# Patient Record
Sex: Male | Born: 1945 | Race: White | Hispanic: No | Marital: Married | Smoking: Former smoker
Health system: Southern US, Community
[De-identification: ages and names within clinical notes are randomized; demographics above are authoritative.]

## PROBLEM LIST (undated history)

## (undated) DIAGNOSIS — F329 Major depressive disorder, single episode, unspecified: Secondary | ICD-10-CM

## (undated) DIAGNOSIS — Z973 Presence of spectacles and contact lenses: Secondary | ICD-10-CM

## (undated) DIAGNOSIS — M1990 Unspecified osteoarthritis, unspecified site: Secondary | ICD-10-CM

## (undated) DIAGNOSIS — G709 Myoneural disorder, unspecified: Secondary | ICD-10-CM

## (undated) DIAGNOSIS — J439 Emphysema, unspecified: Secondary | ICD-10-CM

## (undated) DIAGNOSIS — S82831A Other fracture of upper and lower end of right fibula, initial encounter for closed fracture: Secondary | ICD-10-CM

## (undated) DIAGNOSIS — J45909 Unspecified asthma, uncomplicated: Secondary | ICD-10-CM

## (undated) DIAGNOSIS — S82899A Other fracture of unspecified lower leg, initial encounter for closed fracture: Secondary | ICD-10-CM

## (undated) DIAGNOSIS — F32A Depression, unspecified: Secondary | ICD-10-CM

## (undated) DIAGNOSIS — M4802 Spinal stenosis, cervical region: Secondary | ICD-10-CM

## (undated) DIAGNOSIS — J449 Chronic obstructive pulmonary disease, unspecified: Secondary | ICD-10-CM

## (undated) DIAGNOSIS — F419 Anxiety disorder, unspecified: Secondary | ICD-10-CM

## (undated) DIAGNOSIS — F319 Bipolar disorder, unspecified: Secondary | ICD-10-CM

## (undated) DIAGNOSIS — G500 Trigeminal neuralgia: Secondary | ICD-10-CM

## (undated) HISTORY — DX: Major depressive disorder, single episode, unspecified: F32.9

## (undated) HISTORY — PX: CERVICAL FUSION: SHX112

## (undated) HISTORY — DX: Anxiety disorder, unspecified: F41.9

## (undated) HISTORY — DX: Depression, unspecified: F32.A

## (undated) HISTORY — DX: Myoneural disorder, unspecified: G70.9

## (undated) HISTORY — PX: SPINE SURGERY: SHX786

## (undated) HISTORY — PX: LUMBAR LAMINECTOMY: SHX95

## (undated) HISTORY — DX: Spinal stenosis, cervical region: M48.02

## (undated) HISTORY — DX: Emphysema, unspecified: J43.9

## (undated) HISTORY — PX: COLONOSCOPY: SHX174

## (undated) HISTORY — DX: Unspecified asthma, uncomplicated: J45.909

## (undated) HISTORY — DX: Chronic obstructive pulmonary disease, unspecified: J44.9

---
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
## 1998-09-07 ENCOUNTER — Encounter: Payer: Self-pay | Admitting: Emergency Medicine

## 1998-09-07 ENCOUNTER — Emergency Department (HOSPITAL_COMMUNITY): Admission: EM | Admit: 1998-09-07 | Discharge: 1998-09-07 | Payer: Self-pay | Admitting: Emergency Medicine

## 1998-09-15 ENCOUNTER — Emergency Department (HOSPITAL_COMMUNITY): Admission: EM | Admit: 1998-09-15 | Discharge: 1998-09-15 | Payer: Self-pay | Admitting: Emergency Medicine

## 2003-02-17 ENCOUNTER — Encounter: Payer: Self-pay | Admitting: Neurology

## 2003-02-17 ENCOUNTER — Ambulatory Visit (HOSPITAL_COMMUNITY): Admission: RE | Admit: 2003-02-17 | Discharge: 2003-02-17 | Payer: Self-pay | Admitting: Neurology

## 2005-05-08 ENCOUNTER — Ambulatory Visit (HOSPITAL_COMMUNITY): Admission: RE | Admit: 2005-05-08 | Discharge: 2005-05-09 | Payer: Self-pay | Admitting: Neurosurgery

## 2005-12-13 ENCOUNTER — Encounter: Admission: RE | Admit: 2005-12-13 | Discharge: 2005-12-13 | Payer: Self-pay | Admitting: Emergency Medicine

## 2007-01-02 ENCOUNTER — Encounter: Payer: Self-pay | Admitting: Cardiovascular Disease

## 2008-09-02 ENCOUNTER — Encounter: Admission: RE | Admit: 2008-09-02 | Discharge: 2008-09-02 | Payer: Self-pay | Admitting: Emergency Medicine

## 2008-12-01 ENCOUNTER — Encounter: Payer: Self-pay | Admitting: Cardiovascular Disease

## 2008-12-02 ENCOUNTER — Ambulatory Visit (HOSPITAL_COMMUNITY): Admission: RE | Admit: 2008-12-02 | Discharge: 2008-12-02 | Payer: Self-pay | Admitting: Neurosurgery

## 2009-01-11 ENCOUNTER — Encounter: Payer: Self-pay | Admitting: Cardiovascular Disease

## 2009-01-12 ENCOUNTER — Ambulatory Visit: Payer: Self-pay | Admitting: Cardiovascular Disease

## 2009-01-12 DIAGNOSIS — R9431 Abnormal electrocardiogram [ECG] [EKG]: Secondary | ICD-10-CM | POA: Insufficient documentation

## 2009-01-13 ENCOUNTER — Ambulatory Visit (HOSPITAL_COMMUNITY): Admission: RE | Admit: 2009-01-13 | Discharge: 2009-01-14 | Payer: Self-pay | Admitting: Neurosurgery

## 2009-01-15 DIAGNOSIS — I10 Essential (primary) hypertension: Secondary | ICD-10-CM | POA: Insufficient documentation

## 2009-02-22 ENCOUNTER — Encounter: Admission: RE | Admit: 2009-02-22 | Discharge: 2009-02-22 | Payer: Self-pay | Admitting: Gastroenterology

## 2009-07-08 ENCOUNTER — Encounter: Admission: RE | Admit: 2009-07-08 | Discharge: 2009-07-08 | Payer: Self-pay | Admitting: Emergency Medicine

## 2010-06-11 ENCOUNTER — Encounter: Payer: Self-pay | Admitting: Neurology

## 2010-07-30 ENCOUNTER — Other Ambulatory Visit: Payer: Self-pay | Admitting: Emergency Medicine

## 2010-07-30 ENCOUNTER — Ambulatory Visit
Admission: RE | Admit: 2010-07-30 | Discharge: 2010-07-30 | Disposition: A | Discharge status: Home / Self Care | Payer: 59 | Source: Ambulatory Visit | Attending: Emergency Medicine | Admitting: Emergency Medicine

## 2010-07-30 DIAGNOSIS — S0093XA Contusion of unspecified part of head, initial encounter: Secondary | ICD-10-CM

## 2010-07-30 DIAGNOSIS — R55 Syncope and collapse: Secondary | ICD-10-CM

## 2010-08-25 LAB — DIFFERENTIAL
Basophils Absolute: 0.1 10*3/uL (ref 0.0–0.1)
Basophils Relative: 1 % (ref 0–1)
Eosinophils Relative: 2 % (ref 0–5)
Lymphs Abs: 1.8 10*3/uL (ref 0.7–4.0)
Monocytes Absolute: 0.6 10*3/uL (ref 0.1–1.0)

## 2010-08-25 LAB — CBC
Hemoglobin: 15.8 g/dL (ref 13.0–17.0)
MCHC: 34.9 g/dL (ref 30.0–36.0)
RBC: 4.54 MIL/uL (ref 4.22–5.81)
WBC: 6.1 10*3/uL (ref 4.0–10.5)

## 2010-08-25 LAB — PROTIME-INR
INR: 1.1 (ref 0.00–1.49)
Prothrombin Time: 14.2 seconds (ref 11.6–15.2)

## 2010-08-25 LAB — URINALYSIS, ROUTINE W REFLEX MICROSCOPIC
Ketones, ur: 15 mg/dL — AB
Nitrite: NEGATIVE
Protein, ur: NEGATIVE mg/dL
Urobilinogen, UA: 1 mg/dL (ref 0.0–1.0)
pH: 7 (ref 5.0–8.0)

## 2010-08-25 LAB — COMPREHENSIVE METABOLIC PANEL
ALT: 83 U/L — ABNORMAL HIGH (ref 0–53)
AST: 69 U/L — ABNORMAL HIGH (ref 0–37)
Albumin: 4.3 g/dL (ref 3.5–5.2)
BUN: 8 mg/dL (ref 6–23)
Chloride: 105 mEq/L (ref 96–112)
Creatinine, Ser: 0.83 mg/dL (ref 0.4–1.5)
GFR calc non Af Amer: >60 mL/min (ref >60)
Glucose, Bld: 99 mg/dL (ref 70–99)
Potassium: 4.8 mEq/L (ref 3.5–5.1)
Total Bilirubin: 1.4 mg/dL — ABNORMAL HIGH (ref 0.3–1.2)
Total Protein: 7.3 g/dL (ref 6.0–8.3)

## 2010-08-25 LAB — APTT: aPTT: 26 seconds (ref 24–37)

## 2010-08-26 LAB — DIFFERENTIAL
Basophils Relative: 1 % (ref 0–1)
Lymphocytes Relative: 32 % (ref 12–46)
Lymphs Abs: 2.1 10*3/uL (ref 0.7–4.0)
Monocytes Relative: 8 % (ref 3–12)
Neutro Abs: 3.8 10*3/uL (ref 1.7–7.7)

## 2010-08-26 LAB — CBC
Hemoglobin: 15.4 g/dL (ref 13.0–17.0)
MCV: 98.5 fL (ref 78.0–100.0)
Platelets: 168 10*3/uL (ref 150–400)
WBC: 6.5 10*3/uL (ref 4.0–10.5)

## 2010-08-26 LAB — COMPREHENSIVE METABOLIC PANEL
ALT: 86 U/L — ABNORMAL HIGH (ref 0–53)
AST: 118 U/L — ABNORMAL HIGH (ref 0–37)
Albumin: 4.1 g/dL (ref 3.5–5.2)
Albumin: 4.1 g/dL (ref 3.5–5.2)
Alkaline Phosphatase: 58 U/L (ref 39–117)
BUN: 7 mg/dL (ref 6–23)
BUN: 10 mg/dL (ref 6–23)
CO2: 27 mEq/L (ref 19–32)
Calcium: 9.5 mg/dL (ref 8.4–10.5)
Creatinine, Ser: 0.65 mg/dL (ref 0.4–1.5)
GFR calc Af Amer: >60 mL/min (ref >60)
Glucose, Bld: 106 mg/dL — ABNORMAL HIGH (ref 70–99)
Potassium: 4.3 mEq/L (ref 3.5–5.1)
Sodium: 141 mEq/L (ref 135–145)
Total Bilirubin: 1 mg/dL (ref 0.3–1.2)
Total Protein: 7.2 g/dL (ref 6.0–8.3)

## 2010-08-26 LAB — PROTIME-INR
INR: 1.1 (ref 0.00–1.49)
Prothrombin Time: 14.7 seconds (ref 11.6–15.2)

## 2010-08-26 LAB — URINALYSIS, ROUTINE W REFLEX MICROSCOPIC
Bilirubin Urine: NEGATIVE
Hgb urine dipstick: NEGATIVE
Nitrite: NEGATIVE
Specific Gravity, Urine: 1.029 (ref 1.005–1.030)
Urobilinogen, UA: 1 mg/dL (ref 0.0–1.0)

## 2010-10-02 NOTE — H&P (Signed)
NAMECULVER, FEIGHNER                 ACCOUNT NO.:  [id number]   MEDICAL RECORD NO.:  [id number]          PATIENT TYPE:  OIB   LOCATION:  3006                         FACILITY:  [facility]   PHYSICIAN:  Payton Doughty, M.D.      DATE OF BIRTH:  [birth year]   DATE OF ADMISSION:  01/13/2009  DATE OF DISCHARGE:                              HISTORY & PHYSICAL   ADMISSION DIAGNOSIS:  Spondylosis and spinal stenosis at C3-4 and C4-5.   SURGEON:  Payton Doughty, MD   BODY OF TEXT:  A very nice 65 year old gentleman right-handed who had  prior operation by Dr. Newell Coral on his back who developed pain in his  neck, as well as clumsiness in his hands and cramping of his hands.  MRI  shows spondylosis with cord compression at C3-4 and C4-5.  He was  admitted before, had some difficulties with liver enzymes that has  straightened out now.  He is now admitted for an anterior decompression  and fusion at C3-4 and C4-5.   MEDICAL HISTORY:  Remarkable for trigeminal neuralgia that he uses  Lyrica for an episodic basis.   MEDICINES:  Robaxin 3-4 times a day and Lyrica.   ALLERGIES:  None.   SURGICAL HISTORY:  Lumbar diskectomy by Dr. Newell Coral.   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   FAMILY HISTORY:  Mother is 53, in poor health with osteoporosis.  Father  is deceased.   REVIEW OF SYSTEMS:  Remarkable for wearing glasses, COPD, arm and leg  weakness.  HEENT:  Within normal limits.  He has limited range of motion  in his neck without Lhermitte's.  CHEST:  Clear.  CARDIAC:  Regular rate  and rhythm.  ABDOMEN:  Soft, nontender, no hepatosplenomegaly.  EXTREMITIES:  Without clubbing or cyanosis.  GU:  Deferred.  Peripheral  pulses are good.  NEUROLOGIC:  He is awake, alert and oriented.  Cranial  nerves appear to function normally.  Motor exam is 5/5 in the upper  extremities with possible exception in interossei which was 5-/5,  reflexes are  1 at the biceps and triceps, absent at the brachioradialis.  Hoffman's is mildly positive bilaterally.  Lower extremities do not  demonstrate any myelopathy.   MR shows degenerative changes at C3-4, C4-5 and C5-6.  C5-6 and C6-7  were less affected, on C3-4 there was ample CSF around the cord.  On C5-  6, there was an extensive degenerative change with posteriorly  protruding osteophyte and lateral recess narrowing.   CLINICAL IMPRESSION:  Cervical spondylosis and early myelopathy.  I  think the most offending levels are C4-5 and C5-6 and the plan is for an  anterior decompression and fusion at C4-5 and C5-6.  The risks and  benefits have been discussed with him and he wished to proceed.   .           ______________________________  Payton Doughty, M.D.     MWR/MEDQ  D:  01/13/2009  T:  01/14/2009  Job:  [id number]

## 2010-10-02 NOTE — Op Note (Signed)
NAMECHRISTYAN, Gabriel Richardson                 ACCOUNT NO.:  [id number]   MEDICAL RECORD NO.:  [id number]          PATIENT TYPE:  OIB   LOCATION:  3006                         FACILITY:  [facility]   PHYSICIAN:  Payton Doughty, M.D.      DATE OF BIRTH:  1946-03-05   DATE OF PROCEDURE:  01/13/2009  DATE OF DISCHARGE:                               OPERATIVE REPORT   PREOPERATIVE DIAGNOSIS:  Spondylosis C4-5 and C5-6.   POSTOPERATIVE DIAGNOSIS:  Spondylosis C4-5 and C5-6.   OPERATIVE PROCEDURE:  C4-5 and C5-6 anterior cervical decompression and  fusion with an Aviator plate.   SURGEON:  Payton Doughty, MD.   ANESTHESIA:  General endotracheal.   PREPARATION:  Prepped and draped with alcohol wipe.   COMPLICATIONS:  None.   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  Following shave, prep and drape in the usual sterile fashion,  skin was incised in midline in medial border of sternocleidomastoid  muscle on the left side.  The platysma was identified, elevated,  divided, and undermined.  Sternocleidomastoid was identified, and medial  dissection revealed the carotid artery tracked laterally to the left.  Trachea and esophagus tracked laterally to the right exposing the bones  of the anterior cervical spine.  Marker was placed.  Intraoperative x-  ray obtained to confirm correctness of the level.  Having confirmed  correctness of level, diskectomy was carried out at C4-5 under gross  observation.  The operating microscope was then brought in and we used  microdissection technique to remove the remaining disk and decompress  the anterior epidural space which is where his cord compression was  occurring.  Following complete decompression, an 8-mm bone graft  was  fashioned in the patellar allograft and tapped into place.  At C5-6, the  joint was ankylosed, could be partly opened but the posterior part of it  could not be identified without wrecking the endplates of vertebral  bodies.  Because he did not have much compression there, it was decided  to simply place a bone graft which was done.  A 34-mm plate was then  placed across both levels and held in place with 12-mm screws, two in  C4, two in C5, two in C6.  Intraoperative x-  ray showed good placement of bone graft, plate, and screws.  Successive  layers of 3-0 Vicryl and 4-0 Vicryl were sed to close.  Benzoin and  Steri-Strips were placed, made occlusive with Telfa and OpSite.  The  patient returned to recovery room in good condition.           ______________________________  Payton Doughty, M.D.     MWR/MEDQ  D:  01/13/2009  T:  01/14/2009  Job:  [id number]

## 2010-10-05 NOTE — Op Note (Signed)
Gabriel Richardson, Gabriel Richardson                 ACCOUNT NO.:  [id number]   MEDICAL RECORD NO.:  [id number]          PATIENT TYPE:  OIB   LOCATION:  2550                         FACILITY:  [facility]   PHYSICIAN:  Hewitt Shorts, M.D.DATE OF BIRTH:  Jun 27, 1945   DATE OF PROCEDURE:  05/08/2005  DATE OF DISCHARGE:                                 OPERATIVE REPORT   PREOPERATIVE DIAGNOSIS:  Left L5-S1 lumbar disc herniation, lumbar  spondylosis, lumbar degenerative disc disease, and lumbar radiculopathy.   POSTOPERATIVE DIAGNOSIS:  Left L5-S1 lumbar disc herniation, lumbar spondylosis, lumbar degenerative  disc disease, and lumbar radiculopathy.   PROCEDURE:  Left L5-S1 METRx microdiscectomy with microdissection.   SURGEON:  Hewitt Shorts, M.D.   ASSISTANT:  Stefani Dama, M.D.   ANESTHESIA:  General endotracheal anesthesia.   INDICATIONS FOR PROCEDURE:  The patient is a 65 year old man who presented  with left lumbar radiculopathy with left dorsiflexor and left extensor  hallicis longus weakness.  He was found to have a left L5-S1 lumbar disc  herniation with a fragment that migrated rostrally behind the body of L5  compressing the exiting left L5 nerve root.  The decision was made to  proceed with discectomy.   PROCEDURE:  The patient was brought to the operating room and placed under  general endotracheal anesthesia.  The patient was turned to the prone  position and the lumbar region was prepped with Betadine solution and draped  in a sterile fashion.  Using C-arm fluoroscopic guidance, the approach was  localized with AP and lateral trajectories, then a left paramedian incision  was made and carried down through the subcutaneous tissue to the lumbar  fascia which was incised.  Then, a K-wire was carefully passed with C-arm  fluoroscopic guidance to the L5 lamina.  A series of dissecting tubes were  passed over the K-wire and dissection was carried out after the K-wires were  removed with the tubes over the L5 and S1 lamina.  Once the tubes were fully  sized up to the largest dilator, the retraction tube was passed over that  and secured in a fixed position.  Then, operating microscope was draped and  brought onto the field to provide instrument magnification, illumination,  and visualization and the remainder of the decompression performed using  microdissection and microsurgical technique.  Residual muscle and fascial  tissue was carefully removed exposing the lamina.  The facet on that side  was hypertrophic and then a laminotomy and medial facetectomy was performed  removing both the bone as well as spondylitic overgrowth.  We identified the  ligamentum flavum and this was carefully removed and we identified the  thecal sac.  We then carefully retracted the thecal sac medially and  identified the annulus of the disc and then dissected further rostrally  extending the laminotomy rostrally so that we were able to expose the disc  herniation as well as the L5 nerve root as it exited in the neural foramen.  We then incised the remaining ligament tissue and the free fragment of disc  herniation was carefully mobilized using  a variety of micro-hooks and ball  dissectors.  As the fragment was removed, good decompression of the thecal  sac and nerve root was achieved.  We then performed a laminotomy at the L4-  L5 level to examine the L5 nerve root more proximally.  It exited freely  into the neural foramen.  We did not find any compression at the L4-L5 level  and it was felt that the left L5 nerve root was thoroughly decompressed from  its origin at the thecal sac at the L4-L5 level to more distally within the  L5 nerve root foramen out to the L5-S1 disc level.  The wound was then  irrigated with Bacitracin solution, checked for hemostasis which was  established and confirmed.  We instilled 1 mL of Fentanyl and 40 mg Depo-  Medrol into the epidural space at the  L5-S1 level and then proceeded with  closure.  The deep fascia was closed with interrupted undyed 3-0 Vicryl  suture, the subcutaneous and subcuticular layer was closed with interrupted  inverted undyed 3-0 Vicryl sutures, and the skin was reapproximated with  Dermabond.  The patient tolerated the procedure well.  Estimated blood loss  was 25 mL.  Sponge and needle counts were correct.  Following surgery, the  patient was turned back to the supine position, reversed from the  anesthetic, extubated, and transferred to the recovery room for further  care.      Hewitt Shorts, M.D.  Electronically Signed     RWN/MEDQ  D:  05/08/2005  T:  05/10/2005  Job:  [id number]

## 2011-01-18 ENCOUNTER — Ambulatory Visit
Admission: RE | Admit: 2011-01-18 | Discharge: 2011-01-18 | Disposition: A | Discharge status: Home / Self Care | Payer: 59 | Source: Ambulatory Visit | Attending: Emergency Medicine | Admitting: Emergency Medicine

## 2011-01-18 ENCOUNTER — Other Ambulatory Visit: Payer: Self-pay | Admitting: Emergency Medicine

## 2011-01-18 DIAGNOSIS — R911 Solitary pulmonary nodule: Secondary | ICD-10-CM

## 2011-01-18 MED ORDER — IOHEXOL 300 MG/ML  SOLN
75.0000 mL | Freq: Once | INTRAMUSCULAR | Status: AC | PRN
  Administered 2011-01-18: 75 mL via INTRAVENOUS

## 2012-05-27 ENCOUNTER — Other Ambulatory Visit: Payer: Self-pay | Admitting: Neurology

## 2012-05-27 DIAGNOSIS — R9089 Other abnormal findings on diagnostic imaging of central nervous system: Secondary | ICD-10-CM

## 2012-06-02 ENCOUNTER — Ambulatory Visit
Admission: RE | Admit: 2012-06-02 | Discharge: 2012-06-02 | Disposition: A | Discharge status: Home / Self Care | Payer: 59 | Source: Ambulatory Visit | Attending: Neurology | Admitting: Neurology

## 2012-06-02 VITALS — BP 130/80 | HR 98

## 2012-06-02 DIAGNOSIS — R9089 Other abnormal findings on diagnostic imaging of central nervous system: Secondary | ICD-10-CM

## 2012-06-02 LAB — CSF CELL COUNT WITH DIFFERENTIAL
RBC Count, CSF: 0 cu mm
Tube #: 4

## 2012-06-02 LAB — GLUCOSE, CSF: Glucose, CSF: 63 mg/dL (ref 43–76)

## 2012-06-02 NOTE — Progress Notes (Signed)
One tube blood drawn for procedure from right [facility] space without difficulty; site unremarkable.  dd

## 2012-06-05 LAB — CNS IGG SYNTHESIS RATE, CSF+BLOOD
IgG Index, CSF: 0.43 (ref <0.66)
IgG, Serum: 1420 mg/dL (ref 694–1618)
MS CNS IgG Synthesis Rate: -5.9 mg/24 h (ref <=3.3)

## 2012-10-09 ENCOUNTER — Telehealth: Payer: Self-pay | Admitting: Neurology

## 2012-10-13 NOTE — Telephone Encounter (Signed)
Patient called and left message that he had refills for propranolol and does not need them. He does need to be reassigned a new physician, as Dr. Sandria Manly was his physician. I called patient back to acknowledge that we got his message. I will forward message to [facility] who will assign him a new physician and we will notify him of who that will be once assigned. Raenette Rover Schwalbach BS RN/SY

## 2012-10-14 NOTE — Telephone Encounter (Signed)
Andrey Campanile can you please tell us who to assign this patient with and we would be happy to schedule them with the physician you choose.  Thanks, [employer]

## 2012-10-15 ENCOUNTER — Telehealth: Payer: Self-pay | Admitting: Neurology

## 2012-10-15 NOTE — Telephone Encounter (Signed)
Given to scheduling for appt, then may refill.  (Dr. Hosie Poisson)

## 2012-10-29 ENCOUNTER — Telehealth: Payer: Self-pay

## 2012-10-29 NOTE — Telephone Encounter (Signed)
Pt is needing to talk with someone about being referred to an eye doctor   Best number [phone]

## 2012-10-31 NOTE — Telephone Encounter (Signed)
Patient is coming in Monday to see Dr. Cleta Alberts.

## 2012-11-02 ENCOUNTER — Other Ambulatory Visit: Payer: Self-pay | Admitting: Emergency Medicine

## 2012-11-02 ENCOUNTER — Ambulatory Visit (INDEPENDENT_AMBULATORY_CARE_PROVIDER_SITE_OTHER): Payer: 59 | Admitting: Emergency Medicine

## 2012-11-02 VITALS — BP 138/84 | HR 79 | Temp 98.3°F | Resp 16 | Ht 69.0 in | Wt 215.0 lb

## 2012-11-02 DIAGNOSIS — H02409 Unspecified ptosis of unspecified eyelid: Secondary | ICD-10-CM

## 2012-11-02 DIAGNOSIS — F32A Depression, unspecified: Secondary | ICD-10-CM | POA: Insufficient documentation

## 2012-11-02 DIAGNOSIS — J45909 Unspecified asthma, uncomplicated: Secondary | ICD-10-CM | POA: Insufficient documentation

## 2012-11-02 DIAGNOSIS — H02402 Unspecified ptosis of left eyelid: Secondary | ICD-10-CM

## 2012-11-02 DIAGNOSIS — F329 Major depressive disorder, single episode, unspecified: Secondary | ICD-10-CM | POA: Insufficient documentation

## 2012-11-02 DIAGNOSIS — J441 Chronic obstructive pulmonary disease with (acute) exacerbation: Secondary | ICD-10-CM

## 2012-11-02 DIAGNOSIS — R0602 Shortness of breath: Secondary | ICD-10-CM

## 2012-11-02 LAB — POCT SEDIMENTATION RATE: POCT SED RATE: 31 mm/hr — AB (ref 0–22)

## 2012-11-02 LAB — POCT CBC
Lymph, poc: 2.3 (ref 0.6–3.4)
MCHC: 31.9 g/dL (ref 31.8–35.4)
MPV: 9.8 fL (ref 0–99.8)
POC Granulocyte: 3.3 (ref 2–6.9)
POC LYMPH PERCENT: 37.4 %L (ref 10–50)
POC MID %: 8 %M (ref 0–12)
RDW, POC: 14.6 %

## 2012-11-02 MED ORDER — BUDESONIDE 180 MCG/ACT IN AEPB: 1.0000 | INHALATION_SPRAY | Freq: Two times a day (BID) | RESPIRATORY_TRACT | Status: DC

## 2012-11-02 MED ORDER — ALBUTEROL SULFATE HFA 108 (90 BASE) MCG/ACT IN AERS: 2.0000 | INHALATION_SPRAY | RESPIRATORY_TRACT | Status: DC | PRN

## 2012-11-02 NOTE — Progress Notes (Signed)
  Subjective:    Patient ID: Gabriel Richardson, male    DOB: [birth year], 67 y.o.   MRN: [id number]  HPI 67 year old male presents with:   Left eye drainage x 7-10 days.   10 days ago left eye began to tear up, then a couple days later left eye lid began to droop.  Drooping at times is worse than other times, no dryness. 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 patient felt short of breath and needs a refill on his inhaler.   Eye exam at [facility] 2 months ago, little change from eye exam 3 years previous.   Recommended a yearly physical. Patient agrees to schedule an appointment  Review of Systems     Objective:   Physical Exam there is a mild ptosis of the left eye. The rest of the cranial nerves are intact. There are no other cranial nerve palsies noted. Chest exam revealed diminished breath sounds in the bases. Heart regular rate no murmurs.        Assessment & Plan:  Patient with long-standing COPD. He has increasing chest tightness. We'll need to get him back on his inhalers. I encouraged him to make an appointment to see his lung specialist. He has a ptosis of the left will make an appointment for him to see neurology for evaluation of this. EKG was normal sinus rhythm.

## 2012-11-03 NOTE — Telephone Encounter (Signed)
Needs CPE, this med has not been rx'd since 2012

## 2012-11-06 ENCOUNTER — Institutional Professional Consult (permissible substitution): Payer: 59 | Admitting: Internal Medicine

## 2012-11-12 ENCOUNTER — Encounter: Payer: Self-pay | Admitting: Emergency Medicine

## 2012-11-18 ENCOUNTER — Encounter: Payer: Self-pay | Admitting: Internal Medicine

## 2012-11-30 ENCOUNTER — Ambulatory Visit: Payer: Self-pay | Admitting: Neurology

## 2012-12-01 ENCOUNTER — Institutional Professional Consult (permissible substitution): Payer: 59 | Admitting: Internal Medicine

## 2012-12-02 ENCOUNTER — Ambulatory Visit: Payer: Self-pay | Admitting: Neurology

## 2012-12-03 ENCOUNTER — Encounter: Payer: Self-pay | Admitting: Internal Medicine

## 2012-12-03 ENCOUNTER — Other Ambulatory Visit: Payer: Self-pay

## 2012-12-03 ENCOUNTER — Ambulatory Visit (INDEPENDENT_AMBULATORY_CARE_PROVIDER_SITE_OTHER): Payer: 59 | Admitting: Internal Medicine

## 2012-12-03 VITALS — BP 144/84 | HR 77 | Temp 98.4°F | Ht 68.0 in | Wt 220.0 lb

## 2012-12-03 DIAGNOSIS — R0602 Shortness of breath: Secondary | ICD-10-CM

## 2012-12-03 DIAGNOSIS — J441 Chronic obstructive pulmonary disease with (acute) exacerbation: Secondary | ICD-10-CM

## 2012-12-03 MED ORDER — BUDESONIDE 180 MCG/ACT IN AEPB: 1.0000 | INHALATION_SPRAY | Freq: Two times a day (BID) | RESPIRATORY_TRACT | Status: DC

## 2012-12-03 MED ORDER — ALBUTEROL SULFATE HFA 108 (90 BASE) MCG/ACT IN AERS: 2.0000 | INHALATION_SPRAY | RESPIRATORY_TRACT | Status: DC | PRN

## 2012-12-03 NOTE — Patient Instructions (Addendum)
Consistently use pulmicort   2 puffs first thing in am and then another 2 puffs about 12 hours later ( to keep airway inflammation at a minimum)   Only use your albuterol as a rescue medication to be used if you can't catch your breath by resting or doing a relaxed purse lip breathing pattern. The less you use it, the better it will work when you need it. Ok to use up to 2 puffs every 4 hours  - don't leave home without it, stop the propranolol if needing a lot more albuterol than you are.  The goal with albuterol is less than twice twice weekly  Please remember to go to the x-ray department downstairs for your tests - we will call you with the results when they are available.    Please schedule a follow up office visit in 6 [place], call sooner if needed with pft's on return

## 2012-12-03 NOTE — Progress Notes (Signed)
  Subjective:    Patient ID: Gabriel Richardson, male    DOB: October 30, 1945   MRN: [id number]  HPI  47 yowm   with onset of breathing problems in mid 1990 that resolved when quit smoking 1999 with pfts nl here (records requested from warehouse) and recurrent sob p exposure in June 2014 better p rx with symbicort and referred 12/03/2012 to pulmonay clinic.  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 No unusual exp hx or h/o childhood pna/ asthma or knowledge of premature birth.   Sleeping ok without nocturnal  or early am exacerbation  of respiratory  c/o's or need for noct saba. Also denies any obvious fluctuation of symptoms with weather or environmental changes or other aggravating or alleviating factors except as outlined above     Review of Systems  Constitutional: Negative for fever, chills, activity change, appetite change and unexpected weight change.  HENT: Negative for congestion, sore throat, rhinorrhea, sneezing, trouble swallowing, dental problem, voice change and postnasal drip.   Eyes: Negative for visual disturbance.  Respiratory: Positive for shortness of breath. Negative for cough and choking.   Cardiovascular: Negative for chest pain and leg swelling.  Gastrointestinal: Negative for nausea, vomiting and abdominal pain.  Genitourinary: Negative for difficulty urinating.  Musculoskeletal: Negative for arthralgias.  Skin: Negative for rash.  Psychiatric/Behavioral: Negative for behavioral problems and confusion.       Objective:   Physical Exam  Obese amb wm nad Wt Readings from Last 3 Encounters:   12/03/12 220 lb (99.791 kg)  11/02/12 215 lb (97.523 kg)  01/12/09 189 lb (85.73 kg)     HEENT mild turbinate edema.  Oropharynx no thrush or excess pnd or cobblestoning.  No JVD or cervical adenopathy. Mild accessory muscle hypertrophy. Trachea midline, nl thryroid. Chest was hyperinflated by percussion with diminished breath sounds and moderate increased exp time without wheeze. Hoover sign positive at mid inspiration. Regular rate and rhythm without murmur gallop or rub or increase P2 or edema.  Abd: no hsm, nl excursion. Ext warm without cyanosis or clubbing.     CXR  12/03/2012 :  Did not go for cxr as requested      Assessment & Plan:

## 2012-12-05 NOTE — Assessment & Plan Note (Addendum)
Spirometry 11/18/2012 FEV1  49% with ratio 66   Presently improved with f/v loop not typical of asthma but symptoms better and saba use less so ok to continue pulmocort 2 bid for now pending return and review of previous evaluation here  Goals of asthma and approp use of inhalers reviewed in detail     Each maintenance medication was reviewed in detail including most importantly the difference between maintenance and as needed and under what circumstances the prns are to be used.  Please see instructions for details which were reviewed in writing and the patient given a copy.

## 2012-12-14 ENCOUNTER — Encounter: Payer: Self-pay | Admitting: Neurology

## 2012-12-14 ENCOUNTER — Ambulatory Visit (INDEPENDENT_AMBULATORY_CARE_PROVIDER_SITE_OTHER): Payer: 59 | Admitting: Neurology

## 2012-12-14 VITALS — BP 145/83 | HR 72 | Ht 68.0 in | Wt 218.0 lb

## 2012-12-14 DIAGNOSIS — H02402 Unspecified ptosis of left eyelid: Secondary | ICD-10-CM

## 2012-12-14 DIAGNOSIS — H02409 Unspecified ptosis of unspecified eyelid: Secondary | ICD-10-CM | POA: Insufficient documentation

## 2012-12-14 DIAGNOSIS — R259 Unspecified abnormal involuntary movements: Secondary | ICD-10-CM

## 2012-12-14 DIAGNOSIS — R251 Tremor, unspecified: Secondary | ICD-10-CM

## 2012-12-14 NOTE — Progress Notes (Signed)
[facility]  Provider:  Dr Hosie Richardson Referring Provider: Collene Gobble, MD Primary Care Physician:  Gabriel Edin, MD  Chief Complaint  Patient presents with  . Left Ptosis    # 16 New Patient    HPI:  Gabriel Richardson is a 67 y.o. male here as a referral from Gabriel. Cleta Richardson for new onset L eye-lid ptosis.  He reports symptoms acutely around 8 Coote ago. Initially noted excessive tearing up/mucus in his L eye and then noted a drooping of the lid. Episode lasted a few hours and was tied to an allergic response from using spray paint. Denies any associated blurry vision, change in loss, eye pain, no neck pain/HA, no weakness or sensory changes. Ptosis resolved after a few hours. Since the initial episode he has had recurrent episodes of ptosis around 1x per week, typically lasting around 1-2 hours. Againt denies any associated symptoms. Events typically occur later in the day and self-resolve. No trauma, neck manipulation associated with onset of event.   Has a history of hand tremor for which he was followed by Gabriel Richardson and is well controlled on beta-blocker. Has had multiple brain MRIs completed showing white matter changes consistent with questionalbe MS diagnosis. Reports having had a LP done which ruled out MS.   71yr history of smoking. Has hx of COPD, asthma for which he is followed by pulmonology.   Review of Systems: Out of a complete 14 system review, the patient complains of only the following symptoms, and all other reviewed systems are negative. + for Fatigue, shortness of breath, tremor, depression  History   Social History  . Marital Status: Married    Spouse Name: Gabriel Richardson    Number of Children: 2  . Years of Education: BS   Occupational History  .      Works for himself   Social History Main Topics  . Smoking status: Former Smoker -- 1.50 packs/day for 25 years    Types: Cigarettes    Quit date: 05/20/1997  . Smokeless tobacco: Never Used  . Alcohol Use:  1.2 oz/week    2 Shots of liquor per week     Comment: Two shots daily  . Drug Use: No  . Sexually Active: Yes    Birth Control/ Protection: None   Other Topics Concern  . Not on file   Social History Narrative   Patient lives at home with his wife Gabriel Richardson. Patient has a college education. B.S. Patient is self employed.   Right handed.   Caffeine- one cup    No family history on file.  Past Medical History  Diagnosis Date  . Depression   . Anxiety   . COPD (chronic obstructive pulmonary disease)     Past Surgical History  Procedure Laterality Date  . Spine surgery      Current Outpatient Prescriptions  Medication Sig Dispense Refill  . albuterol (PROVENTIL HFA;VENTOLIN HFA) 108 (90 BASE) MCG/ACT inhaler Inhale 2 puffs into the lungs every 4 (four) hours as needed for wheezing (cough, shortness of breath or wheezing.).  3 Inhaler  1  . ALPRAZolam (XANAX) 0.5 MG tablet Take 0.5 mg by mouth at bedtime as needed for sleep.      . budesonide (PULMICORT) 180 MCG/ACT inhaler Inhale 1-2 puffs into the lungs 2 (two) times daily.  3 Inhaler  3  . lamoTRIgine (LAMICTAL) 200 MG tablet Take 200 mg by mouth daily.      Marland Kitchen omeprazole (PRILOSEC) 20 MG  capsule Take 20 mg by mouth daily.      . propranolol ER (INDERAL LA) 60 MG 24 hr capsule Take 60 mg by mouth daily.      . sildenafil (VIAGRA) 50 MG tablet Take 1 tablet (50 mg total) by mouth as needed for erectile dysfunction. NEED CPE!  10 tablet  0  . zolpidem (AMBIEN) 5 MG tablet Take 10 mg by mouth at bedtime as needed for sleep.       No current facility-administered medications for this visit.    Allergies as of 12/14/2012  . (No Known Allergies)    Vitals: BP 145/83  Pulse 72  Ht 5\' 8"  (1.727 m)  Wt 218 lb (98.884 kg)  BMI 33.15 kg/m2 Last Weight:  Wt Readings from Last 1 Encounters:  12/14/12 218 lb (98.884 kg)   Last Height:   Ht Readings from Last 1 Encounters:  12/14/12 5\' 8"  (1.727 m)     Physical  exam: Exam: Gen: NAD, conversant Eyes: anicteric sclerae, moist conjunctivae HENT: [place] Neck: Trachea midline; supple,  Lungs: CTA, no wheezing, rales, rhonic                          CV: RRR, no MRG Abdomen: Soft, non-tender;  Extremities: No peripheral edema  Skin: Normal temperature, no rash,  Psych: Appropriate affect, pleasant  Neuro: MS: AA&Ox3, appropriately interactive, normal affect   Attention: WORLD backwards  Speech: fluent w/o paraphasic error  Memory: good recent and remote recall  CN: PERRL, pupils symmetric, EOMI no nystagmus, mild ptosis L eyelid, full strength with eye closure and opening, no noted eye/lid fatigue with sustained up gaze for 60s,  sensation intact to LT V1-V3 bilat, face symmetric, no weakness, hearing grossly intact, palate elevates symmetrically, shoulder shrug 5/5 bilat,  tongue protrudes midline, no fasiculations noted. Decreased ROM of neck rotation L>R  Motor: normal bulk and tone Strength: 5/5  In all extremities  Coord: rapid alternating and point-to-point (FNF, HTS) movements intact. Mild bilat intention tremor  Reflexes: symmetrical, bilat downgoing toes  Sens: LT intact in all extremities  Gait: posture, stance, stride and arm-swing normal. Tandem gait intact. Able to walk on heels and toes. Romberg absent.   Assessment:  After physical and neurologic examination, review of laboratory studies, imaging, neurophysiology testing and pre-existing records, assessment will be reviewed on the problem list.  Plan:  Treatment plan and additional workup will be reviewed under Problem List.  Gabriel Richardson is a pleasant 66y/o gentleman with hx of essential tremor, well controlled with BB, presenting for initial evaluation of L eye-lid ptosis. Symptoms are intermittent, occuring roughly once per week, lasting 1-2 hours and are self-limiting. He denies any associated symptoms though does note some fatigue and symptoms are occuring more in the  evening. Physical exam + for mild L eye-lid ptosis, with all other CNs intact. Mild bilat intention tremor but normal strength/sensation.  1) Ptosis -unclear etiology, differential would include aponeurotic ptosis vs allergic/eye-lid edema vs Horners vs Myasthenia Gravis vs possible demyelinating disease/MS. Patients strong smoking hx/COPD puts him at risk for possible lung mass which would make Horners a concern, though lack of other exam findings points away from this. MG is a possibility with symptoms fluctuating, occuring with fatigue and worse in the evening though unilateral ptosis makes it less likely.   The differential and possible workups were discussed extensively with the patient. At this time he does not wish to proceed with any  extensive imaging/lab workup. I counseled him that at a minimum I would suggest getting a CXR to r/o upper apical lung mass and getting blood work to test for MG. Counseled him that we could also consider MRI/A head/neck to rule out other causes of Horners/check for possible active MS lesion. He was explained the risks and benefits and does not wish to proceed with imaging at this time. Reports he will get a CXR with his pulmonologist. Will plan to carefully monitor at this time  2) Tremor -clinially stable -continue beta blocker, patient is happy on this regimen but with hx of asthma need to closely monitor  -follow up as needed  A total of 30 minutes was spent in with this patient. Over half this time was spent on counseling patient on the diagnosis and different therapeutic options available.  Counseled patient on potential causes of ptosis, the workup indicated and the benefits/risks of not going ahead with a workup. Patient expresses understanding of all this. All his questions were fully answered.

## 2012-12-14 NOTE — Patient Instructions (Signed)
Overall you are doing fairly well but I do want to suggest a few things today:   Remember to drink plenty of fluid, eat healthy meals and do not skip any meals. Try to eat protein with a every meal and eat a healthy snack such as fruit or nuts in between meals. Try to keep a regular sleep-wake schedule and try to exercise daily, particularly in the form of walking, 20-30 minutes a day, if you can.   As far as your medications are concerned, I would like to suggest you continue on the beta blocker for your essential tremor. Beta-blockers can worsen asthma/copd so please be cautious with use of this medicaion  As far as diagnostic testing: I suggest you at minimum get a chest x-ray, you stated you plan to get this with your pulmonary doctor. You may also benefit from getting blood work to check for myasthenia gravis and imaging to rule out potential causes of ptosis in your brain and vessels.   I would like to see you back in an as needed basis, sooner if we need to. Please call us with any interim questions, concerns, problems, updates or refill requests.   Please also call us for any test results so we can go over those with you on the phone.  My clinical assistant and will answer any of your questions and relay your messages to me and also relay most of my messages to you.   Our phone number is [phone]. We also have an after hours call service for urgent matters and there is a physician on-call for urgent questions. For any emergencies you know to call 911 or go to the nearest emergency room

## 2012-12-15 ENCOUNTER — Encounter: Payer: Self-pay | Admitting: Emergency Medicine

## 2013-01-14 ENCOUNTER — Ambulatory Visit: Payer: 59 | Admitting: Internal Medicine

## 2013-01-20 ENCOUNTER — Telehealth: Payer: Self-pay

## 2013-01-20 DIAGNOSIS — R251 Tremor, unspecified: Secondary | ICD-10-CM

## 2013-01-20 DIAGNOSIS — H02402 Unspecified ptosis of left eyelid: Secondary | ICD-10-CM

## 2013-01-20 NOTE — Telephone Encounter (Signed)
Which are the doctors is he seeing in that office at this time

## 2013-01-20 NOTE — Telephone Encounter (Signed)
Pt was referred to [facility] and has seen him several time. Dr.Love retired and now pt has been seeing another doctor who he does not care for.  Wants to know who Dr.Daub would refer his own family to.  Call back at [phone].

## 2013-01-21 NOTE — Telephone Encounter (Signed)
I would advise that we get him an appointment at [facility] neurological for evaluation at [facility] neurological

## 2013-01-21 NOTE — Telephone Encounter (Signed)
I have put in referral 

## 2013-01-21 NOTE — Telephone Encounter (Signed)
He has seen Dr Hosie Poisson, there is a note in Epic, the office at [facility] will not let patients change doctors within their practice, once they are established. If you want a different Neurologist, we will need to send out ([place] or [place])

## 2013-01-25 NOTE — Telephone Encounter (Signed)
I have called patient to advise. He was seen there before, he does not want to go back there, since all his information is in Epic. He will call the office and see if they will let him change physicians

## 2013-02-10 ENCOUNTER — Ambulatory Visit: Payer: 59 | Admitting: Internal Medicine

## 2013-02-18 ENCOUNTER — Ambulatory Visit: Payer: 59 | Admitting: Emergency Medicine

## 2013-02-18 ENCOUNTER — Ambulatory Visit: Payer: 59

## 2013-02-18 VITALS — BP 132/80 | HR 79 | Temp 98.5°F | Resp 16 | Ht 69.0 in | Wt 219.4 lb

## 2013-02-18 DIAGNOSIS — M25511 Pain in right shoulder: Secondary | ICD-10-CM

## 2013-02-18 DIAGNOSIS — M7551 Bursitis of right shoulder: Secondary | ICD-10-CM

## 2013-02-18 DIAGNOSIS — M25519 Pain in unspecified shoulder: Secondary | ICD-10-CM

## 2013-02-18 DIAGNOSIS — M67919 Unspecified disorder of synovium and tendon, unspecified shoulder: Secondary | ICD-10-CM

## 2013-02-18 MED ORDER — TRIAMCINOLONE ACETONIDE 40 MG/ML IJ SUSP
40.0000 mg | Freq: Once | INTRAMUSCULAR | Status: AC
  Administered 2013-02-18: 40 mg via INTRAMUSCULAR

## 2013-02-18 NOTE — Progress Notes (Signed)
  Subjective:    Patient ID: Gabriel Richardson, male    DOB: 07/13/1945, 67 y.o.   MRN: [id number]  HPI patient enters with pain and discomfort in his right shoulder. He has pain with internal and external rotation of the shoulder. He denies radicular symptoms down the right arm. He does have a history of cervical problems but has no pain in his neck and full range of motion in the neck. He has had multiple steroid tapers for his COPD without difficulty.    Review of Systems patient has a history of COPD no recent flareups. He has an albuterol rescue inhaler he has not had to use. He has a history of bipolar disease which has been stable and has never had exacerbations following prednisone.     Objective:   Physical Exam patient has pain and limitation with internal and external rotation. Internal and external rotation against resistance reveals no weakness. He has diminished brachial radialis and biceps there is no weakness to grip strength biceps or brachioradialis motor strength. He does have pain with abduction of the right shoulder.  [facility] reading (PRIMARY) by  Dr Cleta Alberts is minimal degenerative changes over the greater tuberosity of the humeral head .  The deltoid area was prepped with Betadine, cleaned with alcohol, and injected  with 1 cc of 2% plain. He was then  injected with 40 of Kenalog along with 2 cc of 2% plain lidocaine. He did have some areas of tenia versicolor over his anterior chest and anterior shoulder. The area prepped did not show any signs of topical fungal infection.       Assessment & Plan:   Right shoulder to be injected with Kenalog 40 mg along with 2 cc of 2% plain after numbing. Patient to apply ice. He is to return in one week for his routine flu shot.

## 2013-02-25 ENCOUNTER — Ambulatory Visit (INDEPENDENT_AMBULATORY_CARE_PROVIDER_SITE_OTHER): Payer: 59

## 2013-02-25 DIAGNOSIS — Z23 Encounter for immunization: Secondary | ICD-10-CM

## 2013-03-10 ENCOUNTER — Other Ambulatory Visit: Payer: Self-pay | Admitting: Emergency Medicine

## 2013-03-11 NOTE — Telephone Encounter (Signed)
Dr Cleta Alberts, I don't see where you had Rxd this for pt? Please advise if you want to RF.

## 2013-03-15 ENCOUNTER — Telehealth: Payer: Self-pay | Admitting: Radiology

## 2013-03-15 DIAGNOSIS — H02402 Unspecified ptosis of left eyelid: Secondary | ICD-10-CM

## 2013-03-15 NOTE — Telephone Encounter (Signed)
Patient wants to know if he can be referred to Neurology at [facility] . He was pt of Dr Sandria Manly he retired and he does not like Dr Hosie Poisson. Please advise.

## 2013-03-16 NOTE — Telephone Encounter (Signed)
It is time to make the referral for Gabriel Richardson to see the neurologist that works at [employer]

## 2013-03-16 NOTE — Telephone Encounter (Signed)
Referral made, called patient to advise.

## 2013-03-17 ENCOUNTER — Ambulatory Visit (INDEPENDENT_AMBULATORY_CARE_PROVIDER_SITE_OTHER): Payer: 59 | Admitting: Emergency Medicine

## 2013-03-17 VITALS — BP 130/84 | HR 86 | Temp 99.0°F | Resp 16 | Ht 69.0 in | Wt 215.6 lb

## 2013-03-17 DIAGNOSIS — H109 Unspecified conjunctivitis: Secondary | ICD-10-CM

## 2013-03-17 DIAGNOSIS — M25511 Pain in right shoulder: Secondary | ICD-10-CM

## 2013-03-17 DIAGNOSIS — M25519 Pain in unspecified shoulder: Secondary | ICD-10-CM

## 2013-03-17 MED ORDER — ZOSTER VACCINE LIVE 19400 UNT/0.65ML ~~LOC~~ SOLR: 0.6500 mL | Freq: Once | SUBCUTANEOUS | Status: DC

## 2013-03-17 MED ORDER — OFLOXACIN 0.3 % OP SOLN: 1.0000 [drp] | Freq: Four times a day (QID) | OPHTHALMIC | Status: DC

## 2013-03-17 NOTE — Progress Notes (Addendum)
Subjective:    Patient ID: Gabriel Richardson, male    DOB: [birth year], 67 y.o.   MRN: [id number]  HPI This chart was scribed for Viviann Spare Kriya Westra-MD, by Ladona Ridgel Day, Scribe. This patient was seen in room 4 and the patient's care was started at 8:17 AM.  HPI Comments: Gabriel Richardson is a 67 y.o. male who presents to the [facility] complaining for follow up of his right shoulder pain which he states has not resolved. He was here 3 Pha ago for this problem and had a cortisone shot to his right shoulder. He states today is still having pain w/ROM of his shoulder, especially flexing his shoulder or movements driving his car.    He recently got a referral to Dr. Allena Katz for cramping symptoms that he has been having related to a neuromuscular disease. He states was having hand tremors and difficulty w/ADLs. He had surgery by Dr. Channing Mutters 3 years ago, and w/out problems since. Cervical fusion. He states these symptoms feel similar to previous symptoms coming from his neck. He recently saw a neurologist at Dr. Imagene Gurney office and saw Dr. Hosie Poisson for this problem.   He also reports left eye problem, onset last PM in which he had drainage and conjunctival redness. He states eye d/c was sticky. He denies any sick contacts.  He states using his albuterol inhaler during season changes and uses it as needed if he is around any dust or pollutants in the air (Hx of COPD). He states his breathing has been well lately. He denies cough, SOB or recent illnesses.  Past Medical History  Diagnosis Date  . Depression   . Anxiety   . COPD (chronic obstructive pulmonary disease)     Past Surgical History  Procedure Laterality Date  . Spine surgery      No family history on file.  History   Social History  . Marital Status: Married    Spouse Name: [name]    Number of Children: 2  . Years of Education: BS   Occupational History  .      Works for himself   Social History Main Topics  . Smoking status:  Former Smoker -- 1.50 packs/day for 25 years    Types: Cigarettes    Quit date: 05/20/1997  . Smokeless tobacco: Never Used  . Alcohol Use: 1.2 oz/week    2 Shots of liquor per week     Comment: Two shots daily  . Drug Use: No  . Sexual Activity: Yes    Birth Control/ Protection: None   Other Topics Concern  . Not on file   Social History Narrative   Patient lives at home with his wife [name]. Patient has a college education. B.S. Patient is self employed.   Right handed.   Caffeine- one cup    No Known Allergies  Patient Active Problem List   Diagnosis Date Noted  . Ptosis 12/14/2012  . Tremor 12/14/2012  . Depression 11/02/2012  . Asthma, chronic 11/02/2012  . HYPERTENSION, BENIGN 01/15/2009  . ABNORMAL ELECTROCARDIOGRAM 01/12/2009    Results for orders placed in visit on 11/02/12  ACETYLCHOLINE RECEPTOR, BINDING      Result Value Range   A CHR BINDING ABS <0.30  <=0.30 nmol/L  ACETYLCHOLINE RECEPTOR, BLOCKING      Result Value Range   ACHR Blocking Abs <15  <15 % inhibit    No diagnosis found.  No orders of the defined types were placed in this  encounter.    Review of Systems  Constitutional: Negative for chills.  Eyes: Positive for discharge and redness.  Respiratory: Negative for cough and shortness of breath.   Cardiovascular: Negative for chest pain.  Gastrointestinal: Negative for abdominal pain.  Musculoskeletal:       Right shoulder pain       Objective:   Physical Exam  Nursing note and vitals reviewed. Constitutional: He is oriented to person, place, and time. He appears well-developed and well-nourished. No distress.  HENT:  Head: Normocephalic and atraumatic.  Mouth/Throat: Oropharynx is clear and moist.  Eyes: Pupils are equal, round, and reactive to light.  Mild swelling left eyelid  Neck: Neck supple. No tracheal deviation present.  Cardiovascular: Normal rate.   Pulmonary/Chest: Effort normal. No respiratory distress.   Musculoskeletal: Normal range of motion.  Neurological: He is alert and oriented to person, place, and time.  Skin: Skin is warm and dry.  Psychiatric: He has a normal mood and affect. His behavior is normal.     further exam of the left eye reveals mild injection of the conjunctiva. The lid is puffy. Funduscopic exam is unremarkable. Assessment & Plan:  Patient has significant trouble with his right arm. He has pain with internal/external rotation and elevation of the right shoulder. Will refer to Dr. Janeece Fitting office for evaluation. He also has an inflammation of the left upper lid  along with some injection of the left conjunctiva. Will treat with Ocuflox for this. He was also given a prescription for Zostavax to have administered once he is over this conjunctivitis.

## 2013-03-18 ENCOUNTER — Other Ambulatory Visit: Payer: Self-pay | Admitting: Orthopedic Surgery

## 2013-03-18 ENCOUNTER — Telehealth: Payer: Self-pay

## 2013-03-18 DIAGNOSIS — M25511 Pain in right shoulder: Secondary | ICD-10-CM

## 2013-03-18 NOTE — Telephone Encounter (Signed)
I talked to patient and he saw Dr. Madelon Lips today and is going to have an MRI

## 2013-03-18 NOTE — Telephone Encounter (Signed)
PT WOULD LIKE A CALL BACK FROM [facility]. PLEASE CALL [phone]

## 2013-03-18 NOTE — Telephone Encounter (Signed)
He is all set with the Neurologist. He wants to see Ortho, today if possible. Dr Madelon Lips can see patient at 2pm today. He should arrive at 1:45, called patient but he does not want to go to see Dr Madelon Lips because you said Dr Dion Saucier, but Dr Dion Saucier out of office today, and patient wants to go today, do you want him to reschedule to another day to see Dr Dion Saucier or is Dr Madelon Lips okay?

## 2013-03-19 ENCOUNTER — Other Ambulatory Visit: Payer: 59

## 2013-03-19 NOTE — Telephone Encounter (Signed)
Thank you :)

## 2013-03-22 ENCOUNTER — Other Ambulatory Visit: Payer: 59

## 2013-03-23 ENCOUNTER — Other Ambulatory Visit: Payer: 59

## 2013-03-26 ENCOUNTER — Telehealth: Payer: Self-pay

## 2013-03-26 ENCOUNTER — Other Ambulatory Visit: Payer: Self-pay | Admitting: Emergency Medicine

## 2013-03-26 NOTE — Telephone Encounter (Signed)
Pt called and asked operator to have [place] expedite this RF if possible as he is going out of town this weekend. Dr Cleta Alberts, do you want to RF this or does it need to go to the ortho pt was referred to?

## 2013-03-29 ENCOUNTER — Ambulatory Visit: Payer: 59 | Admitting: Neurology

## 2013-04-08 ENCOUNTER — Ambulatory Visit
Admission: RE | Admit: 2013-04-08 | Discharge: 2013-04-08 | Disposition: A | Discharge status: Home / Self Care | Payer: 59 | Source: Ambulatory Visit | Attending: Orthopedic Surgery | Admitting: Orthopedic Surgery

## 2013-04-08 DIAGNOSIS — M25511 Pain in right shoulder: Secondary | ICD-10-CM

## 2013-04-20 ENCOUNTER — Other Ambulatory Visit: Payer: Self-pay | Admitting: Emergency Medicine

## 2013-04-22 ENCOUNTER — Ambulatory Visit: Payer: 59 | Admitting: Neurology

## 2013-04-23 NOTE — Telephone Encounter (Signed)
No message

## 2013-04-30 ENCOUNTER — Encounter: Payer: Self-pay | Admitting: Neurology

## 2013-04-30 ENCOUNTER — Ambulatory Visit (INDEPENDENT_AMBULATORY_CARE_PROVIDER_SITE_OTHER): Payer: 59 | Admitting: Neurology

## 2013-04-30 VITALS — BP 138/80 | HR 76 | Temp 98.0°F | Ht 71.0 in | Wt 221.6 lb

## 2013-04-30 DIAGNOSIS — R259 Unspecified abnormal involuntary movements: Secondary | ICD-10-CM

## 2013-04-30 DIAGNOSIS — R251 Tremor, unspecified: Secondary | ICD-10-CM

## 2013-04-30 DIAGNOSIS — H02409 Unspecified ptosis of unspecified eyelid: Secondary | ICD-10-CM

## 2013-04-30 DIAGNOSIS — R252 Cramp and spasm: Secondary | ICD-10-CM

## 2013-04-30 DIAGNOSIS — H02402 Unspecified ptosis of left eyelid: Secondary | ICD-10-CM

## 2013-04-30 NOTE — Progress Notes (Signed)
[facility] Neurology Division Clinic Note - Initial Visit   Date: 04/30/2013    Gabriel Richardson MRN: [id number] DOB: 1946-01-15   Dear Dr Gabriel Richardson:  Thank you for your kind referral of Gabriel Richardson for consultation of ptosis. Although his history is well known to you, please allow [place] to reiterate it for the purpose of our medical record. The patient was accompanied to the clinic by self.   History of Present Illness: Gabriel Richardson is a 67 y.o. right-handed Caucasian male with history of depression, asthma, hypertension, and intention tremor presenting to establish care for benign essential tremor, muscle cramps, and ptosis.  He had previously been a patient of Dr. Alena Richardson who he had been seeing since 1995.  He initially saw Dr. Sandria Richardson for right trigeminal neuralgia which he had frequent flares of since 1997 - early 2000. He had previously tried trileptal, neurontin, and lyrica and had the most relief with Lyrica.  He has not had a flare in the past 12 years.  Around 2012, he started developing muscle cramps and left hand tremors and was concerned about multiple sclerosis. He had an evaluation including imaging. Although there was white matter changes on his MRI, Dr. Sandria Richardson did not find that his exam and imaging was consistent with demyelinating disease. He also had CSF testing in January 2014 which did not show any inflammatory changes. He treated him symptomatically and was started on propranolol 60mg  which has signifcantly helped with his tremors.  He takes robaxin 500mg  twice daily as needed which also resolves his muscle cramps.  He used to have occasionally has bilateral painful hand cramps and severe neck pain.  He underwent anterior cervical fusion by Gabriel Richardson in 2010 which totally resolved his symptoms.  He has also has history of left L5 radiculopathy status post L5-S1 laminectomy.  More recently, he saw Dr. Hosie Richardson in July for new left ptosis.  Myasthenia antibodies were checked  and was normal.  He declined to have additional testing done to evaluate this.  He is here today to transfer care for his ongoing neurological problems. He denies any new or worsening symptoms.  Past Medical History  Diagnosis Date  . Depression   . Anxiety   . COPD (chronic obstructive pulmonary disease)   . Cervical stenosis of spinal canal     Past Surgical History  Procedure Laterality Date  . Spine surgery      Cervical C3-C5 anterior fusion     Medications:  Current Outpatient Prescriptions on File Prior to Visit  Medication Sig Dispense Refill  . albuterol (PROVENTIL HFA;VENTOLIN HFA) 108 (90 BASE) MCG/ACT inhaler Inhale 2 puffs into the lungs every 4 (four) hours as needed for wheezing (cough, shortness of breath or wheezing.).  3 Inhaler  1  . ALPRAZolam (XANAX) 0.5 MG tablet Take 0.5 mg by mouth at bedtime as needed for sleep.      . budesonide (PULMICORT) 180 MCG/ACT inhaler Inhale 1-2 puffs into the lungs 2 (two) times daily.  3 Inhaler  3  . lamoTRIgine (LAMICTAL) 200 MG tablet Take 200 mg by mouth daily.      . methocarbamol (ROBAXIN) 500 MG tablet TAKE 1 TABLET BY MOUTH EVERY 6 HOURS  40 tablet  0  . ofloxacin (OCUFLOX) 0.3 % ophthalmic solution Place 1 drop into the left eye 4 (four) times daily.  5 mL  0  . omeprazole (PRILOSEC) 20 MG capsule Take 20 mg by mouth daily.      Marland Kitchen  propranolol ER (INDERAL LA) 60 MG 24 hr capsule Take 60 mg by mouth daily.      . sildenafil (VIAGRA) 50 MG tablet Take 1 tablet (50 mg total) by mouth as needed for erectile dysfunction. NEED CPE!  10 tablet  0  . zolpidem (AMBIEN) 5 MG tablet Take 10 mg by mouth at bedtime as needed for sleep.      Marland Kitchen zoster vaccine live, PF, (ZOSTAVAX) 95284 UNT/0.65ML injection Inject 19,400 Units into the skin once.  1 each  0   No current facility-administered medications on file prior to visit.    Allergies: No Known Allergies  Family History: Family History  Problem Relation Age of Onset  .  Kidney disease Mother   . Breast cancer Mother     Living, 11  . Diabetes Mellitus II Father     Deceased, 52  . Tremor Father   . Depression Daughter   . Healthy Brother     Social History: History   Social History  . Marital Status: Married    Spouse Name: [name]    Number of Children: 2  . Years of Education: BS   Occupational History  .      Works for himself   Social History Main Topics  . Smoking status: Former Smoker -- 1.50 packs/day for 25 years    Types: Cigarettes    Quit date: 05/20/1997  . Smokeless tobacco: Never Used  . Alcohol Use: 1.2 oz/week    2 Shots of liquor per week     Comment: Two Manhattan drink daily  . Drug Use: No  . Sexual Activity: Yes    Birth Control/ Protection: None   Other Topics Concern  . Not on file   Social History Narrative   He currently works and Psychologist, forensic.       Patient lives at home with his wife Gabriel Richardson in a one-story home.  Patient has a college education. B.S. Patient is self employed.  They have two grown daughters.   Right handed.   Caffeine- one cup    Review of Systems:  CONSTITUTIONAL: No fevers, chills, night sweats, or weight loss.   EYES: No visual changes or eye pain ENT: No hearing changes.  No history of nose bleeds.   RESPIRATORY: No cough, wheezing and shortness of breath.   CARDIOVASCULAR: Negative for chest pain, and palpitations.   GI: Negative for abdominal discomfort, blood in stools or black stools.  No recent change in bowel habits.   GU:  No history of incontinence.   MUSCLOSKELETAL: No history of joint pain or swelling.  No myalgias.   SKIN: Negative for lesions, rash, and itching.   HEMATOLOGY/ONCOLOGY: Negative for prolonged bleeding, bruising easily, and swollen nodes.   ENDOCRINE: Negative for cold or heat intolerance, polydipsia or goiter.   PSYCH:  +depression or anxiety symptoms.   NEURO: As Above.   Vital Signs:  BP 138/80  Pulse 76  Temp(Src) 98 F (36.7 C)  (Oral)  Ht 5\' 11"  (1.803 m)  Wt 221 lb 9.6 oz (100.517 kg)  BMI 30.92 kg/m2 Pain Scale: 0 on a scale of 0-10   General Medical Exam:   General:  Well appearing, comfortable.   Eyes/ENT: see cranial nerve examination.   Neck: No masses appreciated.  Full range of motion without tenderness.   Respiratory:  Clear to auscultation, good air entry bilaterally.   Cardiac:  Regular rate and rhythm, no murmur.   Extremities:  No deformities, edema,  or skin discoloration. Skin:  Skin color, texture, turgor normal. No rashes or lesions.  Neurological Exam: MENTAL STATUS including orientation to time, place, person, recent and remote memory, attention span and concentration, language, and fund of knowledge is normal.  Speech is not dysarthric.  CRANIAL NERVES: II:  No visual field defects.  Unremarkable fundi.   III-IV-VI: Pupils equal round and reactive to light.  Normal conjugate, extra-ocular eye movements in all directions of gaze.  No nystagmus. Subtle left ptosis at baseline with no worsening with sustained upgaze.   V:  Normal facial sensation.  Jaw jerk is absent.   VII:  Normal facial symmetry and movements.  No pathologic facial reflexes.  VIII:  Normal hearing and vestibular function.   IX-X:  Normal palatal movement.   XI:  Normal shoulder shrug and head rotation.   XII:  Normal tongue strength and range of motion, no deviation or fasciculation.  MOTOR:  Mild left > right  hand intention tremor is seen and hands out stretch and is more prominent finger to nose testing. No atrophy or fasciculations.  No pronator drift.  Tone is normal.    Right Upper Extremity:    Left Upper Extremity:    Deltoid  5/5   Deltoid  5/5   Biceps  5/5   Biceps  5/5   Triceps  5/5   Triceps  5/5   Wrist extensors  5/5   Wrist extensors  5/5   Wrist flexors  5/5   Wrist flexors  5/5   Finger extensors  5/5   Finger extensors  5/5   Finger flexors  5/5   Finger flexors  5/5   Dorsal interossei  5/5    Dorsal interossei  5/5   Abductor pollicis  5/5   Abductor pollicis  5/5   Tone (Ashworth scale)  0  Tone (Ashworth scale)  0   Right Lower Extremity:    Left Lower Extremity:    Hip flexors  5/5   Hip flexors  5/5   Hip extensors  5/5   Hip extensors  5/5   Knee flexors  5/5   Knee flexors  5/5   Knee extensors  5/5   Knee extensors  5/5   Dorsiflexors  5/5   Dorsiflexors  5/5   Plantarflexors  5/5   Plantarflexors  5/5   Toe extensors  5/5   Toe extensors  5/5   Toe flexors  5/5   Toe flexors  5/5   Tone (Ashworth scale)  0  Tone (Ashworth scale)  0   MSRs:  Right                                                                 Left brachioradialis 2+  brachioradialis 2+  biceps 2+  biceps 2+  triceps 2+  triceps 2+  patellar 2+  patellar 2+  ankle jerk 2+  ankle jerk 2+  Hoffman no  Hoffman no  plantar response down  plantar response down   SENSORY:  Diminished vibration at great toe bilaterally (likely age-related); otherwise normal and symmetric perception of light touch, pinprick, and proprioception.  Romberg's sign absent.   COORDINATION/GAIT: Normal finger-to- nose-finger and heel-to-shin.  Intact rapid alternating movements bilaterally.  Able to  rise from a chair without using arms.  Gait narrow based and stable. Poor arm swing bilaterally. Tandem and stressed gait intact.   Data: AChR blocking and binding 11/02/2012 - neg  CSF 06/02/2012:  R0  W1  G63  P43  IgG index 0.43   OCB -neg  MRI brain wwo contrast 07/08/2009: 1. Advanced white matter disease in a pattern compatible with multiple sclerosis. Mild progression since 2004. No areas of  enhancement.  2. No acute intracranial abnormality.  MRI cervical spine 09/02/2008: 1. Moderate central and bilateral foraminal stenosis at C4-5. This is the worst level.  2. Moderate left and mild right foraminal narrowing at C5-6.  3. Asymmetric right disc osteophyte complex at C6-7 without significant stenosis.  4. Mild to  moderate right foraminal stenosis at C3-4.  5. Mild central canal stenosis at C3-4.    IMPRESSION/PLAN: 1.  Benign essential tremor of the hands worse on the left side  - Well controlled on propranolol 60 mg daily which patient will continue 2.  Muscle cramps  - Well controlled with Robaxin 500 mg twice daily as needed 3.  Left ptosis  - Likely an incidental finding and old  - No features on examination to suggest myasthenia (AChR binding and blocking antibodies are negative), Horner's syndrome, or CN III palsy  - Continue to follow closely, can check TSH at next visit 4.  White matter changes on MRI  - CSF testing in January 2014 does not show any inflammatory changes  - No compelling evidence to suggest multiple sclerosis 5.  Depression  - Being followed by Dr. Donell Beers  - Currently on Lamictal 200 mg daily and Xanax 0.5 mg once daily as needed 5. Return to clinic in 6 months or sooner as needed   The duration of this appointment visit was 45 minutes of face-to-face time with the patient.  Greater than 50% of this time was spent in counseling, explanation of diagnosis, planning of further management, and coordination of care.   Thank you for allowing me to participate in patient's care.  If I can answer any additional questions, I would be pleased to do so.    Sincerely,    Laryn Venning K. Allena Katz, DO

## 2013-04-30 NOTE — Patient Instructions (Signed)
1.  Continue taking propranolol 60mg  daily and robaxin 500mg  twice daily as needed 2.  Return to clinic in 68-months, or sooner as needed

## 2013-05-03 NOTE — Progress Notes (Signed)
faxed

## 2013-05-10 ENCOUNTER — Other Ambulatory Visit: Payer: Self-pay | Admitting: Physician Assistant

## 2013-05-12 ENCOUNTER — Other Ambulatory Visit: Payer: Self-pay | Admitting: Emergency Medicine

## 2013-05-14 ENCOUNTER — Other Ambulatory Visit: Payer: Self-pay | Admitting: Neurology

## 2013-05-17 ENCOUNTER — Other Ambulatory Visit: Payer: Self-pay | Admitting: Neurology

## 2013-05-17 MED ORDER — METHOCARBAMOL 500 MG PO TABS: ORAL_TABLET | ORAL | Status: DC

## 2013-05-17 NOTE — Telephone Encounter (Signed)
Called and spoke with the patient. He is requesting a med refill for his Robaxin 500 mg q 6 hours prn. Ordering prescriber is Dr. Cleta Alberts but the patient is asking for Dr. Allena Katz to take over prescribing it. Patient is aware that Dr. Allena Katz is out of the office until 05/21/13 so refill given for #40 with zero refills. Will check with Dr. Allena Katz upon her return to be sure she is ok to prescribe. She did state in the office note to continue the Robaxin 500 mg twice a day. The patient is seeing Dr. Cleta Alberts next Wednesday as well. **Dr. Allena Katz please advise any further refill requests. Thanks in advance.

## 2013-05-19 ENCOUNTER — Telehealth: Payer: Self-pay

## 2013-05-19 NOTE — Telephone Encounter (Signed)
PT WOULD LIKE TO HAVE A SHINGLE SHOT PRESCRIPTION CALLED IN FOR HIM, ALSO WOULD LIKE TO SPEAK WITH SOMEONE ABOUT HIS PNEUMONIA SHOT PLEASE CALL [phone]   [facility] [address]

## 2013-05-19 NOTE — Telephone Encounter (Signed)
He wants to know if he can get shingles vaccine, please advise. Pended.  He was asking also about a pneumonia vaccine, answered his question regarding this.

## 2013-05-20 NOTE — Telephone Encounter (Signed)
Please call patient had been make an appointment for physical exam. At that time we can update all of his immunizations that are recommended at age 68. If he wants [place] to go ahead and send in a prescription for Zostavax that  will be okay. We can administer the pneumonia vaccine at the time of his physical exam

## 2013-05-20 NOTE — Telephone Encounter (Signed)
Thanks, I have called him to advise. He will call and schedule his physical

## 2013-05-26 ENCOUNTER — Other Ambulatory Visit: Payer: Self-pay

## 2013-05-26 NOTE — Telephone Encounter (Signed)
Dr. Everlene Farrier - Patient called to schedule a CPE for March 24 but will need refills of Methocarbamol 500 MG before that time. He wants someone to advise if you can continue to refill that without interruption due to his March appt. Date. Please call him at [phone] to discuss.  [facility] [address]

## 2013-05-27 MED ORDER — METHOCARBAMOL 500 MG PO TABS: ORAL_TABLET | ORAL | Status: DC

## 2013-05-27 NOTE — Telephone Encounter (Signed)
He has recently gotten this from the Neurologist, pended for you. He is asking for refills.

## 2013-07-14 ENCOUNTER — Other Ambulatory Visit: Payer: Self-pay | Admitting: Neurology

## 2013-07-14 NOTE — Telephone Encounter (Signed)
Pt is requesting #90 with 1 refill please.

## 2013-07-14 NOTE — Telephone Encounter (Signed)
Propranolol refill requested. Per last office note- patient to remain on medication. Refill approved and sent to patient's pharmacy.   

## 2013-08-10 ENCOUNTER — Encounter: Payer: 59 | Admitting: Emergency Medicine

## 2013-08-13 ENCOUNTER — Ambulatory Visit: Payer: 59

## 2013-08-13 ENCOUNTER — Encounter: Payer: Self-pay | Admitting: Emergency Medicine

## 2013-08-13 ENCOUNTER — Ambulatory Visit (INDEPENDENT_AMBULATORY_CARE_PROVIDER_SITE_OTHER): Payer: 59 | Admitting: Emergency Medicine

## 2013-08-13 ENCOUNTER — Other Ambulatory Visit: Payer: Self-pay | Admitting: Emergency Medicine

## 2013-08-13 VITALS — BP 130/82 | HR 75 | Temp 98.8°F | Resp 16 | Ht 68.0 in | Wt 211.0 lb

## 2013-08-13 DIAGNOSIS — Z136 Encounter for screening for cardiovascular disorders: Secondary | ICD-10-CM

## 2013-08-13 DIAGNOSIS — R945 Abnormal results of liver function studies: Secondary | ICD-10-CM

## 2013-08-13 DIAGNOSIS — J449 Chronic obstructive pulmonary disease, unspecified: Secondary | ICD-10-CM

## 2013-08-13 DIAGNOSIS — Z0000 Encounter for general adult medical examination without abnormal findings: Secondary | ICD-10-CM

## 2013-08-13 DIAGNOSIS — R7989 Other specified abnormal findings of blood chemistry: Secondary | ICD-10-CM

## 2013-08-13 DIAGNOSIS — I10 Essential (primary) hypertension: Secondary | ICD-10-CM

## 2013-08-13 DIAGNOSIS — J4489 Other specified chronic obstructive pulmonary disease: Secondary | ICD-10-CM

## 2013-08-13 LAB — COMPLETE METABOLIC PANEL WITH GFR
ALBUMIN: 4 g/dL (ref 3.5–5.2)
ALT: 61 U/L — ABNORMAL HIGH (ref 0–53)
AST: 77 U/L — AB (ref 0–37)
Alkaline Phosphatase: 57 U/L (ref 39–117)
BILIRUBIN TOTAL: 0.9 mg/dL (ref 0.2–1.2)
BUN: 8 mg/dL (ref 6–23)
CO2: 27 mEq/L (ref 19–32)
Calcium: 8.8 mg/dL (ref 8.4–10.5)
Chloride: 105 mEq/L (ref 96–112)
Creat: 0.66 mg/dL (ref 0.50–1.35)
GFR, Est African American: >89 mL/min
GFR, Est Non African American: >89 mL/min
Glucose, Bld: 104 mg/dL — ABNORMAL HIGH (ref 70–99)
POTASSIUM: 4.1 meq/L (ref 3.5–5.3)
SODIUM: 141 meq/L (ref 135–145)
Total Protein: 7.4 g/dL (ref 6.0–8.3)

## 2013-08-13 LAB — POCT URINALYSIS DIPSTICK
BILIRUBIN UA: NEGATIVE
Glucose, UA: NEGATIVE
KETONES UA: NEGATIVE
Leukocytes, UA: NEGATIVE
NITRITE UA: NEGATIVE
Protein, UA: NEGATIVE
RBC UA: NEGATIVE
SPEC GRAV UA: 1.025
Urobilinogen, UA: 1
pH, UA: 6

## 2013-08-13 LAB — LIPID PANEL
CHOL/HDL RATIO: 2.9 ratio
Cholesterol: 146 mg/dL (ref 0–200)
HDL: 50 mg/dL (ref >39)
LDL Cholesterol: 75 mg/dL (ref 0–99)
TRIGLYCERIDES: 103 mg/dL (ref <150)
VLDL: 21 mg/dL (ref 0–40)

## 2013-08-13 LAB — CBC WITH DIFFERENTIAL/PLATELET
BASOS PCT: 1 % (ref 0–1)
Basophils Absolute: 0.1 10*3/uL (ref 0.0–0.1)
Eosinophils Absolute: 0.4 10*3/uL (ref 0.0–0.7)
Eosinophils Relative: 6 % — ABNORMAL HIGH (ref 0–5)
HCT: 41.2 % (ref 39.0–52.0)
Hemoglobin: 14.9 g/dL (ref 13.0–17.0)
Lymphocytes Relative: 43 % (ref 12–46)
Lymphs Abs: 2.6 10*3/uL (ref 0.7–4.0)
MCH: 33.8 pg (ref 26.0–34.0)
MCHC: 36.2 g/dL — AB (ref 30.0–36.0)
MCV: 93.4 fL (ref 78.0–100.0)
MONO ABS: 0.6 10*3/uL (ref 0.1–1.0)
Monocytes Relative: 10 % (ref 3–12)
NEUTROS ABS: 2.4 10*3/uL (ref 1.7–7.7)
NEUTROS PCT: 40 % — AB (ref 43–77)
Platelets: 162 10*3/uL (ref 150–400)
RBC: 4.41 MIL/uL (ref 4.22–5.81)
RDW: 14.2 % (ref 11.5–15.5)
WBC: 6 10*3/uL (ref 4.0–10.5)

## 2013-08-13 LAB — IFOBT (OCCULT BLOOD): IFOBT: NEGATIVE

## 2013-08-13 NOTE — Progress Notes (Signed)
   Subjective:    Patient ID: Gabriel Richardson, male    DOB: 10/26/1945, 68 y.o.   MRN: [id number]  HPI    Review of Systems  Constitutional: Negative.   HENT: Negative.   Eyes: Negative.   Respiratory: Negative.   Cardiovascular: Negative.   Gastrointestinal: Negative.   Endocrine: Negative.   Genitourinary: Negative.   Musculoskeletal: Negative.   Allergic/Immunologic: Negative.   Neurological: Positive for tremors.  Hematological: Negative.   Psychiatric/Behavioral: Negative.        Objective:   Physical Exam HEENT exam is unremarkable. Neck is supple without carotid bruits. Chest exam reveals increased AP diameter. Cardiac reveals distant heart sounds without murmurs or gallops. The abdomen is soft nontender. Rectal exam reveals a normal-sized prostate without nodularity. Pulses in lower extremities are 2+ and symmetrical there is no edema noted EKG normal sinus rhythm no acute change  [facility] reading (PRIMARY) by  Dr.Gina Leblond changes consistent with COPD no definite nodule seen Results for orders placed in visit on 08/13/13  POCT URINALYSIS DIPSTICK      Result Value Ref Range   Color, UA yellow     Clarity, UA clear     Glucose, UA neg     Bilirubin, UA neg     Ketones, UA neg     Spec Grav, UA 1.025     Blood, UA neg     pH, UA 6.0     Protein, UA neg     Urobilinogen, UA 1.0     Nitrite, UA neg     Leukocytes, UA Negative    IFOBT (OCCULT BLOOD)      Result Value Ref Range   IFOBT Negative          Assessment & Plan:  Referral made to Dr. Burt Knack for cardiac screening. He wants to hold off on referral to Dr. Benson Norway for evaluation for GI screening. He did have a virtual colonoscopy performed 7 years ago. He has no abdominal complaints and has a negative hemosure. Routine labs were done. He'll continue followup with Dr. Melvyn Novas  and with Dr. Casimiro Needle

## 2013-08-14 LAB — PSA: PSA: 0.54 ng/mL (ref <=4.00)

## 2013-08-14 LAB — HEPATITIS PANEL, ACUTE
HCV Ab: NEGATIVE
HEP A IGM: NONREACTIVE
Hep B C IgM: NONREACTIVE
Hepatitis B Surface Ag: NEGATIVE

## 2013-08-14 LAB — TSH: TSH: 1.632 u[IU]/mL (ref 0.350–4.500)

## 2013-08-14 NOTE — Addendum Note (Signed)
Addended by: Kyra Manges on: 08/14/2013 10:29 AM   Modules accepted: Orders

## 2013-08-18 ENCOUNTER — Telehealth: Payer: Self-pay

## 2013-08-18 NOTE — Telephone Encounter (Signed)
Patient upset that Dr. Everlene Farrier has referred him "everywhere" he doesn't know why he has an appt. to [facility] for an abdomen complete ultrasound. Please advise he is upset and needs to know why he is being referred to a cardiologist (cardic consult to a Dr. He does not see regularly) and for an ultrasound that he does not understand whu. He says he is "shocked" he has never received lab results.   Best: [phone]

## 2013-08-19 ENCOUNTER — Other Ambulatory Visit: Payer: 59

## 2013-08-19 NOTE — Telephone Encounter (Signed)
Lab result notes:  Notes Recorded by Kyra Manges, CMA on 08/14/2013 at 10:29 AM Patient notified and agreed to the Ultrasound. ------  Notes Recorded by Darlyne Russian, MD on 08/14/2013 at 9:53 AM An acute hepatitis panel to blood at the lab. Advise we do an ultrasound of the liver to rule out structural abnormality. If he is willing go ahead and order this. Advise him if he is drinking to decrease his alcohol intake.

## 2013-08-19 NOTE — Telephone Encounter (Signed)
Went over results with pt and reasons for the referrals. Pt understands and will call to reschedule the [place] and cardiac consult. He had cancelled because he was unclear as to the reason for these consults. Pt has cut back on his alcohol intake.

## 2013-08-26 ENCOUNTER — Ambulatory Visit
Admission: RE | Admit: 2013-08-26 | Discharge: 2013-08-26 | Disposition: A | Discharge status: Home / Self Care | Payer: 59 | Source: Ambulatory Visit | Attending: Emergency Medicine | Admitting: Emergency Medicine

## 2013-08-26 DIAGNOSIS — R945 Abnormal results of liver function studies: Secondary | ICD-10-CM

## 2013-08-26 DIAGNOSIS — R7989 Other specified abnormal findings of blood chemistry: Secondary | ICD-10-CM

## 2013-09-07 ENCOUNTER — Other Ambulatory Visit: Payer: Self-pay | Admitting: Neurology

## 2013-09-07 ENCOUNTER — Institutional Professional Consult (permissible substitution): Payer: 59 | Admitting: Cardiology

## 2013-09-07 NOTE — Telephone Encounter (Signed)
Rx filled, please notify pt.  Donika K. Posey Pronto, DO

## 2013-09-07 NOTE — Telephone Encounter (Signed)
Filled in March with only 1 refill.  How many refills can I give?

## 2013-09-09 ENCOUNTER — Other Ambulatory Visit: Payer: Self-pay | Admitting: *Deleted

## 2013-09-09 ENCOUNTER — Telehealth: Payer: Self-pay | Admitting: Neurology

## 2013-09-09 MED ORDER — PROPRANOLOL HCL ER 60 MG PO CP24: 60.0000 mg | ORAL_CAPSULE | Freq: Every day | ORAL | Status: DC

## 2013-09-09 NOTE — Telephone Encounter (Signed)
Order sent in for #90.

## 2013-09-09 NOTE — Telephone Encounter (Signed)
Pt's assistance called requesting a refill for PROPRANOLOLER 60mg  one tablet a day. She stated that it has to be 90 days in order for his insurance to pay for it.  [facility] [address] [facility] [place], [place]

## 2013-11-04 ENCOUNTER — Encounter: Payer: Self-pay | Admitting: Family Medicine

## 2013-11-10 ENCOUNTER — Emergency Department (HOSPITAL_COMMUNITY)
Admission: EM | Admit: 2013-11-10 | Discharge: 2013-11-11 | Disposition: A | Discharge status: Home / Self Care | Payer: 59 | Attending: Emergency Medicine | Admitting: Emergency Medicine

## 2013-11-10 DIAGNOSIS — R51 Headache: Secondary | ICD-10-CM | POA: Insufficient documentation

## 2013-11-10 DIAGNOSIS — J4489 Other specified chronic obstructive pulmonary disease: Secondary | ICD-10-CM | POA: Insufficient documentation

## 2013-11-10 DIAGNOSIS — Z87891 Personal history of nicotine dependence: Secondary | ICD-10-CM | POA: Insufficient documentation

## 2013-11-10 DIAGNOSIS — J449 Chronic obstructive pulmonary disease, unspecified: Secondary | ICD-10-CM | POA: Insufficient documentation

## 2013-11-11 ENCOUNTER — Encounter (HOSPITAL_COMMUNITY): Payer: Self-pay | Admitting: Emergency Medicine

## 2013-11-11 ENCOUNTER — Ambulatory Visit (INDEPENDENT_AMBULATORY_CARE_PROVIDER_SITE_OTHER): Payer: 59 | Admitting: Family Medicine

## 2013-11-11 VITALS — BP 158/94 | HR 67 | Temp 98.5°F | Resp 16 | Ht 69.0 in | Wt 207.0 lb

## 2013-11-11 DIAGNOSIS — R51 Headache: Secondary | ICD-10-CM

## 2013-11-11 DIAGNOSIS — R519 Headache, unspecified: Secondary | ICD-10-CM

## 2013-11-11 DIAGNOSIS — G500 Trigeminal neuralgia: Secondary | ICD-10-CM

## 2013-11-11 MED ORDER — OXYCODONE-ACETAMINOPHEN 5-325 MG PO TABS
1.0000 | ORAL_TABLET | Freq: Once | ORAL | Status: AC
  Administered 2013-11-11: 1 via ORAL
  Filled 2013-11-11: qty 1

## 2013-11-11 MED ORDER — HYDROCODONE-IBUPROFEN 5-200 MG PO TABS: 1.0000 | ORAL_TABLET | Freq: Three times a day (TID) | ORAL | Status: DC | PRN

## 2013-11-11 MED ORDER — ONDANSETRON HCL 4 MG PO TABS: 4.0000 mg | ORAL_TABLET | Freq: Three times a day (TID) | ORAL | Status: DC | PRN

## 2013-11-11 NOTE — Progress Notes (Signed)
Chief Complaint:  Chief Complaint  Patient presents with  . Facial Pain    hx of pain, getting worse over last day    HPI: Gabriel Richardson is a 68 y.o. male who is here for  Right sided Facial pain, he had an episode of neuralgia 21 years ago, hehad sxs similar agoan last night, he was originally onnuerontin and was switched to  Lyrica. He went to [facility] ER and was given percocet, and since he was on empty and got sick. He was given only 1 percocet at 1:30 am. Did not want to stay since he was not going to be seen for another hour. He has 10+/10 pain that will bring him to his knees, his male neurologist told him that it was 10/10 labor pain as was told to the patient. He is havng some blurred visiona nd difficulty keeping his balance. HE is very dehydrated after vomiting, x 2 epsiodes. HE deneis stroke like sxs. He has just pain in the right side of his face. HE denies stroke like sxs , CP , SOB  Past Medical History  Diagnosis Date  . Depression   . Anxiety   . COPD (chronic obstructive pulmonary disease)   . Cervical stenosis of spinal canal   . Asthma   . Emphysema of lung   . Neuromuscular disorder    Past Surgical History  Procedure Laterality Date  . Spine surgery      Cervical C3-C5 anterior fusion  . Lumbar laminectomy    . Cervical fusion     History   Social History  . Marital Status: Married    Spouse Name: [name]    Number of Children: 2  . Years of Education: BS   Occupational History  .      Works for himself   Social History Main Topics  . Smoking status: Former Smoker -- 1.50 packs/day for 25 years    Types: Cigarettes    Quit date: 05/20/1997  . Smokeless tobacco: Never Used  . Alcohol Use: 1.2 oz/week    2 Shots of liquor per week     Comment: 7-10 drinks  . Drug Use: No  . Sexual Activity: Yes    Birth Control/ Protection: None   Other Topics Concern  . None   Social History Narrative   He currently works and Freight forwarder.       Patient lives at home with his wife [name] in a one-story home.  Patient has a college education. B.S. Patient is self employed.  They have two grown daughters.   Right handed.   Caffeine- one cup   Family History  Problem Relation Age of Onset  . Kidney disease Mother   . Breast cancer Mother     Living, 63  . Diabetes Mellitus II Father     Deceased, 32  . Tremor Father   . Depression Daughter   . Healthy Brother    No Known Allergies Prior to Admission medications   Medication Sig Start Date End Date Taking? Authorizing Provider  albuterol (PROVENTIL HFA;VENTOLIN HFA) 108 (90 BASE) MCG/ACT inhaler Inhale 2 puffs into the lungs every 4 (four) hours as needed for wheezing (cough, shortness of breath or wheezing.). 12/03/12  Yes Darlyne Russian, MD  ALPRAZolam [name]) 0.5 MG tablet Take 0.5 mg by mouth at bedtime as needed for sleep.   Yes Historical Provider, MD  ibuprofen (ADVIL,MOTRIN) 200 MG tablet Take 400 mg by mouth every  6 (six) hours as needed for moderate pain.   Yes Historical Provider, MD  lamoTRIgine (LAMICTAL) 200 MG tablet Take 200 mg by mouth at bedtime.    Yes Historical Provider, MD  methocarbamol (ROBAXIN) 500 MG tablet TAKE 1 TABLET BY MOUTH EVERY 8 HOURS 05/27/13  Yes Darlyne Russian, MD  omeprazole (PRILOSEC) 20 MG capsule Take 20 mg by mouth daily.   Yes Historical Provider, MD  propranolol ER (INDERAL LA) 60 MG 24 hr capsule Take 1 capsule (60 mg total) by mouth daily. 09/09/13  Yes Alda Berthold, MD  zolpidem (AMBIEN) 5 MG tablet Take 10 mg by mouth at bedtime as needed for sleep.   Yes Historical Provider, MD     ROS: The patient denies fevers, chills, night sweats, unintentional weight loss, chest pain, palpitations, wheezing, dyspnea on exertion, nausea, vomiting, abdominal pain, dysuria, hematuria, melena, weakness  All other systems have been reviewed and were otherwise negative with the exception of those mentioned in the HPI and as above.    PHYSICAL  EXAM: Filed Vitals:   11/11/13 0914  BP: 158/94  Pulse: 67  Temp: 98.5 F (36.9 C)  Resp: 16   Filed Vitals:   11/11/13 0914  Height: '5\' 9"'  (1.753 m)  Weight: 207 lb (93.895 kg)   Body mass index is 30.55 kg/(m^2).  General: Alert, no acute distress HEENT:  Normocephalic, atraumatic, oropharynx patent. EOMI, PERRLA Cardiovascular:  Regular rate and rhythm, no rubs murmurs or gallops.  No Carotid bruits, radial pulse intact. No pedal edema.  Respiratory: Clear to auscultation bilaterally.  No wheezes, rales, or rhonchi.  No cyanosis, no use of accessory musculature GI: No organomegaly, abdomen is soft and non-tender, positive bowel sounds.  No masses. Skin: No rashes. Neurologic: Facial musculature symmetric. Neuro exam normal except CN 5, no asymm weakness. 5/5 stre/ s/2 DTRS + tender with light touch abd palpation of lower face Psychiatric: Patient is appropriate throughout our interaction. Lymphatic: No cervical lymphadenopathy Musculoskeletal: Gait intact.   LABS: Results for orders placed in visit on 08/13/13  CBC WITH DIFFERENTIAL      Result Value Ref Range   WBC 6.0  4.0 - 10.5 K/uL   RBC 4.41  4.22 - 5.81 MIL/uL   Hemoglobin 14.9  13.0 - 17.0 g/dL   HCT 41.2  39.0 - 52.0 %   MCV 93.4  78.0 - 100.0 fL   MCH 33.8  26.0 - 34.0 pg   MCHC 36.2 (*) 30.0 - 36.0 g/dL   RDW 14.2  11.5 - 15.5 %   Platelets 162  150 - 400 K/uL   Neutrophils Relative % 40 (*) 43 - 77 %   Neutro Abs 2.4  1.7 - 7.7 K/uL   Lymphocytes Relative 43  12 - 46 %   Lymphs Abs 2.6  0.7 - 4.0 K/uL   Monocytes Relative 10  3 - 12 %   Monocytes Absolute 0.6  0.1 - 1.0 K/uL   Eosinophils Relative 6 (*) 0 - 5 %   Eosinophils Absolute 0.4  0.0 - 0.7 K/uL   Basophils Relative 1  0 - 1 %   Basophils Absolute 0.1  0.0 - 0.1 K/uL   Smear Review Criteria for review not met    COMPLETE METABOLIC PANEL WITH GFR      Result Value Ref Range   Sodium 141  135 - 145 mEq/L   Potassium 4.1  3.5 - 5.3 mEq/L    Chloride 105  96 -  112 mEq/L   CO2 27  19 - 32 mEq/L   Glucose, Bld 104 (*) 70 - 99 mg/dL   BUN 8  6 - 23 mg/dL   Creat 0.66  0.50 - 1.35 mg/dL   Total Bilirubin 0.9  0.2 - 1.2 mg/dL   Alkaline Phosphatase 57  39 - 117 U/L   AST 77 (*) 0 - 37 U/L   ALT 61 (*) 0 - 53 U/L   Total Protein 7.4  6.0 - 8.3 g/dL   Albumin 4.0  3.5 - 5.2 g/dL   Calcium 8.8  8.4 - 10.5 mg/dL   GFR, Est African American >89     GFR, Est Non African American >89    TSH      Result Value Ref Range   TSH 1.632  0.350 - 4.500 uIU/mL  LIPID PANEL      Result Value Ref Range   Cholesterol 146  0 - 200 mg/dL   Triglycerides 103  <150 mg/dL   HDL 50  >39 mg/dL   Total CHOL/HDL Ratio 2.9     VLDL 21  0 - 40 mg/dL   LDL Cholesterol 75  0 - 99 mg/dL  PSA      Result Value Ref Range   PSA 0.54  <=4.00 ng/mL  POCT URINALYSIS DIPSTICK      Result Value Ref Range   Color, UA yellow     Clarity, UA clear     Glucose, UA neg     Bilirubin, UA neg     Ketones, UA neg     Spec Grav, UA 1.025     Blood, UA neg     pH, UA 6.0     Protein, UA neg     Urobilinogen, UA 1.0     Nitrite, UA neg     Leukocytes, UA Negative    IFOBT (OCCULT BLOOD)      Result Value Ref Range   IFOBT Negative       EKG/XRAY:   Primary read interpreted by Dr. Marin Comment at [facility].   ASSESSMENT/PLAN: Encounter Diagnoses  Name Primary?  . Trigeminal neuralgia of right side of face Yes  . Facial pain    Tender on Right Lower Face c/w TGN Rx hydrocodone with ibuprofen He has elevated liver enzymes Will continue with lamictal and prn Robaxen F/u prn  Gross sideeffects, risk and benefits, and alternatives of medications d/w patient. Patient is aware that all medications have potential sideeffects and we are unable to predict every sideeffect or drug-drug interaction that may occur.  LE, [place], DO 11/11/2013 10:40 AM

## 2013-11-11 NOTE — Patient Instructions (Signed)
Trigeminal Neuralgia  Trigeminal neuralgia is a nerve disorder that causes sudden attacks of severe facial pain. It is caused by damage to the trigeminal nerve, a major nerve in the face. It is more common in women and in the elderly, although it can also happen in younger patients. Attacks last from a few seconds to several minutes and can occur from a couple of times per year to several times per day. Trigeminal neuralgia can be a very distressing and disabling condition. Surgery may be needed in very severe cases if medical treatment does not give relief.  HOME CARE INSTRUCTIONS    If your caregiver prescribed medication to help prevent attacks, take as directed.   To help prevent attacks:   Chew on the unaffected side of the mouth.   Avoid touching your face.   Avoid blasts of hot or cold air.   Men may wish to grow a beard to avoid having to shave.  SEEK IMMEDIATE MEDICAL CARE IF:   Pain is unbearable and your medicine does not help.   You develop new, unexplained symptoms (problems).   You have problems that may be related to a medication you are taking.  Document Released: 05/03/2000 Document Revised: 07/29/2011 Document Reviewed: 03/03/2009  [employer] Patient Information 2015 [employer]. This information is not intended to replace advice given to you by your health care provider. Make sure you discuss any questions you have with your health care provider.

## 2013-11-11 NOTE — ED Notes (Signed)
Pt states he has trigeminal neuralgia and he needs some short term pain medication until he can get in to see his neurologist   Pt talked to his Dr Everlene Farrier and was told to come in to the ed for treatment

## 2013-11-12 ENCOUNTER — Telehealth: Payer: Self-pay

## 2013-11-12 NOTE — Telephone Encounter (Signed)
Pt is needing something stronger for pain

## 2013-11-13 NOTE — Telephone Encounter (Signed)
Pt wants to know if you can rx him Vicoprofen 10/200? Says the 5 mg is not helping all that much.

## 2013-11-13 NOTE — Telephone Encounter (Signed)
I sent this to Dr. Marin Comment, is there anything else you recommend?

## 2013-11-13 NOTE — Telephone Encounter (Signed)
He can take 2 vicoprofen every 8 hours as needed (he was initially rx'd 1 tab q8h prn)  Await response from Dr. Marin Comment.  If his pain is uncontrolled with 2 vicoprofen he should RTC for further eval

## 2013-11-13 NOTE — Telephone Encounter (Signed)
LMOM of this info 

## 2013-11-13 NOTE — Telephone Encounter (Signed)
Patient wife called back wanting to know if we had a response from the doctor about the pain medicine. I told her we have not. She wants to know if he can get the medicine today since he is having at least 2 pain attacks per day.

## 2013-11-16 ENCOUNTER — Ambulatory Visit (INDEPENDENT_AMBULATORY_CARE_PROVIDER_SITE_OTHER): Payer: 59 | Admitting: Family Medicine

## 2013-11-16 ENCOUNTER — Encounter: Payer: 59 | Admitting: Cardiovascular Disease

## 2013-11-16 ENCOUNTER — Telehealth: Payer: Self-pay

## 2013-11-16 VITALS — BP 148/90 | HR 69 | Resp 16

## 2013-11-16 DIAGNOSIS — R519 Headache, unspecified: Secondary | ICD-10-CM

## 2013-11-16 DIAGNOSIS — G500 Trigeminal neuralgia: Secondary | ICD-10-CM

## 2013-11-16 DIAGNOSIS — R51 Headache: Secondary | ICD-10-CM

## 2013-11-16 MED ORDER — OXYCODONE HCL 5 MG PO TABS: 5.0000 mg | ORAL_TABLET | Freq: Three times a day (TID) | ORAL | Status: DC | PRN

## 2013-11-16 MED ORDER — PREGABALIN 75 MG PO CAPS: 75.0000 mg | ORAL_CAPSULE | Freq: Two times a day (BID) | ORAL | Status: DC

## 2013-11-16 NOTE — Progress Notes (Signed)
Chief Complaint:  Chief Complaint  Patient presents with  . Follow-up    HPI: Gabriel Richardson is a 68 y.o. male who is here for similar sxs of trigmenial neuralgia, not better, he was given hydrocodone by me and was also on lamictal and robaxin, and that does not seem tor have any relief of his pain. He states the Percocet works better for him and he has tried Consulting civil engineer and also neurontin in thee past. He had nausea with the percocet in the ER when they gave it to him but this was on an empty stomach. He would like to try a stronger pain  Medication since the hydrocodone was not working. He has been taking motrin and that is not touching it. He has had neurontin/gabapentin before and the SEs made him sick. He has had lyrica after that without any problems.    LAst OV on 11/11/13 Gabriel Richardson is a 68 y.o. male who is here for Right sided Facial pain, he had an episode of neuralgia 21 years ago, hehad sxs similar agoan last night, he was originally onnuerontin and was switched to Lyrica. He went to [facility] ER and was given percocet, and since he was on empty and got sick. He was given only 1 percocet at 1:30 am. Did not want to stay since he was not going to be seen for another hour. He has 10+/10 pain that will bring him to his knees, his male neurologist told him that it was 10/10 labor pain as was told to the patient. He is havng some blurred visiona nd difficulty keeping his balance. HE is very dehydrated after vomiting, x 2 epsiodes. HE deneis stroke like sxs. He has just pain in the right side of his face. HE denies stroke like sxs , CP , SOB   Past Medical History  Diagnosis Date  . Depression   . Anxiety   . COPD (chronic obstructive pulmonary disease)   . Cervical stenosis of spinal canal   . Asthma   . Emphysema of lung   . Neuromuscular disorder    Past Surgical History  Procedure Laterality Date  . Spine surgery      Cervical C3-C5 anterior fusion  . Lumbar laminectomy    .  Cervical fusion     History   Social History  . Marital Status: Married    Spouse Name: [name]    Number of Children: 2  . Years of Education: BS   Occupational History  .      Works for himself   Social History Main Topics  . Smoking status: Former Smoker -- 1.50 packs/day for 25 years    Types: Cigarettes    Quit date: 05/20/1997  . Smokeless tobacco: Never Used  . Alcohol Use: 1.2 oz/week    2 Shots of liquor per week     Comment: 7-10 drinks  . Drug Use: No  . Sexual Activity: Yes    Birth Control/ Protection: None   Other Topics Concern  . None   Social History Narrative   He currently works and Freight forwarder.       Patient lives at home with his wife [name] in a one-story home.  Patient has a college education. B.S. Patient is self employed.  They have two grown daughters.   Right handed.   Caffeine- one cup   Family History  Problem Relation Age of Onset  . Kidney disease Mother   . Breast  cancer Mother     Living, 34  . Diabetes Mellitus II Father     Deceased, 16  . Tremor Father   . Depression Daughter   . Healthy Brother    No Known Allergies Prior to Admission medications   Medication Sig Start Date End Date Taking? Authorizing Provider  albuterol (PROVENTIL HFA;VENTOLIN HFA) 108 (90 BASE) MCG/ACT inhaler Inhale 2 puffs into the lungs every 4 (four) hours as needed for wheezing (cough, shortness of breath or wheezing.). 12/03/12   Darlyne Russian, MD  ALPRAZolam [name]) 0.5 MG tablet Take 0.5 mg by mouth at bedtime as needed for sleep.    Historical Provider, MD  hydrocodone-ibuprofen (VICOPROFEN) 5-200 MG per tablet Take 1 tablet by mouth every 8 (eight) hours as needed for pain. 11/11/13   Zera Markwardt P Norvin Ohlin, DO  ibuprofen (ADVIL,MOTRIN) 200 MG tablet Take 400 mg by mouth every 6 (six) hours as needed for moderate pain.    Historical Provider, MD  lamoTRIgine (LAMICTAL) 200 MG tablet Take 200 mg by mouth at bedtime.     Historical Provider, MD    methocarbamol (ROBAXIN) 500 MG tablet TAKE 1 TABLET BY MOUTH EVERY 8 HOURS 05/27/13   Darlyne Russian, MD  omeprazole (PRILOSEC) 20 MG capsule Take 20 mg by mouth daily.    Historical Provider, MD  ondansetron (ZOFRAN) 4 MG tablet Take 1 tablet (4 mg total) by mouth every 8 (eight) hours as needed for nausea or vomiting. 11/11/13   Seda Kronberg P Baylie Drakes, DO  propranolol ER (INDERAL LA) 60 MG 24 hr capsule Take 1 capsule (60 mg total) by mouth daily. 09/09/13   Alda Berthold, MD  zolpidem (AMBIEN) 5 MG tablet Take 10 mg by mouth at bedtime as needed for sleep.    Historical Provider, MD     ROS: The patient denies fevers, chills, night sweats, unintentional weight loss, chest pain, palpitations, wheezing, dyspnea on exertion, nausea, vomiting, abdominal pain, dysuria, hematuria, melena  All other systems have been reviewed and were otherwise negative with the exception of those mentioned in the HPI and as above.    PHYSICAL EXAM: Filed Vitals:   11/16/13 1450  BP: 148/90  Pulse: 69  Resp: 16   There were no vitals filed for this visit. There is no weight on file to calculate BMI.  General: Alert, no acute distress HEENT:  Normocephalic, atraumatic, oropharynx patent. EOMI, PERRLA Cardiovascular:  Regular rate and rhythm, no rubs murmurs or gallops.  No Carotid bruits, radial pulse intact. No pedal edema.  Respiratory: Clear to auscultation bilaterally.  No wheezes, rales, or rhonchi.  No cyanosis, no use of accessory musculature GI: No organomegaly, abdomen is soft and non-tender, positive bowel sounds.  No masses. Skin: No rashes. Neurologic: Facial musculature symmetric. + right sided facial pain with light touch Psychiatric: Patient is appropriate throughout our interaction. Lymphatic: No cervical lymphadenopathy Musculoskeletal: Gait intact.   LABS: Results for orders placed in visit on 08/13/13  CBC WITH DIFFERENTIAL      Result Value Ref Range   WBC 6.0  4.0 - 10.5 K/uL   RBC 4.41  4.22  - 5.81 MIL/uL   Hemoglobin 14.9  13.0 - 17.0 g/dL   HCT 41.2  39.0 - 52.0 %   MCV 93.4  78.0 - 100.0 fL   MCH 33.8  26.0 - 34.0 pg   MCHC 36.2 (*) 30.0 - 36.0 g/dL   RDW 14.2  11.5 - 15.5 %   Platelets 162  150 - 400 K/uL   Neutrophils Relative % 40 (*) 43 - 77 %   Neutro Abs 2.4  1.7 - 7.7 K/uL   Lymphocytes Relative 43  12 - 46 %   Lymphs Abs 2.6  0.7 - 4.0 K/uL   Monocytes Relative 10  3 - 12 %   Monocytes Absolute 0.6  0.1 - 1.0 K/uL   Eosinophils Relative 6 (*) 0 - 5 %   Eosinophils Absolute 0.4  0.0 - 0.7 K/uL   Basophils Relative 1  0 - 1 %   Basophils Absolute 0.1  0.0 - 0.1 K/uL   Smear Review Criteria for review not met    COMPLETE METABOLIC PANEL WITH GFR      Result Value Ref Range   Sodium 141  135 - 145 mEq/L   Potassium 4.1  3.5 - 5.3 mEq/L   Chloride 105  96 - 112 mEq/L   CO2 27  19 - 32 mEq/L   Glucose, Bld 104 (*) 70 - 99 mg/dL   BUN 8  6 - 23 mg/dL   Creat 4.38  3.81 - 8.40 mg/dL   Total Bilirubin 0.9  0.2 - 1.2 mg/dL   Alkaline Phosphatase 57  39 - 117 U/L   AST 77 (*) 0 - 37 U/L   ALT 61 (*) 0 - 53 U/L   Total Protein 7.4  6.0 - 8.3 g/dL   Albumin 4.0  3.5 - 5.2 g/dL   Calcium 8.8  8.4 - 37.5 mg/dL   GFR, Est African American >89     GFR, Est Non African American >89    TSH      Result Value Ref Range   TSH 1.632  0.350 - 4.500 uIU/mL  LIPID PANEL      Result Value Ref Range   Cholesterol 146  0 - 200 mg/dL   Triglycerides 436  <067 mg/dL   HDL 50  >70 mg/dL   Total CHOL/HDL Ratio 2.9     VLDL 21  0 - 40 mg/dL   LDL Cholesterol 75  0 - 99 mg/dL  PSA      Result Value Ref Range   PSA 0.54  <=4.00 ng/mL  POCT URINALYSIS DIPSTICK      Result Value Ref Range   Color, UA yellow     Clarity, UA clear     Glucose, UA neg     Bilirubin, UA neg     Ketones, UA neg     Spec Grav, UA 1.025     Blood, UA neg     pH, UA 6.0     Protein, UA neg     Urobilinogen, UA 1.0     Nitrite, UA neg     Leukocytes, UA Negative    IFOBT (OCCULT BLOOD)       Result Value Ref Range   IFOBT Negative       EKG/XRAY:   Primary read interpreted by Dr. Conley Rolls at [facility].   ASSESSMENT/PLAN: Encounter Diagnoses  Name Primary?  . Facial pain Yes  . Trigeminal neuralgia    Rx oxycodone Advise to monitor for SEs.  Rx Lyrica, he has had aderse SEs with gabapentin and Lyrica has worked before.  F/u prn  Gross sideeffects, risk and benefits, and alternatives of medications d/w patient. Patient is aware that all medications have potential sideeffects and we are unable to predict every sideeffect or drug-drug interaction that may occur.  Hamilton Capri PHUONG, DO 11/16/2013 3:27  PM

## 2013-11-16 NOTE — Telephone Encounter (Signed)
Pt called and requested to talk to a nurse about a medication recently prescribed by Dr. Marin Comment, " only briefly relieving the symptom," he is concerned that he will not be able to get in to see Dr.Le today, and he is having a lot of trouble with his "symptoms", would like to know if Dr. Marin Comment has a back up treatment plan for him, or something else that could be called in. Please advice pt.

## 2013-11-16 NOTE — Telephone Encounter (Signed)
Pt checked in at the clinic at 2:45pm to see Dr. Marin Comment today.

## 2013-11-17 NOTE — Progress Notes (Signed)
Patient ID: Gabriel Richardson, male   DOB: [birth year], 68 y.o.   MRN: [id number]

## 2013-11-19 ENCOUNTER — Telehealth: Payer: Self-pay

## 2013-11-19 NOTE — Telephone Encounter (Signed)
Ins will not cover Lyrica unless pt has first tried preferred medications. Gabapentin is probably the preferred. Can we try that or something else for pt?

## 2013-11-22 NOTE — Telephone Encounter (Signed)
Prior Gabriel Richardson is not found pending.

## 2013-11-22 NOTE — Telephone Encounter (Signed)
You can discontinue the preauth for the Lyrica, I spoke with him and he is 100% better after just 1 oxycodone so I told him we will do the Lyrica rx pre auth if he has this again.

## 2013-11-29 ENCOUNTER — Other Ambulatory Visit: Payer: Self-pay | Admitting: Neurology

## 2013-11-29 ENCOUNTER — Other Ambulatory Visit: Payer: Self-pay | Admitting: *Deleted

## 2013-11-29 DIAGNOSIS — R0602 Shortness of breath: Secondary | ICD-10-CM

## 2013-11-29 MED ORDER — PROPRANOLOL HCL ER 60 MG PO CP24: 60.0000 mg | ORAL_CAPSULE | Freq: Every day | ORAL | Status: DC

## 2013-11-29 NOTE — Telephone Encounter (Signed)
Order sent.

## 2013-11-29 NOTE — Telephone Encounter (Signed)
Last filled in April.  Has to be 90 day supply.  Ok to refill?

## 2013-12-06 ENCOUNTER — Other Ambulatory Visit: Payer: Self-pay | Admitting: Neurology

## 2013-12-06 NOTE — Telephone Encounter (Signed)
Last appointment 04-2013.  How many refills?

## 2013-12-07 ENCOUNTER — Other Ambulatory Visit: Payer: Self-pay | Admitting: *Deleted

## 2013-12-07 MED ORDER — PROPRANOLOL HCL ER 60 MG PO CP24: 60.0000 mg | ORAL_CAPSULE | Freq: Every day | ORAL | Status: DC

## 2013-12-07 NOTE — Telephone Encounter (Signed)
90-day supply.  Please let him know he needs to f/u prior to next refill.  Donika K. Posey Pronto, DO

## 2013-12-27 ENCOUNTER — Telehealth: Payer: Self-pay

## 2013-12-27 NOTE — Telephone Encounter (Signed)
Dr Marin Comment, see notes under 11/19/13 phone mes, Lyrica requires a PA and pt has not ever tried the alternatives which probably include gabapentin. PA will not be approved w/out a trial of preferred alt. Do you want to try gabapentin or another med that might be covered?

## 2013-12-30 NOTE — Telephone Encounter (Signed)
Gabriel Richardson does not want to try lyrica, he states the pain meds I have given him are sufficient if he ahs no releif and sxs return then we will tyr something else. Thanks. Dr Marin Comment

## 2013-12-31 NOTE — Telephone Encounter (Signed)
Notified pharmacy pt does not want Lyrica at this time.

## 2014-01-04 ENCOUNTER — Ambulatory Visit (INDEPENDENT_AMBULATORY_CARE_PROVIDER_SITE_OTHER): Payer: 59 | Admitting: Emergency Medicine

## 2014-01-04 ENCOUNTER — Ambulatory Visit (INDEPENDENT_AMBULATORY_CARE_PROVIDER_SITE_OTHER): Payer: 59

## 2014-01-04 VITALS — BP 128/76 | HR 80 | Temp 98.4°F | Resp 16 | Ht 68.0 in | Wt 204.0 lb

## 2014-01-04 DIAGNOSIS — M79609 Pain in unspecified limb: Secondary | ICD-10-CM

## 2014-01-04 DIAGNOSIS — M79662 Pain in left lower leg: Secondary | ICD-10-CM

## 2014-01-04 MED ORDER — MUPIROCIN 2 % EX OINT: TOPICAL_OINTMENT | CUTANEOUS | Status: DC

## 2014-01-04 NOTE — Progress Notes (Addendum)
Subjective:  This chart was scribed for Gabriel Queen, MD by Gabriel Richardson, Medical Scribe. This patient was seen in Room 11 and the patient's care was started at 8:29 AM.   Patient ID: Gabriel Richardson, male    DOB: 1945/12/23, 68 y.o.   MRN: [id number]  HPI HPI Comments: Gabriel Richardson is a 68 y.o. male who presents to the [facility] complaining of constant left leg pain with associated swelling that started five days ago after he bumped into a closed, 5 gallon bucket of glue.  There are also two abrasions on the left leg where the patient bumped into the bucket.  He thinks his TDAP is UTD.     Past Medical History  Diagnosis Date  . Depression   . Anxiety   . COPD (chronic obstructive pulmonary disease)   . Cervical stenosis of spinal canal   . Asthma   . Emphysema of lung   . Neuromuscular disorder    Past Surgical History  Procedure Laterality Date  . Spine surgery      Cervical C3-C5 anterior fusion  . Lumbar laminectomy    . Cervical fusion     Family History  Problem Relation Age of Onset  . Kidney disease Mother   . Breast cancer Mother     Living, 12  . Diabetes Mellitus II Father     Deceased, 22  . Tremor Father   . Depression Daughter   . Healthy Brother    History   Social History  . Marital Status: Married    Spouse Name: Gabriel Richardson    Number of Children: 2  . Years of Education: BS   Occupational History  .      Works for himself   Social History Main Topics  . Smoking status: Former Smoker -- 1.50 packs/day for 25 years    Types: Cigarettes    Quit date: 05/20/1997  . Smokeless tobacco: Never Used  . Alcohol Use: 1.2 oz/week    2 Shots of liquor per week     Comment: 7-10 drinks  . Drug Use: No  . Sexual Activity: Yes    Birth Control/ Protection: None   Other Topics Concern  . Not on file   Social History Narrative   He currently works and Freight forwarder.       Patient lives at home with his wife Gabriel Richardson in a  one-story home.  Patient has a college education. B.S. Patient is self employed.  They have two grown daughters.   Right handed.   Caffeine- one cup   No Known Allergies  Review of Systems  Musculoskeletal: Positive for arthralgias and joint swelling.  Skin: Positive for wound.     Objective:  Physical Exam  Nursing note and vitals reviewed. Constitutional: He is oriented to person, place, and time. He appears well-developed and well-nourished.  HENT:  Head: Normocephalic and atraumatic.  Eyes: EOM are normal.  Neck: Normal range of motion.  Cardiovascular: Normal rate.   Pulmonary/Chest: Effort normal.  Musculoskeletal: Normal range of motion.  Neurological: He is alert and oriented to person, place, and time.  Skin: Skin is warm and dry.  Left shin: 1x2cm triangular area of skin loss.  No surrounding redness or purulent drainage.    Psychiatric: He has a normal mood and affect. His behavior is normal.   there is also a 1 cm superficial wound proximal left tibia. There is no purulence around this area.  [facility]  preliminary x-ray report read by Dr. Everlene Richardson: There are actually 3 small suspected foreign bodies present over the left shin each of these measures approximately 1-2 mm. No fracture is seen   BP 128/76  Pulse 80  Temp(Src) 98.4 F (36.9 C) (Oral)  Resp 16  Ht 5\' 8"  (1.727 m)  Wt 204 lb (92.534 kg)  BMI 31.03 kg/m2  SpO2 97% Assessment & Plan:  I called the patient went on there appear to be some tiny 1-2 mm foreign bodies present in his wound areas. He stated he was too busy to return to clinic now. He states he will be here first thing in the morning.  He .is up-to-date he received his last tetanus immunization in 2007. He will apply Bactroban ointment if the Xeroform that was applied comes off. The patient stated he could not stay and had a conference call and left before the x-ray reading was back and so he was called regarding these findings. He stated he had previous  commitments and could not come in this morning or this afternoon and would have to come tomorrow morning. Xeroform gauze was applied to the distal lesion.  I personally performed the services described in this documentation, which was scribed in my presence. The recorded information has been reviewed and is accurate.

## 2014-01-04 NOTE — Progress Notes (Deleted)
   Subjective:    Patient ID: Gabriel Richardson, male    DOB: 1946/01/06, 68 y.o.   MRN: [id number]  HPI    Review of Systems     Objective:   Physical Exam        Assessment & Plan:

## 2014-01-05 ENCOUNTER — Ambulatory Visit (INDEPENDENT_AMBULATORY_CARE_PROVIDER_SITE_OTHER): Payer: 59 | Admitting: Emergency Medicine

## 2014-01-05 ENCOUNTER — Ambulatory Visit (INDEPENDENT_AMBULATORY_CARE_PROVIDER_SITE_OTHER): Payer: 59

## 2014-01-05 VITALS — BP 128/72 | HR 85 | Temp 98.2°F | Resp 18 | Ht 64.0 in | Wt 208.0 lb

## 2014-01-05 DIAGNOSIS — M79662 Pain in left lower leg: Secondary | ICD-10-CM

## 2014-01-05 DIAGNOSIS — M79609 Pain in unspecified limb: Secondary | ICD-10-CM

## 2014-01-05 NOTE — Progress Notes (Signed)
Subjective:  This chart was scribed for Darlyne Russian, MD by Ladene Artist, ED Scribe. The patient was seen in room 13. Patient's care was started at 8:55 AM.   Patient ID: Gabriel Richardson, male    DOB: [birth year], 68 y.o.   MRN: [id number]  Chief Complaint  Patient presents with  . Follow-up    with Dr Everlene Farrier   HPI HPI Comments: Gabriel Richardson is a 68 y.o. male who presents to the [facility] for follow-up regarding wound to L lower leg. Pt was seen yesterday by Dr. Everlene Farrier for L lower leg pain and swelling after hitting a 5 gallon bucket of glue 5 days prior. There was a question of foreign body seen on his XRs. Pt returns today for a recheck.   Past Medical History  Diagnosis Date  . Depression   . Anxiety   . COPD (chronic obstructive pulmonary disease)   . Cervical stenosis of spinal canal   . Asthma   . Emphysema of lung   . Neuromuscular disorder    Current Outpatient Prescriptions on File Prior to Visit  Medication Sig Dispense Refill  . albuterol (PROVENTIL HFA;VENTOLIN HFA) 108 (90 BASE) MCG/ACT inhaler Inhale 2 puffs into the lungs every 4 (four) hours as needed for wheezing (cough, shortness of breath or wheezing.).  3 Inhaler  1  . ALPRAZolam (XANAX) 0.5 MG tablet Take 0.5 mg by mouth at bedtime as needed for sleep.      Marland Kitchen ibuprofen (ADVIL,MOTRIN) 200 MG tablet Take 400 mg by mouth every 6 (six) hours as needed for moderate pain.      Marland Kitchen lamoTRIgine (LAMICTAL) 200 MG tablet Take 200 mg by mouth at bedtime.       . methocarbamol (ROBAXIN) 500 MG tablet TAKE 1 TABLET BY MOUTH EVERY 8 HOURS  60 tablet  3  . mupirocin ointment (BACTROBAN) 2 % Applied to wounds twice a day .  22 g  0  . omeprazole (PRILOSEC) 20 MG capsule Take 20 mg by mouth daily.      . ondansetron (ZOFRAN) 4 MG tablet Take 1 tablet (4 mg total) by mouth every 8 (eight) hours as needed for nausea or vomiting.  20 tablet  0  . oxyCODONE (OXY IR/ROXICODONE) 5 MG immediate release tablet Take 1  tablet (5 mg total) by mouth every 8 (eight) hours as needed for severe pain. Take with food and a stool softener with this please!!  60 tablet  0  . pregabalin (LYRICA) 75 MG capsule Take 1 capsule (75 mg total) by mouth 2 (two) times daily.  60 capsule  2  . propranolol ER (INDERAL LA) 60 MG 24 hr capsule TAKE 1 CAPSULE (60 MG TOTAL) BY MOUTH DAILY.  90 capsule  0  . propranolol ER (INDERAL LA) 60 MG 24 hr capsule Take 1 capsule (60 mg total) by mouth daily.  90 capsule  0  . zolpidem (AMBIEN) 5 MG tablet Take 10 mg by mouth at bedtime as needed for sleep.       No current facility-administered medications on file prior to visit.   No Known Allergies  Review of Systems  Skin: Positive for wound.      Objective:   Physical Exam CONSTITUTIONAL: Well developed/well nourished HEAD: Normocephalic/atraumatic EYES: EOMI/PERRL ENMT: Mucous membranes moist NECK: supple no meningeal signs SPINE:entire spine nontender CV: S1/S2 noted, no murmurs/rubs/gallops noted LUNGS: Lungs are clear to auscultation bilaterally, no apparent distress ABDOMEN: soft,  nontender, no rebound or guarding GU:no cva tenderness NEURO: Pt is awake/alert, moves all extremitiesx4 EXTREMITIES: pulses normal, full ROM SKIN: warm, color normal, L shin: 1 cm superficial laceration proximal L shin, 1.5x2 cm open area L mid shin PSYCH: no abnormalities of mood noted   [facility] reading (PRIMARY) by  Dr.Daub the markers  Show  that the calcific densities are not at the area of injury.   Assessment & Plan:  Proximal wound covered with Bactroban ointment to the wound covered with Xeroform. He is to keep the areas clean with soap and water and elevate his leg whenever possible to I personally performed the services described in this documentation, which was scribed in my presence. The recorded information has been reviewed and is accurate.

## 2014-01-05 NOTE — Progress Notes (Signed)
Subjective:  This chart was scribed for Darlyne Russian, MD by Ladene Artist, ED Scribe. The patient was seen in room 13. Patient's care was started at 8:55 AM.   Patient ID: Gabriel Richardson, male    DOB: Oct 03, 1945, 68 y.o.   MRN: [id number]  Chief Complaint  Patient presents with   Follow-up    with Dr Everlene Farrier   HPI HPI Comments: Gabriel Richardson is a 68 y.o. male who presents to the [facility] for follow-up regarding wound to L lower leg. Pt was seen yesterday by Dr. Everlene Farrier for L lower leg pain and swelling after hitting a 5 gallon bucket of glue 5 days prior. There was a question of foreign body seen on his XRs. Pt returns today for a recheck.   Past Medical History  Diagnosis Date   Depression    Anxiety    COPD (chronic obstructive pulmonary disease)    Cervical stenosis of spinal canal    Asthma    Emphysema of lung    Neuromuscular disorder    Current Outpatient Prescriptions on File Prior to Visit  Medication Sig Dispense Refill   albuterol (PROVENTIL HFA;VENTOLIN HFA) 108 (90 BASE) MCG/ACT inhaler Inhale 2 puffs into the lungs every 4 (four) hours as needed for wheezing (cough, shortness of breath or wheezing.).  3 Inhaler  1   ALPRAZolam (XANAX) 0.5 MG tablet Take 0.5 mg by mouth at bedtime as needed for sleep.       ibuprofen (ADVIL,MOTRIN) 200 MG tablet Take 400 mg by mouth every 6 (six) hours as needed for moderate pain.       lamoTRIgine (LAMICTAL) 200 MG tablet Take 200 mg by mouth at bedtime.        methocarbamol (ROBAXIN) 500 MG tablet TAKE 1 TABLET BY MOUTH EVERY 8 HOURS  60 tablet  3   mupirocin ointment (BACTROBAN) 2 % Applied to wounds twice a day .  22 g  0   omeprazole (PRILOSEC) 20 MG capsule Take 20 mg by mouth daily.       ondansetron (ZOFRAN) 4 MG tablet Take 1 tablet (4 mg total) by mouth every 8 (eight) hours as needed for nausea or vomiting.  20 tablet  0   oxyCODONE (OXY IR/ROXICODONE) 5 MG immediate release tablet Take 1  tablet (5 mg total) by mouth every 8 (eight) hours as needed for severe pain. Take with food and a stool softener with this please!!  60 tablet  0   pregabalin (LYRICA) 75 MG capsule Take 1 capsule (75 mg total) by mouth 2 (two) times daily.  60 capsule  2   propranolol ER (INDERAL LA) 60 MG 24 hr capsule TAKE 1 CAPSULE (60 MG TOTAL) BY MOUTH DAILY.  90 capsule  0   propranolol ER (INDERAL LA) 60 MG 24 hr capsule Take 1 capsule (60 mg total) by mouth daily.  90 capsule  0   zolpidem (AMBIEN) 5 MG tablet Take 10 mg by mouth at bedtime as needed for sleep.       No current facility-administered medications on file prior to visit.   No Known Allergies  Review of Systems  Skin: Positive for wound.      Objective:   Physical Exam CONSTITUTIONAL: Well developed/well nourished HEAD: Normocephalic/atraumatic EYES: EOMI/PERRL ENMT: Mucous membranes moist NECK: supple no meningeal signs SPINE:entire spine nontender CV: S1/S2 noted, no murmurs/rubs/gallops noted LUNGS: Lungs are clear to auscultation bilaterally, no apparent distress ABDOMEN: soft,  nontender, no rebound or guarding GU:no cva tenderness NEURO: Pt is awake/alert, moves all extremitiesx4 EXTREMITIES: pulses normal, full ROM SKIN: warm, color normal, L shin: 1 cm superficial laceration proximal L shin, 1.5x2 cm open area L mid shin PSYCH: no abnormalities of mood noted    Assessment & Plan:   I personally performed the services described in this documentation, which was scribed in my presence. The recorded information has been reviewed and is accurate.

## 2014-02-25 ENCOUNTER — Encounter: Payer: Self-pay | Admitting: *Deleted

## 2014-03-04 ENCOUNTER — Other Ambulatory Visit: Payer: Self-pay | Admitting: Neurology

## 2014-03-07 ENCOUNTER — Other Ambulatory Visit: Payer: Self-pay | Admitting: *Deleted

## 2014-03-07 MED ORDER — PROPRANOLOL HCL ER 60 MG PO CP24: 60.0000 mg | ORAL_CAPSULE | Freq: Every day | ORAL | Status: DC

## 2014-03-07 NOTE — Telephone Encounter (Signed)
He was seen last December.  We called in #90 in April.  He has no follow up appointment scheduled.  Please advise.

## 2014-03-07 NOTE — Telephone Encounter (Signed)
Rx sent 

## 2014-03-07 NOTE — Telephone Encounter (Signed)
He says he has to have #90 for insurance to pay for it.

## 2014-04-03 ENCOUNTER — Other Ambulatory Visit: Payer: Self-pay | Admitting: Emergency Medicine

## 2014-09-07 ENCOUNTER — Ambulatory Visit (INDEPENDENT_AMBULATORY_CARE_PROVIDER_SITE_OTHER): Payer: 59 | Admitting: Emergency Medicine

## 2014-09-07 ENCOUNTER — Ambulatory Visit (INDEPENDENT_AMBULATORY_CARE_PROVIDER_SITE_OTHER): Payer: 59

## 2014-09-07 VITALS — BP 128/82 | HR 82 | Temp 98.7°F | Resp 18 | Ht 68.5 in | Wt 202.0 lb

## 2014-09-07 DIAGNOSIS — M5442 Lumbago with sciatica, left side: Secondary | ICD-10-CM

## 2014-09-07 DIAGNOSIS — M25552 Pain in left hip: Secondary | ICD-10-CM

## 2014-09-07 MED ORDER — HYDROCODONE-ACETAMINOPHEN 5-325 MG PO TABS: 1.0000 | ORAL_TABLET | Freq: Four times a day (QID) | ORAL | Status: DC | PRN

## 2014-09-07 MED ORDER — PREDNISONE 10 MG PO TABS: ORAL_TABLET | ORAL | Status: DC

## 2014-09-07 NOTE — Patient Instructions (Signed)
Back Pain, Adult Low back pain is very common. About 1 in 5 people have back pain.The cause of low back pain is rarely dangerous. The pain often gets better over time.About half of people with a sudden onset of back pain feel better in just 2 [place]. About 8 in 10 people feel better by 6 [place].  CAUSES Some common causes of back pain include:  Strain of the muscles or ligaments supporting the spine.  Wear and tear (degeneration) of the spinal discs.  Arthritis.  Direct injury to the back. DIAGNOSIS Most of the time, the direct cause of low back pain is not known.However, back pain can be treated effectively even when the exact cause of the pain is unknown.Answering your caregiver's questions about your overall health and symptoms is one of the most accurate ways to make sure the cause of your pain is not dangerous. If your caregiver needs more information, he or she may order lab work or imaging tests (X-rays or MRIs).However, even if imaging tests show changes in your back, this usually does not require surgery. HOME CARE INSTRUCTIONS For many people, back pain returns.Since low back pain is rarely dangerous, it is often a condition that people can learn to manageon their own.   Remain active. It is stressful on the back to sit or stand in one place. Do not sit, drive, or stand in one place for more than 30 minutes at a time. Take short walks on level surfaces as soon as pain allows.Try to increase the length of time you walk each day.  Do not stay in bed.Resting more than 1 or 2 days can delay your recovery.  Do not avoid exercise or work.Your body is made to move.It is not dangerous to be active, even though your back may hurt.Your back will likely heal faster if you return to being active before your pain is gone.  Pay attention to your body when you bend and lift. Many people have less discomfortwhen lifting if they bend their knees, keep the load close to their bodies,and  avoid twisting. Often, the most comfortable positions are those that put less stress on your recovering back.  Find a comfortable position to sleep. Use a firm mattress and lie on your side with your knees slightly bent. If you lie on your back, put a pillow under your knees.  Only take over-the-counter or prescription medicines as directed by your caregiver. Over-the-counter medicines to reduce pain and inflammation are often the most helpful.Your caregiver may prescribe muscle relaxant drugs.These medicines help dull your pain so you can more quickly return to your normal activities and healthy exercise.  Put ice on the injured area.  Put ice in a plastic bag.  Place a towel between your skin and the bag.  Leave the ice on for 15-20 minutes, 03-04 times a day for the first 2 to 3 days. After that, ice and heat may be alternated to reduce pain and spasms.  Ask your caregiver about trying back exercises and gentle massage. This may be of some benefit.  Avoid feeling anxious or stressed.Stress increases muscle tension and can worsen back pain.It is important to recognize when you are anxious or stressed and learn ways to manage it.Exercise is a great option. SEEK MEDICAL CARE IF:  You have pain that is not relieved with rest or medicine.  You have pain that does not improve in 1 week.  You have new symptoms.  You are generally not feeling well. SEEK   IMMEDIATE MEDICAL CARE IF:   You have pain that radiates from your back into your legs.  You develop new bowel or bladder control problems.  You have unusual weakness or numbness in your arms or legs.  You develop nausea or vomiting.  You develop abdominal pain.  You feel faint. Document Released: 05/06/2005 Document Revised: 11/05/2011 Document Reviewed: 09/07/2013 ExitCare Patient Information 2015 [employer]. This information is not intended to replace advice given to you by your health care provider. Make sure you  discuss any questions you have with your health care provider.  

## 2014-09-07 NOTE — Progress Notes (Addendum)
Subjective:  This chart was scribed for [name], MD by Donato Schultz, Medical Scribe. This patient was seen in Room 10 and the patient's care was started at 8:11 AM.   Patient ID: Gabriel Richardson, male    DOB: 08-19-1945, 69 y.o.   MRN: [id number]  HPI HPI Comments: Gabriel Richardson is a 69 y.o. male who presents to the [facility] complaining of constant, severe, sharp left hip pain with associated numbness throughout his legs bilaterally.  He states that his pain has worsened over the past 4-5 months.  He denies any recent injury but states he can touch a specific point on his hip and reproduce his pain.  The pain does not radiate down his legs.  He has been taking a fistful of Ibuprofen with no relief to his symptoms.  He does not have an orthopedist.  His last PSA was 0.54 a year ago.   Past Medical History  Diagnosis Date  . Depression   . Anxiety   . COPD (chronic obstructive pulmonary disease)   . Cervical stenosis of spinal canal   . Asthma   . Emphysema of lung   . Neuromuscular disorder    Past Surgical History  Procedure Laterality Date  . Spine surgery      Cervical C3-C5 anterior fusion  . Lumbar laminectomy    . Cervical fusion     Family History  Problem Relation Age of Onset  . Kidney disease Mother   . Breast cancer Mother     Living, 60  . Diabetes Mellitus II Father     Deceased, 62  . Tremor Father   . Depression Daughter   . Healthy Brother    History   Social History  . Marital Status: Married    Spouse Name: [name]  . Number of Children: 2  . Years of Education: BS   Occupational History  .      Works for himself   Social History Main Topics  . Smoking status: Former Smoker -- 1.50 packs/day for 25 years    Types: Cigarettes    Quit date: 05/20/1997  . Smokeless tobacco: Never Used  . Alcohol Use: 1.2 oz/week    2 Shots of liquor per week     Comment: 7-10 drinks  . Drug Use: No  . Sexual Activity: Yes    Birth  Control/ Protection: None   Other Topics Concern  . Not on file   Social History Narrative   He currently works and Freight forwarder.       Patient lives at home with his wife [name] in a one-story home.  Patient has a college education. B.S. Patient is self employed.  They have two grown daughters.   Right handed.   Caffeine- one cup   No Known Allergies  Review of Systems  Musculoskeletal: Positive for arthralgias.  Neurological: Positive for numbness.     Objective:  Physical Exam  Constitutional: He is oriented to person, place, and time. He appears well-developed and well-nourished.  HENT:  Head: Normocephalic and atraumatic.  Eyes: EOM are normal.  Neck: Normal range of motion.  Cardiovascular: Normal rate.   Pulmonary/Chest: Effort normal.  Musculoskeletal: Normal range of motion. He exhibits tenderness.  Mild tenderness at the left SI joint and posterior to the left hip with limited internal and external rotation.  Neurological: He is alert and oriented to person, place, and time.  Skin: Skin is warm and dry.  Psychiatric: He has a normal mood and affect. His behavior is normal.  Nursing note and vitals reviewed.  [facility] (PRIMARY) x-ray report read by Dr. Everlene Farrier: Patient has multilevel degenerative disc disease. This is most severe at L2-L3 with about 4 mm of retrolisthesis of L2 at L5-S1 there is significant degenerative disc disease with spondylolisthesis grade 1 in this area. Hip x-ray showed mild degenerative changes no acute changes seen.   BP 128/82 mmHg  Pulse 82  Temp(Src) 98.7 F (37.1 C) (Oral)  Resp 18  Ht 5' 8.5" (1.74 m)  Wt 202 lb (91.627 kg)  BMI 30.26 kg/m2  SpO2 97% Assessment & Plan:  I suspect the patient's hip pain is coming from his back. He has 2 levels of significant degenerative disc disease with evidence of spondylolisthesis. Will treat with prednisone taper hydrocodone for pain and schedule an MRI of the lumbar spine.I personally  performed the services described in this documentation, which was scribed in my presence. The recorded information has been reviewed and is accurate.

## 2014-09-11 ENCOUNTER — Telehealth: Payer: Self-pay

## 2014-09-11 DIAGNOSIS — R110 Nausea: Secondary | ICD-10-CM

## 2014-09-11 NOTE — Telephone Encounter (Signed)
Pt states that theHYDROcodone-acetaminophen ([place]ie) 5-325 MG per tablet [93570] he was given is making him sick, and he would like to know if he can be called in something for nausea. Please advise

## 2014-09-12 MED ORDER — ONDANSETRON 4 MG PO TBDP: 4.0000 mg | ORAL_TABLET | Freq: Three times a day (TID) | ORAL | Status: DC | PRN

## 2014-09-12 NOTE — Telephone Encounter (Signed)
Pt.notified

## 2014-09-12 NOTE — Telephone Encounter (Signed)
Prescribed zofran for nausea. If he continues to have nausea with hydrocodone, may need to stop taking and find something he can tolerate better.

## 2014-09-12 NOTE — Telephone Encounter (Signed)
Please advise on something for nausea.

## 2014-09-19 ENCOUNTER — Ambulatory Visit
Admission: RE | Admit: 2014-09-19 | Discharge: 2014-09-19 | Disposition: A | Discharge status: Home / Self Care | Payer: 59 | Source: Ambulatory Visit | Attending: Emergency Medicine | Admitting: Emergency Medicine

## 2014-09-19 ENCOUNTER — Telehealth: Payer: Self-pay

## 2014-09-19 ENCOUNTER — Other Ambulatory Visit: Payer: Self-pay | Admitting: Emergency Medicine

## 2014-09-19 DIAGNOSIS — M5442 Lumbago with sciatica, left side: Secondary | ICD-10-CM

## 2014-09-19 MED ORDER — HYDROCODONE-ACETAMINOPHEN 5-325 MG PO TABS: 1.0000 | ORAL_TABLET | Freq: Four times a day (QID) | ORAL | Status: DC | PRN

## 2014-09-19 NOTE — Telephone Encounter (Signed)
lmom that rx is ready.

## 2014-09-19 NOTE — Telephone Encounter (Signed)
A doctor from his health insurance is going to call Dr. Everlene Farrier between 2-3 pm today.

## 2014-09-19 NOTE — Telephone Encounter (Signed)
Patient is leaving town [employer]ural [employer].  He has an MRI this afternoon.   He is out of HYDROcodone-acetaminophen (NORCO) 5-325 MG per tablet   [id number] (H)

## 2014-09-22 ENCOUNTER — Telehealth: Payer: Self-pay | Admitting: Emergency Medicine

## 2014-09-22 DIAGNOSIS — M545 Low back pain: Secondary | ICD-10-CM

## 2014-09-22 NOTE — Telephone Encounter (Signed)
Patient states that he missed a call from Dr. Everlene Farrier about some results. Please call [phone]

## 2014-09-22 NOTE — Telephone Encounter (Signed)
-----   Message from Constance Goltz, [place] sent at 09/20/2014  8:20 AM EDT -----   ----- Message -----    From: Darlyne Russian, MD    Sent: 09/20/2014   7:59 AM      To: [facility] Clinical Message Pool  There is mild slippage of L5 on S1 with some neural foraminal stenosis. My suggestion would be to follow-up with his neurosurgeon. Please confirm who he sees and go ahead and put in that referral. I suspect they will want to proceed with epidurals.

## 2014-09-22 NOTE — Telephone Encounter (Signed)
He missed his call again. Please advise at [phone]

## 2014-09-23 NOTE — Telephone Encounter (Signed)
No answer again 

## 2014-09-23 NOTE — Telephone Encounter (Signed)
Call patient again with the previous message regarding findings on his MRI. I have put in a referral to neurosurgery.

## 2014-09-27 ENCOUNTER — Other Ambulatory Visit: Payer: Self-pay | Admitting: Emergency Medicine

## 2014-09-27 MED ORDER — HYDROCODONE-ACETAMINOPHEN 5-325 MG PO TABS: 1.0000 | ORAL_TABLET | Freq: Four times a day (QID) | ORAL | Status: DC | PRN

## 2014-09-27 NOTE — Telephone Encounter (Signed)
Refill on hydrocodone. Patient is going out of town for 1 week for daughter's wedding. Patient also has appointment with Dr. Park Pope on the 24th  [phone]

## 2014-09-27 NOTE — Telephone Encounter (Signed)
Call Mr. Herbster he can pick up prescription at 104  tomorrow.

## 2014-09-27 NOTE — Telephone Encounter (Signed)
Pt notified that rx is ready for pickup at 104

## 2014-09-27 NOTE — Telephone Encounter (Signed)
Call patient he can pick up prescription at 104 tomorrow

## 2014-11-14 ENCOUNTER — Other Ambulatory Visit: Payer: Self-pay | Admitting: Emergency Medicine

## 2014-11-14 ENCOUNTER — Other Ambulatory Visit: Payer: Self-pay

## 2014-12-04 ENCOUNTER — Other Ambulatory Visit: Payer: Self-pay | Admitting: Emergency Medicine

## 2014-12-06 ENCOUNTER — Other Ambulatory Visit: Payer: Self-pay | Admitting: Emergency Medicine

## 2014-12-12 ENCOUNTER — Ambulatory Visit (INDEPENDENT_AMBULATORY_CARE_PROVIDER_SITE_OTHER): Payer: 59 | Admitting: Emergency Medicine

## 2014-12-12 VITALS — BP 140/80 | HR 84 | Temp 99.0°F | Resp 16 | Ht 68.5 in | Wt 186.0 lb

## 2014-12-12 DIAGNOSIS — G4700 Insomnia, unspecified: Secondary | ICD-10-CM | POA: Diagnosis not present

## 2014-12-12 DIAGNOSIS — R059 Cough, unspecified: Secondary | ICD-10-CM

## 2014-12-12 DIAGNOSIS — M545 Low back pain: Secondary | ICD-10-CM | POA: Diagnosis not present

## 2014-12-12 DIAGNOSIS — R05 Cough: Secondary | ICD-10-CM | POA: Diagnosis not present

## 2014-12-12 MED ORDER — METHOCARBAMOL 500 MG PO TABS: ORAL_TABLET | ORAL | Status: DC

## 2014-12-12 MED ORDER — ZOLPIDEM TARTRATE 5 MG PO TABS: ORAL_TABLET | ORAL | Status: DC

## 2014-12-12 NOTE — Progress Notes (Signed)
Patient ID: Gabriel Richardson, male   DOB: [birth year], 69 y.o.   MRN: [id number]     This chart was scribed for Gabriel Queen, MD by Zola Button, Medical Scribe. This patient was seen in room 3 and the patient's care was started at 11:49 AM.   Chief Complaint:  Chief Complaint  Patient presents with  . Medication Refill    Robaxin 500 mg./ Ambien    HPI: Gabriel Richardson is a 69 y.o. male with a history of cervical who reports to [facility] today for a medication refill for Robaxin and Ambien. Patient has been doing well overall. He has back pain primarily in his lower left back with some associated radiation down his legs and numbness. He has been receiving epidural injections but will likely require surgery. He has days when he does not need to take the Robaxin, but he has days when he takes it up to 3 times.  Patient is having a divorce and will be moving next week. He has not been happy in the marriage for the past 10 years so he thinks this will be a good change for him. He has been feeling happy.  Past Medical History  Diagnosis Date  . Depression   . Anxiety   . COPD (chronic obstructive pulmonary disease)   . Cervical stenosis of spinal canal   . Asthma   . Emphysema of lung   . Neuromuscular disorder    Past Surgical History  Procedure Laterality Date  . Spine surgery      Cervical C3-C5 anterior fusion  . Lumbar laminectomy    . Cervical fusion     History   Social History  . Marital Status: Married    Spouse Name: [name]  . Number of Children: 2  . Years of Education: BS   Occupational History  .      Works for himself   Social History Main Topics  . Smoking status: Former Smoker -- 1.50 packs/day for 25 years    Types: Cigarettes    Quit date: 05/20/1997  . Smokeless tobacco: Never Used  . Alcohol Use: 1.2 oz/week    2 Shots of liquor per week     Comment: 7-10 drinks  . Drug Use: No  . Sexual Activity: Yes    Birth Control/ Protection: None   Other Topics  Concern  . None   Social History Narrative   He currently works and Freight forwarder.       Patient lives at home with his wife [name] in a one-story home.  Patient has a college education. B.S. Patient is self employed.  They have two grown daughters.   Right handed.   Caffeine- one cup   Family History  Problem Relation Age of Onset  . Kidney disease Mother   . Breast cancer Mother     Living, 69  . Diabetes Mellitus II Father     Deceased, 56  . Tremor Father   . Depression Daughter   . Healthy Brother    No Known Allergies Prior to Admission medications   Medication Sig Start Date End Date Taking? Authorizing Provider  ALPRAZolam [name]) 0.5 MG tablet Take 0.5 mg by mouth at bedtime as needed for sleep.   Yes Historical Provider, MD  ibuprofen (ADVIL,MOTRIN) 200 MG tablet Take 400 mg by mouth every 6 (six) hours as needed for moderate pain.   Yes Historical Provider, MD  lamoTRIgine (LAMICTAL) 200 MG tablet Take 200 mg by mouth  at bedtime.    Yes Historical Provider, MD  methocarbamol (ROBAXIN) 500 MG tablet TAKE 1 TABLET BY MOUTH EVERY 8 HOURS (NEED OFFICE VISIT FOR ADDITIONAL REFILLS) 12/08/14  Yes Darlyne Russian, MD  omeprazole (PRILOSEC) 20 MG capsule Take 20 mg by mouth daily.   Yes Historical Provider, MD  ondansetron (ZOFRAN ODT) 4 MG disintegrating tablet Take 1 tablet (4 mg total) by mouth every 8 (eight) hours as needed for nausea or vomiting. 09/12/14  Yes Bennett Scrape V, PA-C  pregabalin (LYRICA) 75 MG capsule Take 1 capsule (75 mg total) by mouth 2 (two) times daily. 11/16/13  Yes Thao P Le, DO  propranolol ER (INDERAL LA) 60 MG 24 hr capsule Take 1 capsule (60 mg total) by mouth daily. 03/07/14  Yes Donika K Patel, DO  zolpidem (AMBIEN) 5 MG tablet Take 10 mg by mouth at bedtime as needed for sleep.   Yes Historical Provider, MD     ROS: The patient denies fevers, chills, night sweats, unintentional weight loss, chest pain, palpitations, wheezing, dyspnea on  exertion, nausea, vomiting, abdominal pain, dysuria, hematuria, melena, numbness, weakness, or tingling.   All other systems have been reviewed and were otherwise negative with the exception of those mentioned in the HPI and as above.    PHYSICAL EXAM: Filed Vitals:   12/12/14 1106  BP: 140/80  Pulse: 84  Temp: 99 F (37.2 C)  Resp: 16   Body mass index is 27.87 kg/(m^2).   General: Alert, no acute distress HEENT:  Normocephalic, atraumatic, oropharynx patent. Eye: [name] [facility] Cardiovascular:  Regular rate and rhythm, no rubs murmurs or gallops.  No Carotid bruits, radial pulse intact. No pedal edema.  Respiratory: Clear to auscultation bilaterally.  No wheezes, rales, or rhonchi.  No cyanosis, no use of accessory musculature Abdominal: No organomegaly, abdomen is soft and non-tender, positive bowel sounds.  No masses. Musculoskeletal: Gait intact. No edema Skin: No rashes. Neurologic: Facial musculature symmetric. Psychiatric: Patient acts appropriately throughout our interaction. Lymphatic: No cervical or submandibular lymphadenopathy Genitourinary/Anorectal: No acute findings    LABS:    EKG/XRAY:   Primary read interpreted by Dr. Everlene Farrier at [facility].   ASSESSMENT/PLAN: Refills given for Robaxin and Ambien. He is doing clinically well with his respiratory status. He is having a follow-up visit with Dr. Joya Salm  regarding his back and is contemplating surgery in the future.  Gross sideeffects, risk and benefits, and alternatives of medications d/w patient. Patient is aware that all medications have potential sideeffects and we are unable to predict every sideeffect or drug-drug interaction that may occur.  Gabriel Queen MD 12/12/2014 11:58 AM

## 2014-12-16 ENCOUNTER — Telehealth: Payer: Self-pay

## 2014-12-16 NOTE — Telephone Encounter (Signed)
Dr Everlene Farrier, pharm faxed notice that pt has been taking 10 mg zolpidem Qhs, not 5 mg. So you want to change Rx to 10 mg? I didn't see anything in notes about you wanting to decrease dose, but doesn't look like we've Rxd for pt before?

## 2014-12-17 NOTE — Telephone Encounter (Signed)
The dosage over age 67 is 5 mg.

## 2014-12-20 NOTE — Telephone Encounter (Signed)
Called and LM on MD VM for pharm advising that Dr Everlene Farrier wants pt only to take the 5 mg Qhs d/t age and max recommended dose.

## 2015-01-05 ENCOUNTER — Encounter: Payer: Self-pay | Admitting: Emergency Medicine

## 2015-01-05 ENCOUNTER — Ambulatory Visit (INDEPENDENT_AMBULATORY_CARE_PROVIDER_SITE_OTHER): Payer: 59 | Admitting: Emergency Medicine

## 2015-01-05 VITALS — BP 144/80 | HR 114 | Temp 99.0°F | Ht 67.5 in | Wt 178.0 lb

## 2015-01-05 DIAGNOSIS — R9431 Abnormal electrocardiogram [ECG] [EKG]: Secondary | ICD-10-CM

## 2015-01-05 DIAGNOSIS — Z125 Encounter for screening for malignant neoplasm of prostate: Secondary | ICD-10-CM

## 2015-01-05 DIAGNOSIS — F419 Anxiety disorder, unspecified: Secondary | ICD-10-CM | POA: Diagnosis not present

## 2015-01-05 DIAGNOSIS — R000 Tachycardia, unspecified: Secondary | ICD-10-CM

## 2015-01-05 DIAGNOSIS — R634 Abnormal weight loss: Secondary | ICD-10-CM | POA: Diagnosis not present

## 2015-01-05 LAB — POCT CBC
Granulocyte percent: 48.3 %G (ref 37–80)
HEMATOCRIT: 43.1 % — AB (ref 43.5–53.7)
Hemoglobin: 14.1 g/dL (ref 14.1–18.1)
Lymph, poc: 2 (ref 0.6–3.4)
MCH: 32.7 pg — AB (ref 27–31.2)
MCHC: 32.6 g/dL (ref 31.8–35.4)
MCV: 100.3 fL — AB (ref 80–97)
MID (cbc): 0.4 (ref 0–0.9)
MPV: 7.7 fL (ref 0–99.8)
POC GRANULOCYTE: 2.3 (ref 2–6.9)
POC LYMPH PERCENT: 42.9 %L (ref 10–50)
POC MID %: 8.8 % (ref 0–12)
Platelet Count, POC: 198 10*3/uL (ref 142–424)
RBC: 4.3 M/uL — AB (ref 4.69–6.13)
RDW, POC: 14.9 %
WBC: 4.7 10*3/uL (ref 4.6–10.2)

## 2015-01-05 LAB — THYROID PANEL WITH TSH
Free Thyroxine Index: 2.1 (ref 1.4–3.8)
T3 Uptake: 29 % (ref 22–35)
T4, Total: 7.2 ug/dL (ref 4.5–12.0)
TSH: 0.738 u[IU]/mL (ref 0.350–4.500)

## 2015-01-05 LAB — COMPLETE METABOLIC PANEL WITH GFR
ALT: 47 U/L — ABNORMAL HIGH (ref 9–46)
AST: 64 U/L — AB (ref 10–35)
Albumin: 3.9 g/dL (ref 3.6–5.1)
Alkaline Phosphatase: 56 U/L (ref 40–115)
BUN: 6 mg/dL — AB (ref 7–25)
CALCIUM: 8.9 mg/dL (ref 8.6–10.3)
CO2: 27 mmol/L (ref 20–31)
Chloride: 102 mmol/L (ref 98–110)
Creat: 0.66 mg/dL — ABNORMAL LOW (ref 0.70–1.25)
GFR, Est Non African American: >89 mL/min (ref >=60)
Glucose, Bld: 81 mg/dL (ref 65–99)
POTASSIUM: 4 mmol/L (ref 3.5–5.3)
Sodium: 140 mmol/L (ref 135–146)
Total Bilirubin: 0.9 mg/dL (ref 0.2–1.2)
Total Protein: 6.8 g/dL (ref 6.1–8.1)

## 2015-01-05 LAB — POCT SEDIMENTATION RATE: POCT SED RATE: 19 mm/hr (ref 0–22)

## 2015-01-05 NOTE — Progress Notes (Addendum)
Subjective:  This chart was scribed for [name] MD,  by Tamsen Roers, at [facility].  This patient was seen in room 12 and the patient's care was started at 8:33 AM.    Patient ID: Baxter Hire, male    DOB: April 03, 1946, 69 y.o.   MRN: [id number] Chief Complaint  Patient presents with  . Erectile Dysfunction  . Stress    worse over last1-3 months  . Weight Loss    concerned    HPI  HPI Comments: THAILAN SAVA is a 69 y.o. male who presents to the [facility] complaining of weight loss, stress and erectile dysfunction.   Weight loss: States that it started 6 months ago, went from a 42 inch waist to a 38 inch waist.  He states that he does not snack any longer and only eats 1 meal a day.  Patient states that all of this has started after he decided to finalize his divorce on July 26th.  Patient decided to make this decision after meeting his new girl friend Di Kindle ([place]) who he met at a [employer] as a Programme researcher, broadcasting/film/video.  She is no longer working at [employer] and is now living with him.   Patient states that he is having difficulty as his children are lashing out at him for the decision that he has made to leave his wife.  Patient states that he has now given up trying to contact his children and has taken their pictures off his walls at his home.  He has two grandchildren who are no longer allowed to see him.    Erectile Dysfunction/ Divorce: He recently spent 2 [place] at the beach and at the [place] with his girlfriend and they are learning a lot of new things about one another. He states that the good things out weight the bad things.  He states that a lot of his thoughts and his stress is coming from the situation with his family and dealing with the legal aspects of the divorce.  He feels that his erectile dysfunction is due to all of these things he now has to deal with.  He states that his girlfriend likes to have a lot of  intercourse but he is having difficulty keeping up with her as he has a lot is on his mind.  The stress is also keeping him from focusing at work.  Patient denies any thoughts of suicide.    Patient sees his psychiatrist once every 6 months and states that he helps him significantly with his highs and lows. He is willing to go see his psychiatrist soon to discuss everything that has been going on in his life.   Patient has a history of alcoholism.    Patient Active Problem List   Diagnosis Date Noted  . Ptosis 12/14/2012  . Tremor 12/14/2012  . Depression 11/02/2012  . Asthma, chronic 11/02/2012  . HYPERTENSION, BENIGN 01/15/2009  . ABNORMAL ELECTROCARDIOGRAM 01/12/2009   Past Medical History  Diagnosis Date  . Depression   . Anxiety   . COPD (chronic obstructive pulmonary disease)   . Cervical stenosis of spinal canal   . Asthma   . Emphysema of lung   . Neuromuscular disorder    Past Surgical History  Procedure Laterality Date  . Spine surgery      Cervical C3-C5 anterior fusion  . Lumbar laminectomy    . Cervical fusion     No Known  Allergies Prior to Admission medications   Medication Sig Start Date End Date Taking? Authorizing Provider  ALPRAZolam [name]) 0.5 MG tablet Take 0.5 mg by mouth at bedtime as needed for sleep.   Yes Historical Provider, MD  ibuprofen (ADVIL,MOTRIN) 200 MG tablet Take 400 mg by mouth every 6 (six) hours as needed for moderate pain.   Yes Historical Provider, MD  lamoTRIgine (LAMICTAL) 200 MG tablet Take 200 mg by mouth at bedtime.    Yes Historical Provider, MD  methocarbamol (ROBAXIN) 500 MG tablet TAKE 1 TABLET BY MOUTH EVERY 8 HOURS 12/12/14  Yes Darlyne Russian, MD  propranolol ER (INDERAL LA) 60 MG 24 hr capsule Take 1 capsule (60 mg total) by mouth daily. 03/07/14  Yes Alda Berthold, DO  zolpidem (AMBIEN) 5 MG tablet Take 1 tablet at bedtime 12/12/14  Yes Darlyne Russian, MD   Social History   Social History  . Marital Status: Married     Spouse Name: [name]  . Number of Children: 2  . Years of Education: BS   Occupational History  .      Works for himself   Social History Main Topics  . Smoking status: Former Smoker -- 1.50 packs/day for 25 years    Types: Cigarettes    Quit date: 05/20/1997  . Smokeless tobacco: Never Used  . Alcohol Use: 1.2 oz/week    2 Shots of liquor per week     Comment: 7-10 drinks  . Drug Use: No  . Sexual Activity: Yes    Birth Control/ Protection: None   Other Topics Concern  . Not on file   Social History Narrative   He currently works and Freight forwarder.       Patient lives at home with his wife [name] in a one-story home.  Patient has a college education. B.S. Patient is self employed.  They have two grown daughters.   Right handed.   Caffeine- one cup        Review of Systems  Constitutional: Positive for unexpected weight change. Negative for fever and chills.  Eyes: Negative for pain, discharge, redness and itching.  Respiratory: Negative for cough and shortness of breath.   Gastrointestinal: Negative for nausea and vomiting.  Musculoskeletal: Negative for neck pain and neck stiffness.       Objective:   Physical Exam  CONSTITUTIONAL: He is anxious, in no distress.   HEAD: Normocephalic/atraumatic EYES: EOMI/PERRL ENMT: Mucous membranes moist NECK: supple no meningeal signs SPINE/BACK:entire spine nontender CV: S1/S2 noted, no murmurs/rubs/gallops noted LUNGS: Lungs are clear to auscultation bilaterally, no apparent distress NEURO: Pt is awake/alert/appropriate, moves all extremitiesx4.  No facial droop.   EXTREMITIES: pulses normal/equal, full ROM SKIN: warm, color normal  Ekg normal sinus rhythm Q waves 3 aVF which are new.   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 He is spending large amounts of money has left his wife staying with a young lady from [place]. He plans to buy her ring this week. I called Dr. Casimiro Needle  his psychiatrist and he is going to work him in quickly. I'm also concerned about his EKG which shows new Q waves in 3 and aVF.  He was requesting a refill on his Viagra which I declined. He needs cardiac evaluation and psychiatric help and then we will readdress the need for ED drugs.I personally performed the services described in this documentation, which was scribed in my presence. The recorded information has been reviewed and is accurate.  Nena Jordan, MD

## 2015-01-06 LAB — PSA, MEDICARE: PSA: 0.67 ng/mL (ref <=4.00)

## 2015-01-25 ENCOUNTER — Other Ambulatory Visit: Payer: Self-pay | Admitting: Neurology

## 2015-01-25 NOTE — Telephone Encounter (Signed)
Rx refused

## 2015-02-09 ENCOUNTER — Encounter: Payer: 59 | Admitting: Cardiovascular Disease

## 2015-02-09 NOTE — Progress Notes (Signed)
This encounter was created in error - please disregard.

## 2015-02-17 ENCOUNTER — Ambulatory Visit: Payer: 59 | Admitting: Cardiovascular Disease

## 2015-02-21 ENCOUNTER — Encounter: Payer: Self-pay | Admitting: Emergency Medicine

## 2015-03-02 NOTE — Progress Notes (Signed)
Cardiology Office Note   Date:  03/03/2015   ID:  Gabriel Richardson, DOB Feb 18, 1946, MRN [id number]  PCP:  Jenny Reichmann, MD  Cardiologist:   Sharol Harness, MD   Chief Complaint  Patient presents with  . New Evaluation    abnormal ekg,  no chest pain , no short of breath , no swelling       History of Present Illness: Gabriel Richardson is a 69 y.o. male with hypertension, asthma, and biplar depression who presents for an evaluation of abnormal EKG.  Mr. Breaker saw Dr. Everlene Farrier in August and was noted to have inferior Q waves.  At the time he denied any chest pain but was requesting a refill of his Viagra. Dr. Everlene Farrier was concerned about refilling this without a cardiac assessment. Therefore he was referred to cardiology for evaluation. At the time Dr. Everlene Farrier was also concerned that he was in a manic phase.  He reports that his manic episodes occur approximately once every 5 years and last for several months at a time.  When he is manic he typically wears a laboratory wears when he has not manic he wears no jewelry at all. He has been coming out of his last episode for the last 5-6 [place].  He has been following up regularly with his psychiatrist and feels that his mood is much more stable.  He recently became engaged to a 68 year old woman from [place] after being married for 38 years.  He has been feeling very stressed lately as he is going through a divorce. He is now living in an apartment after having lived in a large home for many years. He is also dealing with the generation gap in living with his girlfriend. However he is much happier in this relationship to any has been in years.  Mr. Cruickshank does not get any formal exercise but states that he walks all day. He is not able to sit still for long periods of time. He is able to walk long distances and up stairs without any chest pains or shortness of breath. 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  He does not have any chest  pain with sexual intercourse.    Mr. Schriefer has no family history of cardiac disease.  He quit smoking 20 years ago.   Past Medical History  Diagnosis Date  . Depression   . Anxiety   . COPD (chronic obstructive pulmonary disease) ([place])   . Cervical stenosis of spinal canal   . Asthma   . Emphysema of lung ([place])   . Neuromuscular disorder [facility])     Past Surgical History  Procedure Laterality Date  . Spine surgery      Cervical C3-C5 anterior fusion  . Lumbar laminectomy    . Cervical fusion       Current Outpatient Prescriptions  Medication Sig Dispense Refill  . ALPRAZolam (XANAX) 0.5 MG tablet Take 0.5 mg by mouth daily as needed.  5  . lamoTRIgine (LAMICTAL) 200 MG tablet Take 200 mg by mouth at bedtime.  1  . methocarbamol (ROBAXIN) 500 MG tablet Take 500 mg by mouth every 8 (eight) hours.    . propranolol ER (INDERAL LA) 60 MG 24 hr capsule Take 60 mg by mouth daily.    . traMADol (ULTRAM) 50 MG tablet Take 50 mg by mouth 2 (two) times daily as needed.    Marland Kitchen VIAGRA 100 MG tablet Take  100 mg by mouth as needed.  3  . zolpidem (AMBIEN) 5 MG tablet Take 5 mg by mouth at bedtime.  2  . lisinopril (PRINIVIL,ZESTRIL) 20 MG tablet Take 1 tablet (20 mg total) by mouth daily. 90 tablet 3   No current facility-administered medications for this visit.    Allergies:   Review of patient's allergies indicates no known allergies.    Social History:  The patient  reports that he quit smoking about 17 years ago. His smoking use included Cigarettes. He has a 37.5 pack-year smoking history. He has never used smokeless tobacco. He reports that he drinks about 1.2 oz of alcohol per week. He reports that he does not use illicit drugs.   Family History:  The patient's family history includes Breast cancer in his mother; Depression in his daughter; Diabetes Mellitus II in his father; Healthy in his brother; Kidney disease in his mother; Tremor in his father.    ROS:  Please see the history  of present illness.   Otherwise, review of systems are positive for none.   All other systems are reviewed and negative.    PHYSICAL EXAM: VS:  BP 154/100 mmHg  Pulse 80  Ht 5\' 10"  (1.778 m)  Wt 81.511 kg (179 lb 11.2 oz)  BMI 25.78 kg/m2 , BMI Body mass index is 25.78 kg/(m^2). GENERAL:  Well appearing HEENT:  Pupils equal round and reactive, fundi not visualized, oral mucosa unremarkable NECK:  No jugular venous distention, waveform within normal limits, carotid upstroke brisk and symmetric, no bruits, no thyromegaly LYMPHATICS:  No cervical adenopathy LUNGS:  Clear to auscultation bilaterally HEART:  RRR.  PMI not displaced or sustained,S1 and S2 within normal limits, no S3, no S4, no clicks, no rubs, no murmurs ABD:  Flat, positive bowel sounds normal in frequency in pitch, no bruits, no rebound, no guarding, no midline pulsatile mass, no hepatomegaly, no splenomegaly EXT:  2 plus pulses throughout, no edema, no cyanosis no clubbing SKIN:  No rashes no nodules NEURO:  Cranial nerves II through XII grossly intact, motor grossly intact throughout PSYCH:  Cognitively intact, oriented to person place and time    EKG:  EKG is ordered today. The ekg ordered today demonstrates sinus rhythm at 78 bmp.  LVH.   Recent Labs: 01/05/2015: ALT 47*; BUN 6*; Creat 0.66*; Hemoglobin 14.1; Potassium 4.0; Sodium 140; TSH 0.738    Lipid Panel    Component Value Date/Time   CHOL 146 08/13/2013 0905   TRIG 103 08/13/2013 0905   HDL 50 08/13/2013 0905   CHOLHDL 2.9 08/13/2013 0905   VLDL 21 08/13/2013 0905   LDLCALC 75 08/13/2013 0905      Wt Readings from Last 3 Encounters:  03/03/15 81.511 kg (179 lb 11.2 oz)  01/05/15 80.74 kg (178 lb)  12/12/14 84.369 kg (186 lb)      ASSESSMENT AND PLAN:  # EKG changes: Mr. Sagan has very small q waves inferiorly today that are not as dramatic as the ones when he was seen in clinic in August. He does not have any chest pain or shortness of breath  with exertion. He is also not diabetic, which would make me think he may have had a silent heart attack. Given that he also does not have any heart failure or ischemia symptoms, I do not think we need any further ischemia workup at this time.  I do not see any reason, from a cardiac standpoint, that he cannot safely use Viagra.  #  Hypertension: BP poorly-controlled.  His only antihypertensive is propranolol. We'll start lisinopril 20 mg by mouth daily. He plans to follow-up with Dr.Daub on Sunday. At that point medication can be titrated and his electrolytes be checked to make sure his creatinine and potassium are stable.   Current medicines are reviewed at length with the patient today.  The patient does not have concerns regarding medicines.  The following changes have been made:  Start lisinopril 20 mg daily.  Labs/ tests ordered today include:   Orders Placed This Encounter  Procedures  . EKG 12-Lead     Disposition:   FU with Corleen Otwell C. Oval Linsey, MD prn   Signed, Sharol Harness, MD  03/03/2015 12:22 PM    [facility]

## 2015-03-03 ENCOUNTER — Ambulatory Visit (INDEPENDENT_AMBULATORY_CARE_PROVIDER_SITE_OTHER): Payer: 59 | Admitting: Cardiovascular Disease

## 2015-03-03 ENCOUNTER — Encounter: Payer: Self-pay | Admitting: Cardiovascular Disease

## 2015-03-03 VITALS — BP 154/100 | HR 80 | Ht 70.0 in | Wt 179.7 lb

## 2015-03-03 DIAGNOSIS — I10 Essential (primary) hypertension: Secondary | ICD-10-CM

## 2015-03-03 DIAGNOSIS — R9431 Abnormal electrocardiogram [ECG] [EKG]: Secondary | ICD-10-CM

## 2015-03-03 MED ORDER — LISINOPRIL 20 MG PO TABS: 20.0000 mg | ORAL_TABLET | Freq: Every day | ORAL | Status: DC

## 2015-03-03 NOTE — Patient Instructions (Signed)
START LISINOPRIL 20 MG ONE TABLET DAILY.   NO OTHER CHANGES   Your physician recommends that you schedule a follow-up appointment AS NEEDED.

## 2015-03-08 ENCOUNTER — Ambulatory Visit (INDEPENDENT_AMBULATORY_CARE_PROVIDER_SITE_OTHER): Payer: 59 | Admitting: Emergency Medicine

## 2015-03-08 VITALS — BP 138/88 | HR 84 | Temp 98.6°F | Resp 17 | Ht 69.0 in | Wt 178.0 lb

## 2015-03-08 DIAGNOSIS — M545 Low back pain: Secondary | ICD-10-CM

## 2015-03-08 DIAGNOSIS — I10 Essential (primary) hypertension: Secondary | ICD-10-CM

## 2015-03-08 DIAGNOSIS — Z23 Encounter for immunization: Secondary | ICD-10-CM

## 2015-03-08 DIAGNOSIS — N528 Other male erectile dysfunction: Secondary | ICD-10-CM | POA: Diagnosis not present

## 2015-03-08 LAB — CBC WITH DIFFERENTIAL/PLATELET
BASOS PCT: 1 % (ref 0–1)
Basophils Absolute: 0.1 10*3/uL (ref 0.0–0.1)
EOS PCT: 3 % (ref 0–5)
Eosinophils Absolute: 0.2 10*3/uL (ref 0.0–0.7)
HCT: 41.1 % (ref 39.0–52.0)
Hemoglobin: 14.9 g/dL (ref 13.0–17.0)
Lymphocytes Relative: 39 % (ref 12–46)
Lymphs Abs: 2.3 10*3/uL (ref 0.7–4.0)
MCH: 33.7 pg (ref 26.0–34.0)
MCHC: 36.3 g/dL — AB (ref 30.0–36.0)
MCV: 93 fL (ref 78.0–100.0)
MONO ABS: 0.9 10*3/uL (ref 0.1–1.0)
MPV: 11.1 fL (ref 8.6–12.4)
Monocytes Relative: 15 % — ABNORMAL HIGH (ref 3–12)
NEUTROS ABS: 2.5 10*3/uL (ref 1.7–7.7)
Neutrophils Relative %: 42 % — ABNORMAL LOW (ref 43–77)
Platelets: 170 10*3/uL (ref 150–400)
RBC: 4.42 MIL/uL (ref 4.22–5.81)
RDW: 13.2 % (ref 11.5–15.5)
WBC: 6 10*3/uL (ref 4.0–10.5)

## 2015-03-08 LAB — BASIC METABOLIC PANEL WITH GFR
BUN: 9 mg/dL (ref 7–25)
CALCIUM: 9.1 mg/dL (ref 8.6–10.3)
CO2: 28 mmol/L (ref 20–31)
Chloride: 102 mmol/L (ref 98–110)
Creat: 0.69 mg/dL — ABNORMAL LOW (ref 0.70–1.25)
Glucose, Bld: 95 mg/dL (ref 65–99)
Potassium: 4.8 mmol/L (ref 3.5–5.3)
SODIUM: 138 mmol/L (ref 135–146)

## 2015-03-08 MED ORDER — SILDENAFIL CITRATE 100 MG PO TABS: 100.0000 mg | ORAL_TABLET | Freq: Every day | ORAL | Status: DC | PRN

## 2015-03-08 MED ORDER — METHOCARBAMOL 500 MG PO TABS: ORAL_TABLET | ORAL | Status: DC

## 2015-03-08 NOTE — Progress Notes (Addendum)
This chart was scribed for [name], MD by Moises Blood, Medical Scribe. This patient was seen in Room 3 and the patient's care was started 8:27 AM.  Chief Complaint:  Chief Complaint  Patient presents with  . Medication Refill    robaxin,viagra  . Immunizations    flu shot    HPI: Gabriel Richardson is a 69 y.o. male who reports to [facility] today for medication refill for robaxin and viagra.  He orders viagra 100 mg from [place] pharmacy, usually orders in a 12 pack. He saw his cardiologist and she cleared him on taking viagra.  He takes robaxin and tramadol twice a day. He usually takes them together.   He received a flu shot today.   Cardiologist - [name], requested a BMP done   Past Medical History  Diagnosis Date  . Depression   . Anxiety   . COPD (chronic obstructive pulmonary disease) ([place])   . Cervical stenosis of spinal canal   . Asthma   . Emphysema of lung ([place])   . Neuromuscular disorder [facility])    Past Surgical History  Procedure Laterality Date  . Spine surgery      Cervical C3-C5 anterior fusion  . Lumbar laminectomy    . Cervical fusion     Social History   Social History  . Marital Status: Married    Spouse Name: [name]  . Number of Children: 2  . Years of Education: BS   Occupational History  .      Works for himself   Social History Main Topics  . Smoking status: Former Smoker -- 1.50 packs/day for 25 years    Types: Cigarettes    Quit date: 05/20/1997  . Smokeless tobacco: Never Used  . Alcohol Use: 1.2 oz/week    2 Shots of liquor per week     Comment: 7-10 drinks  . Drug Use: No  . Sexual Activity: Yes    Birth Control/ Protection: None   Other Topics Concern  . None   Social History Narrative   He currently works and Freight forwarder.       Patient lives at home with his wife [name] in a one-story home.  Patient has a college education. B.S. Patient is self employed.  They have two grown daughters.   Right  handed.   Caffeine- one cup   Family History  Problem Relation Age of Onset  . Kidney disease Mother   . Breast cancer Mother     Living, 25  . Diabetes Mellitus II Father     Deceased, 48  . Tremor Father   . Depression Daughter   . Healthy Brother    No Known Allergies Prior to Admission medications   Medication Sig Start Date End Date Taking? Authorizing Provider  ALPRAZolam [name]) 0.5 MG tablet Take 0.5 mg by mouth daily as needed. 01/18/15   Historical Provider, MD  lamoTRIgine (LAMICTAL) 200 MG tablet Take 200 mg by mouth at bedtime. 01/18/15   Historical Provider, MD  lisinopril (PRINIVIL,ZESTRIL) 20 MG tablet Take 1 tablet (20 mg total) by mouth daily. 03/03/15   [name], MD  methocarbamol (ROBAXIN) 500 MG tablet Take 500 mg by mouth every 8 (eight) hours. 12/08/14   Historical Provider, MD  propranolol ER (INDERAL LA) 60 MG 24 hr capsule Take 60 mg by mouth daily. 06/26/14   Historical Provider, MD  traMADol (ULTRAM) 50 MG tablet Take 50 mg by mouth 2 (two) times daily as  needed. 02/27/15   Historical Provider, MD  VIAGRA 100 MG tablet Take 100 mg by mouth as needed. 01/29/15   Historical Provider, MD  zolpidem (AMBIEN) 5 MG tablet Take 5 mg by mouth at bedtime. 12/12/14   Historical Provider, MD     ROS:  Constitutional: negative for chills, fever, night sweats, weight changes, or fatigue  HEENT: negative for vision changes, hearing loss, congestion, rhinorrhea, ST, epistaxis, or sinus pressure Cardiovascular: negative for chest pain or palpitations Respiratory: negative for hemoptysis, wheezing, shortness of breath, or cough Abdominal: negative for abdominal pain, nausea, vomiting, diarrhea, or constipation Dermatological: negative for rash Neurologic: negative for headache, dizziness, or syncope All other systems reviewed and are otherwise negative with the exception to those above and in the HPI.  PHYSICAL EXAM: Filed Vitals:   03/08/15 0807  BP: 138/88    Pulse: 84  Temp: 98.6 F (37 C)  Resp: 17   Body mass index is 26.27 kg/(m^2).   General: Alert, no acute distress HEENT:  Normocephalic, atraumatic, oropharynx patent. Eye: [name] [facility] Cardiovascular:  Regular rate and rhythm, no rubs murmurs or gallops.  No Carotid bruits, radial pulse intact. No pedal edema.  Respiratory: Clear to auscultation bilaterally.  No wheezes, rales, or rhonchi.  No cyanosis, no use of accessory musculature Abdominal: No organomegaly, abdomen is soft and non-tender, positive bowel sounds. No masses. Musculoskeletal: Gait intact. No edema, tenderness Skin: No rashes. Neurologic: Facial musculature symmetric. Psychiatric: Patient acts appropriately throughout our interaction.  Lymphatic: No cervical or submandibular lymphadenopathy Genitourinary/Anorectal: No acute findings   LABS:  Results for orders placed or performed in visit on 01/05/15  COMPLETE METABOLIC PANEL WITH GFR  Result Value Ref Range   Sodium 140 135 - 146 mmol/L   Potassium 4.0 3.5 - 5.3 mmol/L   Chloride 102 98 - 110 mmol/L   CO2 27 20 - 31 mmol/L   Glucose, Bld 81 65 - 99 mg/dL   BUN 6 (L) 7 - 25 mg/dL   Creat 0.66 (L) 0.70 - 1.25 mg/dL   Total Bilirubin 0.9 0.2 - 1.2 mg/dL   Alkaline Phosphatase 56 40 - 115 U/L   AST 64 (H) 10 - 35 U/L   ALT 47 (H) 9 - 46 U/L   Total Protein 6.8 6.1 - 8.1 g/dL   Albumin 3.9 3.6 - 5.1 g/dL   Calcium 8.9 8.6 - 10.3 mg/dL   GFR, Est African American >89 >=60 mL/min   GFR, Est Non African American >89 >=60 mL/min  PSA, Medicare  Result Value Ref Range   PSA 0.67 <=4.00 ng/mL  Thyroid Panel With TSH  Result Value Ref Range   T4, Total 7.2 4.5 - 12.0 ug/dL   T3 Uptake 29 22 - 35 %   Free Thyroxine Index 2.1 1.4 - 3.8   TSH 0.738 0.350 - 4.500 uIU/mL  POCT CBC  Result Value Ref Range   WBC 4.7 4.6 - 10.2 K/uL   Lymph, poc 2.0 0.6 - 3.4   POC LYMPH PERCENT 42.9 10 - 50 %L   MID (cbc) 0.4 0 - 0.9   POC MID % 8.8 0 - 12 %M   POC  Granulocyte 2.3 2 - 6.9   Granulocyte percent 48.3 37 - 80 %G   RBC 4.3 (A) 4.69 - 6.13 M/uL   Hemoglobin 14.1 14.1 - 18.1 g/dL   HCT, POC 43.1 (A) 43.5 - 53.7 %   MCV 100.3 (A) 80 - 97 fL   MCH, POC  32.7 (A) 27 - 31.2 pg   MCHC 32.6 31.8 - 35.4 g/dL   RDW, POC 14.9 %   Platelet Count, POC 198.0 142 - 424 K/uL   MPV 7.7 0 - 99.8 fL  POCT SEDIMENTATION RATE  Result Value Ref Range   POCT SED RATE 19 0 - 22 mm/hr    EKG/XRAY:   Primary read interpreted by Dr. [facility]lene Farrier at [facility].   ASSESSMENT/PLAN: Patient doing well. He has recently seen Dr. Oval Linsey. . I have drawn a basic metabolic panel today and will forward to his cardiologist. He was given a flu shot today. He was given Viagra 100 mg tablets #12 which she will fill in [place]. He will also be on Robaxin with Ultram to take for back discomfort. Will follow-up blood pressure in about a month.I personally performed the services described in this documentation, which was scribed in my presence. The recorded information has been reviewed and is accurate.  By signing my name below, I, Moises Blood, attest that this documentation has been prepared under the direction and in the presence of [name], MD. Electronically Signed: Moises Blood, [place]. 03/08/2015 , 8:27 AM .    Johney [place] sideeffects, risk and benefits, and alternatives of medications d/w patient. Patient is aware that all medications have potential sideeffects and we are unable to predict every sideeffect or drug-drug interaction that may occur.  [name] MD 03/08/2015 8:27 AM

## 2015-03-14 ENCOUNTER — Telehealth: Payer: Self-pay | Admitting: *Deleted

## 2015-03-14 ENCOUNTER — Telehealth: Payer: Self-pay | Admitting: Cardiovascular Disease

## 2015-03-14 NOTE — Telephone Encounter (Signed)
Returning your call. °

## 2015-03-14 NOTE — Telephone Encounter (Signed)
Spoke to patient. Result given . Verbalized understanding  

## 2015-03-14 NOTE — Telephone Encounter (Signed)
-----   Message from [name], MD sent at 03/13/2015  2:38 PM EDT ----- Normal labs.

## 2015-03-14 NOTE — Telephone Encounter (Signed)
Left detailed message on secures phone Release to my chart

## 2015-03-17 ENCOUNTER — Ambulatory Visit: Payer: 59 | Admitting: Internal Medicine

## 2015-05-09 ENCOUNTER — Other Ambulatory Visit: Payer: Self-pay | Admitting: Emergency Medicine

## 2015-05-11 NOTE — Telephone Encounter (Signed)
Dr Everlene Farrier, you saw pt for check up in Oct, but this med hasn't been refilled since 2014. Do you want to give RFs?

## 2015-05-15 ENCOUNTER — Telehealth: Payer: Self-pay

## 2015-05-15 NOTE — Telephone Encounter (Signed)
My suggestion would be to leave the patient on Pulmicort because he has done well with this. He is able to afford his medications

## 2015-05-15 NOTE — Telephone Encounter (Signed)
Called and advised pharm that Dr Everlene Farrier would like to leave pt on Pulmicort and that he believes that the pt can afford to pay out of pocket. Asked them to let me know if this plan does not work.

## 2015-05-15 NOTE — Telephone Encounter (Signed)
Dr Everlene Farrier, PA is needed for coverage of Pulmicort. It looks like pt has to have tried/failed preferred Asmanex and QVAR first. The only other notes I see show that pt was on Symbicort for a while, but not either of the preferred. Do you want to change pt to one of these?

## 2015-06-05 DIAGNOSIS — F319 Bipolar disorder, unspecified: Secondary | ICD-10-CM | POA: Diagnosis not present

## 2015-06-07 ENCOUNTER — Ambulatory Visit (INDEPENDENT_AMBULATORY_CARE_PROVIDER_SITE_OTHER): Payer: Medicare Other

## 2015-06-07 ENCOUNTER — Ambulatory Visit (INDEPENDENT_AMBULATORY_CARE_PROVIDER_SITE_OTHER): Payer: Medicare Other | Admitting: Emergency Medicine

## 2015-06-07 VITALS — BP 130/74 | HR 79 | Temp 98.0°F | Resp 18 | Ht 69.0 in | Wt 183.0 lb

## 2015-06-07 DIAGNOSIS — G500 Trigeminal neuralgia: Secondary | ICD-10-CM | POA: Diagnosis not present

## 2015-06-07 DIAGNOSIS — M5136 Other intervertebral disc degeneration, lumbar region: Secondary | ICD-10-CM | POA: Diagnosis not present

## 2015-06-07 DIAGNOSIS — M4726 Other spondylosis with radiculopathy, lumbar region: Secondary | ICD-10-CM | POA: Diagnosis not present

## 2015-06-07 DIAGNOSIS — Z6826 Body mass index (BMI) 26.0-26.9, adult: Secondary | ICD-10-CM | POA: Diagnosis not present

## 2015-06-07 DIAGNOSIS — R51 Headache: Secondary | ICD-10-CM | POA: Diagnosis not present

## 2015-06-07 DIAGNOSIS — M4806 Spinal stenosis, lumbar region: Secondary | ICD-10-CM | POA: Diagnosis not present

## 2015-06-07 DIAGNOSIS — R519 Headache, unspecified: Secondary | ICD-10-CM

## 2015-06-07 DIAGNOSIS — M25532 Pain in left wrist: Secondary | ICD-10-CM

## 2015-06-07 DIAGNOSIS — M4310 Spondylolisthesis, site unspecified: Secondary | ICD-10-CM | POA: Diagnosis not present

## 2015-06-07 MED ORDER — HYDROCODONE-ACETAMINOPHEN 5-325 MG PO TABS: 1.0000 | ORAL_TABLET | Freq: Four times a day (QID) | ORAL | Status: DC | PRN

## 2015-06-07 NOTE — Progress Notes (Signed)
By signing my name below, I, Gabriel Richardson, attest that this documentation has been prepared under the direction and in the presence of [name], MD.  Electronically Signed: [name], ED Scribe. 06/07/2015. 8:41 AM.   Chief Complaint:  Chief Complaint  Patient presents with  . Facial Pain    on going issue   . Wrist Pain    left wrist x1 mth   . Follow-up    was told to follow up from 10/19    HPI: Gabriel Richardson is a 70 y.o. male with hx of who reports to [facility] today complaining of right sided facial pain that began 5 days ago. Pt reports hx of trigeminal neuralgia of right face. He recently had a flare 5 days ago and reports pain has been persistent, which he rates 7/10 at this time. His last flare was 1 year ago. States pain tends to last 1-2 hours at a time. He has taken Neurontin and lyrica in the past but find hydrocodone works best for his pain. He takes tramadol as needed for back pain but not in the same time frame as hydrocodone.   Pt also complains of left wrist pain. He denies known injury. He reports pain with certain movements.  Pt is also needing a medication refill of Inderal, which he takes for his tremor.   Past Medical History  Diagnosis Date  . Depression   . Anxiety   . COPD (chronic obstructive pulmonary disease) ([place])   . Cervical stenosis of spinal canal   . Asthma   . Emphysema of lung ([place])   . Neuromuscular disorder [facility])    Past Surgical History  Procedure Laterality Date  . Spine surgery      Cervical C3-C5 anterior fusion  . Lumbar laminectomy    . Cervical fusion     Social History   Social History  . Marital Status: Married    Spouse Name: [name]  . Number of Children: 2  . Years of Education: BS   Occupational History  .      Works for himself   Social History Main Topics  . Smoking status: Former Smoker -- 1.50 packs/day for 25 years    Types: Cigarettes    Quit date: 05/20/1997  . Smokeless tobacco: Never Used  . Alcohol  Use: 1.2 oz/week    2 Shots of liquor per week     Comment: 7-10 drinks  . Drug Use: No  . Sexual Activity: Yes    Birth Control/ Protection: None   Other Topics Concern  . None   Social History Narrative   He currently works and Freight forwarder.       Patient lives at home with his wife Gabriel Richardson in a one-story home.  Patient has a college education. B.S. Patient is self employed.  They have two grown daughters.   Right handed.   Caffeine- one cup   Family History  Problem Relation Age of Onset  . Kidney disease Mother   . Breast cancer Mother     Living, 58  . Diabetes Mellitus II Father     Deceased, 50  . Tremor Father   . Depression Daughter   . Healthy Brother    No Known Allergies Prior to Admission medications   Medication Sig Start Date End Date Taking? Authorizing Provider  ALPRAZolam [name]) 0.5 MG tablet Take 0.5 mg by mouth daily as needed. 01/18/15  Yes Historical Provider, MD  lamoTRIgine (LAMICTAL) 200 MG  tablet Take 200 mg by mouth at bedtime. 01/18/15  Yes Historical Provider, MD  lisinopril (PRINIVIL,ZESTRIL) 20 MG tablet Take 1 tablet (20 mg total) by mouth daily. 03/03/15  Yes [name], MD  methocarbamol (ROBAXIN) 500 MG tablet 1 tablet twice a day 03/08/15  Yes Darlyne Russian, MD  propranolol ER (INDERAL LA) 60 MG 24 hr capsule Take 60 mg by mouth daily. 06/26/14  Yes Historical Provider, MD  sildenafil (VIAGRA) 100 MG tablet Take 1 tablet (100 mg total) by mouth daily as needed for erectile dysfunction. 03/08/15  Yes Darlyne Russian, MD  traMADol (ULTRAM) 50 MG tablet Take 50 mg by mouth 2 (two) times daily as needed. 02/27/15  Yes Historical Provider, MD  zolpidem (AMBIEN) 5 MG tablet Take 5 mg by mouth at bedtime. 12/12/14  Yes Historical Provider, MD  PULMICORT FLEXHALER 180 MCG/ACT inhaler INHALE 1 TO 2 PUFFS INTO LUNGS TWICE A DAY Patient not taking: Reported on 06/07/2015 05/11/15   Darlyne Russian, MD  VIAGRA 100 MG tablet Take 100 mg by mouth  as needed. 01/29/15   Historical Provider, MD     ROS: The patient denies fevers, chills, night sweats, unintentional weight loss, chest pain, palpitations, wheezing, dyspnea on exertion, nausea, vomiting, abdominal pain, dysuria, hematuria, melena, numbness, weakness, or tingling.   All other systems have been reviewed and were otherwise negative with the exception of those mentioned in the HPI and as above.    PHYSICAL EXAM: Filed Vitals:   06/07/15 0826  BP: 130/74  Pulse: 79  Temp: 98 F (36.7 C)  Resp: 18   Body mass index is 27.01 kg/(m^2).   General: Alert, no acute distress HEENT:  Normocephalic, atraumatic, oropharynx patent. Eye: [name] [facility] Cardiovascular:  Regular rate and rhythm, no rubs murmurs or gallops.  No Carotid bruits, radial pulse intact. No pedal edema.  Respiratory: Clear to auscultation bilaterally.  No wheezes, rales, or rhonchi.  No cyanosis, no use of accessory musculature Abdominal: No organomegaly, abdomen is soft and non-tender, positive bowel sounds.  No masses. Musculoskeletal: Gait intact. No edema, tenderness Skin: No rashes. Neurologic: Facial musculature symmetric. Cranial nerves II-XII intact. no focal weakness. Hypersensitivity along distribution of trigeminal nerve on the right.  Psychiatric: Patient acts appropriately throughout our interaction. Lymphatic: No cervical or submandibular lymphadenopathy   EKG/XRAY:   Primary read interpreted by Dr. [facility]lene Farrier at [facility]. Left  Wrist-  Negative for fracture.   ASSESSMENT/PLAN: Patient has a sprain of the ulnar styloid ligament. He was placed in a splint for this. Currently suffering from a bout of trigeminal neuralgia. He has a long history of this. He was give hydrocodone for pain. He was instructed he cannot take hydrocodone with tramadol.I personally performed the services described in this documentation, which was scribed in my presence. The recorded information has been reviewed and is  accurate.   Gross sideeffects, risk and benefits, and alternatives of medications d/w patient. Patient is aware that all medications have potential sideeffects and we are unable to predict every sideeffect or drug-drug interaction that may occur.  [name] MD 06/07/2015 8:41 AM

## 2015-06-07 NOTE — Patient Instructions (Signed)
Do not take your Ultram and hydrocodone together. Recheck 7-10 days if not improving

## 2015-06-08 ENCOUNTER — Other Ambulatory Visit: Payer: Self-pay | Admitting: Neurology

## 2015-06-08 NOTE — Telephone Encounter (Signed)
Rx refused.  Patient needs an appointment. 

## 2015-06-12 ENCOUNTER — Other Ambulatory Visit: Payer: Self-pay | Admitting: Neurology

## 2015-06-12 NOTE — Telephone Encounter (Signed)
Refused.  Patient needs an appointment.

## 2015-06-12 NOTE — Telephone Encounter (Signed)
PT called and needs a refill of Propranolol/Dawn CB# [phone]

## 2015-06-13 ENCOUNTER — Telehealth: Payer: Self-pay | Admitting: Neurology

## 2015-06-13 ENCOUNTER — Other Ambulatory Visit: Payer: Self-pay | Admitting: *Deleted

## 2015-06-13 MED ORDER — PROPRANOLOL HCL ER 60 MG PO CP24: 60.0000 mg | ORAL_CAPSULE | Freq: Every day | ORAL | Status: DC

## 2015-06-13 NOTE — Telephone Encounter (Signed)
Patient notified of upcoming appt on March 13 at 2:45.  Rx sent in to get him through until appt.

## 2015-06-13 NOTE — Telephone Encounter (Signed)
Patient call answering service overnight requesting for refill of propranolol 60mg  daily which he takes for tremors.  He was last seen in December 2014 and needs a follow-up visit prior to medication refills.  OK to give one refill for 30-day supply.  Knoxx Boeding K. Posey Pronto, DO

## 2015-07-31 ENCOUNTER — Encounter: Payer: Self-pay | Admitting: Neurology

## 2015-07-31 ENCOUNTER — Ambulatory Visit (INDEPENDENT_AMBULATORY_CARE_PROVIDER_SITE_OTHER): Payer: Medicare Other | Admitting: Neurology

## 2015-07-31 VITALS — BP 130/84 | HR 71 | Ht 69.0 in | Wt 192.4 lb

## 2015-07-31 DIAGNOSIS — G500 Trigeminal neuralgia: Secondary | ICD-10-CM | POA: Diagnosis not present

## 2015-07-31 DIAGNOSIS — G250 Essential tremor: Secondary | ICD-10-CM

## 2015-07-31 MED ORDER — PREGABALIN 50 MG PO CAPS: 50.0000 mg | ORAL_CAPSULE | Freq: Three times a day (TID) | ORAL | Status: DC

## 2015-07-31 MED ORDER — PROPRANOLOL HCL ER 60 MG PO CP24: 60.0000 mg | ORAL_CAPSULE | Freq: Every day | ORAL | Status: DC

## 2015-07-31 MED ORDER — PREGABALIN 75 MG PO CAPS: 75.0000 mg | ORAL_CAPSULE | Freq: Two times a day (BID) | ORAL | Status: DC

## 2015-07-31 MED ORDER — PREGABALIN 100 MG PO CAPS: 100.0000 mg | ORAL_CAPSULE | Freq: Two times a day (BID) | ORAL | Status: DC

## 2015-07-31 NOTE — Patient Instructions (Signed)
1.  Start Lyrica as follows    AM  PM  Week 1 50mg   50mg   Week 2 75mg   75mg   Week 3 100mg   100mg     2.  Continue Inderal 60mg  daily  3.  Call with update in 3-4 [place]

## 2015-07-31 NOTE — Progress Notes (Signed)
Follow-up Visit   Date: 07/31/2015    Gabriel Richardson MRN: [id number] DOB: [birth year]   Interim History: Gabriel Richardson is a 70 y.o. right-handed Caucasian male with history of depression, asthma, hypertension, and intention tremor returning to the clinic with new complaints of right trigeminal neuralgia.  The patient was accompanied to the clinic by self.  History of present illness: Initial visit 04/30/2013:  He had previously been a patient of Dr. [name] who he had been seeing since 1995. He initially saw Dr. [name] for right trigeminal neuralgia which he had frequent flares of since 1997 - early 2000. He had previously tried trileptal, neurontin, and lyrica and had the most relief with Lyrica. He has not had a flare in the past 14 years.  Around 2012, he started developing muscle cramps and left hand tremors and was concerned about multiple sclerosis. He had an evaluation including imaging. Although there was white matter changes on his MRI, Dr. [name] did not find that his exam and imaging was consistent with demyelinating disease. He also had CSF testing in January 2014 which did not show any inflammatory changes. He treated him symptomatically and was started on propranolol 60mg  which has signifcantly helped with his tremors. He takes robaxin 500mg  twice daily as needed which also resolves his muscle cramps.  He used to have occasionally has bilateral painful hand cramps and severe neck pain. He underwent anterior cervical fusion by Dr. [name] in 2010 which totally resolved his symptoms. He has also has history of left L5 radiculopathy status post L5-S1 laminectomy.  More recently, he saw Dr. [name] in July for new left ptosis. Myasthenia antibodies were checked and was normal. He declined to have additional testing done to evaluate this.   UPDATE 07/31/2015:  Late 2016, he began experiencing right facial shooting paresthesias over the upper and lower lip especially with light  tactile stimuli, chewing, brushing, and talking. He has exquisite sensitivity over the right upper lip.  He denies painful paresthesias over the forehead and cheek.  Denies any changes in taste or facial weakness.    His hand tremors are well-controlled on inderal 60mg  and he has noticed that it is improved with alcohol.  His cramps are nearly resolved with robaxin.  He has chronic low back pain and sees Dr. [name] for this and have been contemplating surgery.  He takes tramadol as needed.   Medications:  Current Outpatient Prescriptions on File Prior to Visit  Medication Sig Dispense Refill  . ALPRAZolam (XANAX) 0.5 MG tablet Take 0.5 mg by mouth daily as needed.  5  . HYDROcodone-acetaminophen (NORCO) 5-325 MG tablet Take 1 tablet by mouth every 6 (six) hours as needed for moderate pain. 20 tablet 0  . lamoTRIgine (LAMICTAL) 200 MG tablet Take 200 mg by mouth at bedtime.  1  . lisinopril (PRINIVIL,ZESTRIL) 20 MG tablet Take 1 tablet (20 mg total) by mouth daily. 90 tablet 3  . methocarbamol (ROBAXIN) 500 MG tablet 1 tablet twice a day 60 tablet 5  . propranolol ER (INDERAL LA) 60 MG 24 hr capsule Take 1 capsule (60 mg total) by mouth daily. 60 capsule 0  . sildenafil (VIAGRA) 100 MG tablet Take 1 tablet (100 mg total) by mouth daily as needed for erectile dysfunction. 12 tablet 11  . traMADol (ULTRAM) 50 MG tablet Take 50 mg by mouth 2 (two) times daily as needed.    Marland Kitchen VIAGRA 100 MG tablet Take 100 mg by mouth as needed.  3   No current facility-administered medications on file prior to visit.    Allergies: No Known Allergies  Review of Systems:  CONSTITUTIONAL: No fevers, chills, night sweats, or weight loss.  EYES: No visual changes or eye pain ENT: No hearing changes.  No history of nose bleeds.   RESPIRATORY: No cough, wheezing and shortness of breath.   CARDIOVASCULAR: Negative for chest pain, and palpitations.   GI: Negative for abdominal discomfort, blood in stools or black  stools.  No recent change in bowel habits.   GU:  No history of incontinence.   MUSCLOSKELETAL: No history of joint pain or swelling.  No myalgias.   SKIN: Negative for lesions, rash, and itching.   ENDOCRINE: Negative for cold or heat intolerance, polydipsia or goiter.   PSYCH:  No depression or anxiety symptoms.   NEURO: As Above.   Vital Signs:  BP 130/84 mmHg  Pulse 71  Ht 5\' 9"  (1.753 m)  Wt 192 lb 6 oz (87.261 kg)  BMI 28.40 kg/m2  SpO2 96%  Neurological Exam: MENTAL STATUS including orientation to time, place, person, recent and remote memory, attention span and concentration, language, and fund of knowledge is normal.  Speech is not dysarthric.  CRANIAL NERVES: No visual field defects. Pupils equal round and reactive to light.  Normal conjugate, extra-ocular eye movements in all directions of gaze.  Mild left ptosis (old). Normal facial sensation over right V1, there is hyperesthesias to temperature and pin prick over the right V2-3.  Face is symmetric. Palate elevates symmetrically.  Tongue is midline.  MOTOR:  Motor strength is 5/5 in all extremities, except left toe extension is 4+/5.  Very mild right postural tremor.  No pronator drift.  Tone is normal.    MSRs:  Reflexes are 2+/4 throughout.  SENSORY:  Intact to vibration throughout.  COORDINATION/GAIT:  Normal finger-to- nose-finger.  Intact rapid alternating movements bilaterally.  Gait narrow based and stable, mildly reduced arm swing bilaterally.   Data: AChR blocking and binding 11/02/2012 - neg  CSF 06/02/2012: R0 W1 G63 P43 IgG index 0.43 OCB -neg  MRI brain wwo contrast 07/08/2009: 1. Advanced white matter disease in a pattern compatible with multiple sclerosis. Mild progression since 2004. No areas of  enhancement.  2. No acute intracranial abnormality.  MRI cervical spine 09/02/2008: 1. Moderate central and bilateral foraminal stenosis at C4-5. This is the worst level.  2. Moderate left and mild  right foraminal narrowing at C5-6.  3. Asymmetric right disc osteophyte complex at C6-7 without significant stenosis.  4. Mild to moderate right foraminal stenosis at C3-4.  5. Mild central canal stenosis at C3-4.  Lab Results  Component Value Date   TSH 0.738 01/05/2015   IMPRESSION/PLAN: 1. Right trigeminal neuralgia - new   - Start Lyrica 50mg  BID and titrate to 100mg  BID (schedule provided)  - Call with update in 3-4 [place]  2.  Benign essential tremor of the hands - Well controlled on propranolol 60 mg daily - refills provided for 1 year  3. Muscle cramps - Well controlled with Robaxin 500 mg twice daily as needed  4. Left ptosis - Likely an incidental finding and old - No features on examination to suggest myasthenia (AChR binding and blocking antibodies are negative), Horner's syndrome, or CN III palsy  Return to clinic as needed   The duration of this appointment visit was 25 minutes of face-to-face time with the patient.  Greater than 50% of this time was spent in  counseling, explanation of diagnosis, planning of further management, and coordination of care.   Thank you for allowing me to participate in patient's care.  If I can answer any additional questions, I would be pleased to do so.    Sincerely,    Clydine Parkison K. Posey Pronto, DO

## 2015-08-02 ENCOUNTER — Encounter: Payer: Self-pay | Admitting: Neurology

## 2015-08-03 ENCOUNTER — Other Ambulatory Visit: Payer: Self-pay | Admitting: Emergency Medicine

## 2015-08-03 MED ORDER — HYDROCODONE-ACETAMINOPHEN 5-325 MG PO TABS: 1.0000 | ORAL_TABLET | Freq: Four times a day (QID) | ORAL | Status: DC | PRN

## 2015-08-14 ENCOUNTER — Ambulatory Visit (INDEPENDENT_AMBULATORY_CARE_PROVIDER_SITE_OTHER): Payer: Medicare Other | Admitting: Emergency Medicine

## 2015-08-14 ENCOUNTER — Other Ambulatory Visit: Payer: Self-pay | Admitting: Neurology

## 2015-08-14 ENCOUNTER — Ambulatory Visit (INDEPENDENT_AMBULATORY_CARE_PROVIDER_SITE_OTHER): Payer: Medicare Other

## 2015-08-14 VITALS — BP 118/64 | HR 69 | Temp 99.1°F | Resp 16 | Ht 69.0 in | Wt 191.0 lb

## 2015-08-14 DIAGNOSIS — S60221A Contusion of right hand, initial encounter: Secondary | ICD-10-CM | POA: Diagnosis not present

## 2015-08-14 DIAGNOSIS — S60011A Contusion of right thumb without damage to nail, initial encounter: Secondary | ICD-10-CM

## 2015-08-14 DIAGNOSIS — S6991XA Unspecified injury of right wrist, hand and finger(s), initial encounter: Secondary | ICD-10-CM | POA: Diagnosis not present

## 2015-08-14 DIAGNOSIS — M79641 Pain in right hand: Secondary | ICD-10-CM | POA: Diagnosis not present

## 2015-08-14 DIAGNOSIS — M79644 Pain in right finger(s): Secondary | ICD-10-CM | POA: Diagnosis not present

## 2015-08-14 NOTE — Progress Notes (Signed)
Patient ID: Gabriel Richardson, male   DOB: 1946-02-17, 70 y.o.   MRN: [id number]     By signing my name below, I, Zola Button, attest that this documentation has been prepared under the direction and in the presence of [name], MD.  Electronically Signed: Zola Button, Medical Scribe. 08/14/2015. 11:52 AM.   Chief Complaint:  Chief Complaint  Patient presents with  . Hand Injury    bruising, fall, right thumb, x 2 days    HPI: Gabriel Richardson is a 70 y.o. male with a history of neuromuscular disorder who reports to [facility] today complaining of sudden onset right thumb pain secondary to a fall that occurred 2 days ago after tripping over an electrical cord.  Patient was recently put on Lyrica 100 mg twice a day by Dr. Posey Pronto, but he notes this has not been controlling his symptoms.  Past Medical History  Diagnosis Date  . Depression   . Anxiety   . COPD (chronic obstructive pulmonary disease) ([place])   . Cervical stenosis of spinal canal   . Asthma   . Emphysema of lung ([place])   . Neuromuscular disorder [facility])    Past Surgical History  Procedure Laterality Date  . Spine surgery      Cervical C3-C5 anterior fusion  . Lumbar laminectomy    . Cervical fusion     Social History   Social History  . Marital Status: Married    Spouse Name: [name]  . Number of Children: 2  . Years of Education: BS   Occupational History  .      Works for himself   Social History Main Topics  . Smoking status: Former Smoker -- 1.50 packs/day for 25 years    Types: Cigarettes    Quit date: 05/20/1997  . Smokeless tobacco: Never Used  . Alcohol Use: 1.2 oz/week    2 Shots of liquor per week     Comment: 7-10 drinks  . Drug Use: No  . Sexual Activity: Yes    Birth Control/ Protection: None   Other Topics Concern  . None   Social History Narrative   He currently works and Freight forwarder.       Patient lives at home with his wife [name] in a one-story home.  Patient has a college  education. B.S. Patient is self employed.  They have two grown daughters.   Right handed.   Caffeine- one cup   Family History  Problem Relation Age of Onset  . Kidney disease Mother   . Breast cancer Mother     Living, 82  . Diabetes Mellitus II Father     Deceased, 85  . Tremor Father   . Depression Daughter   . Healthy Brother    No Known Allergies Prior to Admission medications   Medication Sig Start Date End Date Taking? Authorizing Provider  ALPRAZolam [name]) 0.5 MG tablet Take 0.5 mg by mouth daily as needed. 01/18/15  Yes Historical Provider, MD  ARIPiprazole (ABILIFY) 2 MG tablet Take 2 mg by mouth every morning. 07/05/15  Yes Historical Provider, MD  HYDROcodone-acetaminophen (NORCO) 5-325 MG tablet Take 1 tablet by mouth every 6 (six) hours as needed for moderate pain. 08/03/15  Yes Darlyne Russian, MD  lamoTRIgine (LAMICTAL) 200 MG tablet Take 200 mg by mouth at bedtime. 01/18/15  Yes Historical Provider, MD  lisinopril (PRINIVIL,ZESTRIL) 20 MG tablet Take 1 tablet (20 mg total) by mouth daily. 03/03/15  Yes [name], MD  methocarbamol (ROBAXIN) 500 MG tablet 1 tablet twice a day 03/08/15  Yes Darlyne Russian, MD  pregabalin (LYRICA) 100 MG capsule Take 1 capsule (100 mg total) by mouth 2 (two) times daily. 07/31/15  Yes Donika K Patel, DO  pregabalin (LYRICA) 50 MG capsule Take 1 capsule (50 mg total) by mouth 3 (three) times daily. 07/31/15  Yes Donika K Patel, DO  pregabalin (LYRICA) 75 MG capsule Take 1 capsule (75 mg total) by mouth 2 (two) times daily. 07/31/15  Yes Donika K Patel, DO  propranolol ER (INDERAL LA) 60 MG 24 hr capsule Take 1 capsule (60 mg total) by mouth daily. 07/31/15  Yes Donika K Patel, DO  propranolol ER (INDERAL LA) 60 MG 24 hr capsule TAKE ONE CAPSULE BY MOUTH EVERY DAY 08/14/15  Yes Donika K Patel, DO  sildenafil (VIAGRA) 100 MG tablet Take 1 tablet (100 mg total) by mouth daily as needed for erectile dysfunction. 03/08/15  Yes Darlyne Russian, MD    traMADol (ULTRAM) 50 MG tablet Take 50 mg by mouth 2 (two) times daily as needed. 02/27/15  Yes Historical Provider, MD  VIAGRA 100 MG tablet Take 100 mg by mouth as needed. 01/29/15  Yes Historical Provider, MD  zolpidem (AMBIEN) 10 MG tablet Take 10 mg by mouth at bedtime. 07/19/15  Yes Historical Provider, MD     ROS: The patient denies fevers, chills, night sweats, unintentional weight loss, chest pain, palpitations, wheezing, dyspnea on exertion, nausea, vomiting, abdominal pain, dysuria, hematuria, melena, numbness, weakness, or tingling.   All other systems have been reviewed and were otherwise negative with the exception of those mentioned in the HPI and as above.    PHYSICAL EXAM: Filed Vitals:   08/14/15 1051  BP: 118/64  Pulse: 69  Temp: 99.1 F (37.3 C)  Resp: 16   Body mass index is 28.19 kg/(m^2).   General: Alert, no acute distress HEENT:  Normocephalic, atraumatic, oropharynx patent. Eye: [name] [facility] Cardiovascular:  Regular rate and rhythm, no rubs murmurs or gallops.  No Carotid bruits, radial pulse intact. No pedal edema.  Respiratory: Clear to auscultation bilaterally.  No wheezes, rales, or rhonchi.  No cyanosis, no use of accessory musculature Abdominal: No organomegaly, abdomen is soft and non-tender, positive bowel sounds.  No masses. Musculoskeletal: Gait intact. Swelling over right thenar eminence. No tednerness over the first MCP joint.  tenderness over the base of the 1st metacarpal. Skin: No rashes. Neurologic: Facial musculature symmetric. Psychiatric: Patient acts appropriately throughout our interaction. Lymphatic: No cervical or submandibular lymphadenopathy    LABS:    EKG/XRAY:   Primary read interpreted by Dr. Everlene Farrier at [facility]. Dg Hand 2 View Right  08/14/2015  CLINICAL DATA:  Status post fall with persistent hand and thumb pain EXAM: RIGHT HAND - 2 VIEW COMPARISON:  Left thumb series of today's date FINDINGS: The bones of the hand are  adequately mineralized for age. There are is mildjoint space narrowing of the interphalangeal and metacarpophalangeal joints of the second through fifth fingers. There is moderate narrowing of the IP joint of the thumb. The MCP joint exhibits only minimal narrowing. There is also joint space narrowing of the first carpometacarpal joint. There is no acute fracture nor dislocation. IMPRESSION: Negative.  There are osteoarthritic changes as described. Electronically Signed   By: David  Martinique M.D.   On: 08/14/2015 12:23   Dg Finger Thumb Right  08/14/2015  CLINICAL DATA:  Status post fall with persistent thumb and hand injury EXAM:  RIGHT THUMB 2+V COMPARISON:  Low right hand series of today's date FINDINGS: The bones of the thumb are adequately mineralized. There is moderate joint space narrowing and marginal osteophyte formation at the IP joint of the thumb. There is mild narrowing of the first metacarpophalangeal joint. There is mild degenerative change of the first carpometacarpal joint. There is no acute fracture nor dislocation. The soft tissues are unremarkable. 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 There is no acute fracture nor dislocation. Electronically Signed   By: David  Martinique M.D.   On: 08/14/2015 12:24    ASSESSMENT/PLAN: I suspect this is a ligamentous injury at the base of the first metacarpal he was placed in an Ace wrap. He will call me if symptoms persist and I will refer him to the hand orthopedist.I personally performed the services described in this documentation, which was scribed in my presence. The recorded information has been reviewed and is accurate.    Gross sideeffects, risk and benefits, and alternatives of medications d/w patient. Patient is aware that all medications have potential sideeffects and we are unable to predict every sideeffect or drug-drug interaction that may occur.  [name] MD 08/14/2015 11:52 AM

## 2015-08-14 NOTE — Patient Instructions (Signed)
     IF you received an x-ray today, you will receive an invoice from [facility]. Please contact [facility] at [phone] with questions or concerns regarding your invoice.   IF you received labwork today, you will receive an invoice from [facility]. Please contact [facility] at [phone] with questions or concerns regarding your invoice.   Our billing staff will not be able to assist you with questions regarding bills from these companies.  You will be contacted with the lab results as soon as they are available. The fastest way to get your results is to activate your My Chart account. Instructions are located on the last page of this paperwork. If you have not heard from us regarding the results in [place], please contact this office.      

## 2015-08-14 NOTE — Telephone Encounter (Signed)
Rx was sent in at patient's last visit.

## 2015-08-23 ENCOUNTER — Encounter: Payer: Self-pay | Admitting: Neurology

## 2015-08-24 MED ORDER — PREGABALIN 100 MG PO CAPS: 100.0000 mg | ORAL_CAPSULE | Freq: Three times a day (TID) | ORAL | Status: DC

## 2015-08-31 DIAGNOSIS — F319 Bipolar disorder, unspecified: Secondary | ICD-10-CM | POA: Diagnosis not present

## 2015-09-19 DIAGNOSIS — F319 Bipolar disorder, unspecified: Secondary | ICD-10-CM | POA: Diagnosis not present

## 2015-09-29 ENCOUNTER — Telehealth: Payer: Self-pay | Admitting: Neurology

## 2015-09-29 NOTE — Telephone Encounter (Signed)
Gabriel Richardson [birth year]. He was needing to have [facility] [address]. It has been changed from 2 100 capsules to 3 100 capsules a day. He needs the 3 100 capsules refilled. His number is [id number]. Thank you

## 2015-09-29 NOTE — Telephone Encounter (Signed)
Informed patient that I have already taken care of this.

## 2015-10-24 DIAGNOSIS — H10022 Other mucopurulent conjunctivitis, left eye: Secondary | ICD-10-CM | POA: Diagnosis not present

## 2015-11-27 DIAGNOSIS — F319 Bipolar disorder, unspecified: Secondary | ICD-10-CM | POA: Diagnosis not present

## 2015-12-11 ENCOUNTER — Telehealth: Payer: Self-pay | Admitting: Neurology

## 2015-12-12 NOTE — Telephone Encounter (Signed)
Patient given instructions.  Gabriel Richardson is doing the 100 mg bid and would like to keep it like that for a while until Gabriel Richardson sees Dr. Posey Pronto again.

## 2015-12-12 NOTE — Telephone Encounter (Signed)
Please instruct him to take Lyrica 100mg  twice daily, instead ot TID. If he has tried this, we can send Rx for 75mg  tablet to take twice daily.

## 2015-12-12 NOTE — Telephone Encounter (Signed)
Please advise 

## 2015-12-15 DIAGNOSIS — M4726 Other spondylosis with radiculopathy, lumbar region: Secondary | ICD-10-CM | POA: Diagnosis not present

## 2015-12-15 DIAGNOSIS — M4310 Spondylolisthesis, site unspecified: Secondary | ICD-10-CM | POA: Diagnosis not present

## 2015-12-15 DIAGNOSIS — G500 Trigeminal neuralgia: Secondary | ICD-10-CM | POA: Diagnosis not present

## 2015-12-15 DIAGNOSIS — M5136 Other intervertebral disc degeneration, lumbar region: Secondary | ICD-10-CM | POA: Diagnosis not present

## 2015-12-15 DIAGNOSIS — M4806 Spinal stenosis, lumbar region: Secondary | ICD-10-CM | POA: Diagnosis not present

## 2015-12-15 DIAGNOSIS — Z6828 Body mass index (BMI) 28.0-28.9, adult: Secondary | ICD-10-CM | POA: Diagnosis not present

## 2015-12-26 ENCOUNTER — Encounter: Payer: Self-pay | Admitting: Neurology

## 2015-12-26 ENCOUNTER — Ambulatory Visit (INDEPENDENT_AMBULATORY_CARE_PROVIDER_SITE_OTHER): Payer: Medicare Other | Admitting: Neurology

## 2015-12-26 VITALS — BP 124/80 | HR 69 | Ht 69.0 in | Wt 200.5 lb

## 2015-12-26 DIAGNOSIS — G250 Essential tremor: Secondary | ICD-10-CM

## 2015-12-26 DIAGNOSIS — G500 Trigeminal neuralgia: Secondary | ICD-10-CM | POA: Diagnosis not present

## 2015-12-26 NOTE — Progress Notes (Signed)
Follow-up Visit   Date: 12/26/15    Gabriel Richardson MRN: [id number] DOB: [birth year]   Interim History: Gabriel Richardson is a 70 y.o. right-handed Caucasian male with history of depression, asthma, hypertension, and intention tremor returning to the clinic with new complaints of right trigeminal neuralgia.  The patient was accompanied to the clinic by self.  History of present illness: Initial visit 04/30/2013:  He had previously been a patient of Dr. [name] who he had been seeing since 1995. He initially saw Dr. [name] for right trigeminal neuralgia which he had frequent flares of since 1997 - early 2000. He had previously tried trileptal, neurontin, and lyrica and had the most relief with Lyrica. He has not had a flare in the past 14 years.  Around 2012, he started developing muscle cramps and left hand tremors and was concerned about multiple sclerosis. He had an evaluation including imaging. Although there was white matter changes on his MRI, Dr. [name] did not find that his exam and imaging was consistent with demyelinating disease. He also had CSF testing in January 2014 which did not show any inflammatory changes. He treated him symptomatically and was started on propranolol 60mg  which has signifcantly helped with his tremors. He takes robaxin 500mg  twice daily as needed which also resolves his muscle cramps.  He used to have occasionally has bilateral painful hand cramps and severe neck pain. He underwent anterior cervical fusion by Dr. [name] in 2010 which totally resolved his symptoms. He has also has history of left L5 radiculopathy status post L5-S1 laminectomy.  More recently, he saw Dr. [name] in July for new left ptosis. Myasthenia antibodies were checked and was normal. He declined to have additional testing done to evaluate this.  UPDATE 07/31/2015:  Late 2016, he began experiencing right facial shooting paresthesias over the upper and lower lip especially with light tactile  stimuli, chewing, brushing, and talking. He has exquisite sensitivity over the right upper lip.  He denies painful paresthesias over the forehead and cheek.  Denies any changes in taste or facial weakness.    His hand tremors are well-controlled on inderal 60mg  and he has noticed that it is improved with alcohol.  His cramps are nearly resolved with robaxin.  He has chronic low back pain and sees Dr. [name] for this and have been contemplating surgery.  He takes tramadol as needed.   UPDATE 12/26/2015:  He saw Dr. [name] for low back pain follow-up, but also discussed his trigeminal neuralgia who states that he is able to microvascular decompression surgery for this, if medications are ineffective.  He currently takes Lyrica 100mg  twice daily, because he is unable to tolerate extra dose in the afternoon (blurry vision, sedation).  He has previously tried neurontin and tegretol.    Medications:  Current Outpatient Prescriptions on File Prior to Visit  Medication Sig Dispense Refill  . ALPRAZolam (XANAX) 0.5 MG tablet Take 0.5 mg by mouth daily as needed.  5  . ARIPiprazole (ABILIFY) 2 MG tablet Take 2 mg by mouth every morning.  4  . HYDROcodone-acetaminophen (NORCO) 5-325 MG tablet Take 1 tablet by mouth every 6 (six) hours as needed for moderate pain. 20 tablet 0  . lamoTRIgine (LAMICTAL) 200 MG tablet Take 200 mg by mouth at bedtime.  1  . lisinopril (PRINIVIL,ZESTRIL) 20 MG tablet Take 1 tablet (20 mg total) by mouth daily. 90 tablet 3  . methocarbamol (ROBAXIN) 500 MG tablet 1 tablet twice a day 60  tablet 5  . pregabalin (LYRICA) 100 MG capsule Take 1 capsule (100 mg total) by mouth 3 (three) times daily. 90 capsule 5  . pregabalin (LYRICA) 50 MG capsule Take 1 capsule (50 mg total) by mouth 3 (three) times daily. 21 capsule 0  . pregabalin (LYRICA) 75 MG capsule Take 1 capsule (75 mg total) by mouth 2 (two) times daily. 14 capsule 0  . propranolol ER (INDERAL LA) 60 MG 24 hr capsule Take 1  capsule (60 mg total) by mouth daily. 90 capsule 3  . propranolol ER (INDERAL LA) 60 MG 24 hr capsule TAKE ONE CAPSULE BY MOUTH EVERY DAY 60 capsule 1  . sildenafil (VIAGRA) 100 MG tablet Take 1 tablet (100 mg total) by mouth daily as needed for erectile dysfunction. 12 tablet 11  . traMADol (ULTRAM) 50 MG tablet Take 50 mg by mouth 2 (two) times daily as needed.    Marland Kitchen VIAGRA 100 MG tablet Take 100 mg by mouth as needed.  3  . zolpidem (AMBIEN) 10 MG tablet Take 10 mg by mouth at bedtime.  1   No current facility-administered medications on file prior to visit.     Allergies: No Known Allergies  Review of Systems:  CONSTITUTIONAL: No fevers, chills, night sweats, or weight loss.  EYES: No visual changes or eye pain ENT: No hearing changes.  No history of nose bleeds.   RESPIRATORY: No cough, wheezing and shortness of breath.   CARDIOVASCULAR: Negative for chest pain, and palpitations.   GI: Negative for abdominal discomfort, blood in stools or black stools.  No recent change in bowel habits.   GU:  No history of incontinence.   MUSCLOSKELETAL: No history of joint pain or swelling.  No myalgias.   SKIN: Negative for lesions, rash, and itching.   ENDOCRINE: Negative for cold or heat intolerance, polydipsia or goiter.   PSYCH:  No depression or anxiety symptoms.   NEURO: As Above.   Vital Signs:  BP 124/80   Pulse 69   Ht 5\' 9"  (1.753 m)   Wt 200 lb 8 oz (90.9 kg)   SpO2 97%   BMI 29.61 kg/m   Neurological Exam: MENTAL STATUS including orientation to time, place, person, recent and remote memory, attention span and concentration, language, and fund of knowledge is normal.  Speech is not dysarthric.  CRANIAL NERVES:  Pupils equal round and reactive to light.  Normal conjugate, extra-ocular eye movements in all directions of gaze.  Mild left ptosis (old). Normal facial sensation over right V1, there is hyperesthesias to temperature and pin prick over the right V2-3.  Face is  symmetric. Palate elevates symmetrically.  Tongue is midline.  MOTOR:  Motor strength is 5/5 in all extremities, except left toe extension is 4+/5.  Very mild right postural tremor.     MSRs:  Reflexes are 2+/4 throughout.  SENSORY:  Vibration intact throughout.   COORDINATION/GAIT:    Gait narrow based and stable, mildly reduced arm swing bilaterally.   Data: AChR blocking and binding 11/02/2012 - neg  CSF 06/02/2012: R0 W1 G63 P43 IgG index 0.43 OCB -neg  MRI brain wwo contrast 07/08/2009: 1. Advanced white matter disease in a pattern compatible with multiple sclerosis. Mild progression since 2004. No areas of  enhancement.  2. No acute intracranial abnormality.  MRI cervical spine 09/02/2008: 1. Moderate central and bilateral foraminal stenosis at C4-5. This is the worst level.  2. Moderate left and mild right foraminal narrowing at C5-6.  3.  Asymmetric right disc osteophyte complex at C6-7 without significant stenosis.  4. Mild to moderate right foraminal stenosis at C3-4.  5. Mild central canal stenosis at C3-4.  Lab Results  Component Value Date   TSH 0.738 01/05/2015    IMPRESSION/PLAN: 1. Right trigeminal neuralgia - worsening  - Previously tried gabapentin and tegretol  - He already takes lamictal 200mg  at bedtime as a mood stabilizer  - Continue Lyrica 100mg  in the morning and at bedtime.  Add extra 50-75mg  in the afternoon (samples provided)  2.  Benign essential tremor of the hands, stable - Well controlled on propranolol 60 mg daily   3. Muscle cramps - stable on robaxin 500 mg twice daily as needed  4. Left ptosis - Likely an incidental finding and old - No features on examination to suggest myasthenia (AChR binding and blocking antibodies are negative), Horner's syndrome, or CN III palsy  Return to clinic as needed   The duration of this appointment visit was 25 minutes of  face-to-face time with the patient.  Greater than 50% of this time was spent in counseling, explanation of diagnosis, planning of further management, and coordination of care.   Thank you for allowing me to participate in patient's care.  If I can answer any additional questions, I would be pleased to do so.    Sincerely,    Brooke Payes K. Posey Pronto, DO

## 2015-12-26 NOTE — Patient Instructions (Signed)
Continue Lyrica 100mg  in the morning and bedtime You can try Lyrica 50mg  in the afternoon for a week, if this does not help with pain, increase to 75mg  in the afternoon  Call my office to let us know which dose you are tolerating and we can send the prescription to your pharmacy

## 2016-01-08 ENCOUNTER — Telehealth: Payer: Self-pay | Admitting: Neurology

## 2016-01-08 NOTE — Telephone Encounter (Signed)
Gabriel Richardson 01-06-2046.  He has been on sample packs of Lyrica medication His # is [id number]. It is working 50 MG is the best one for him he believes. He would like to have a 90 day supply. [facility] [address] Thank you

## 2016-01-09 ENCOUNTER — Other Ambulatory Visit: Payer: Self-pay | Admitting: *Deleted

## 2016-01-09 MED ORDER — PREGABALIN 50 MG PO CAPS: 50.0000 mg | ORAL_CAPSULE | Freq: Two times a day (BID) | ORAL | 1 refills | Status: DC

## 2016-01-09 NOTE — Telephone Encounter (Signed)
FYI.  I will send in Rx.  Please route this back to me.  Thanks.

## 2016-01-09 NOTE — Telephone Encounter (Signed)
He was returning your call. He takes the medication twice a day. You can reach him back at [phone]. Thank you

## 2016-01-09 NOTE — Telephone Encounter (Signed)
Left message for patient to call me back. 

## 2016-01-09 NOTE — Telephone Encounter (Signed)
Rx faxed

## 2016-01-09 NOTE — Telephone Encounter (Signed)
Great, please confirm how many times a day he is taking lyrica 50mg  and send Rx.

## 2016-01-10 DIAGNOSIS — H2513 Age-related nuclear cataract, bilateral: Secondary | ICD-10-CM | POA: Diagnosis not present

## 2016-01-10 DIAGNOSIS — H35033 Hypertensive retinopathy, bilateral: Secondary | ICD-10-CM | POA: Diagnosis not present

## 2016-01-10 DIAGNOSIS — H5203 Hypermetropia, bilateral: Secondary | ICD-10-CM | POA: Diagnosis not present

## 2016-01-12 ENCOUNTER — Telehealth: Payer: Self-pay

## 2016-01-12 NOTE — Telephone Encounter (Signed)
PATIENT STATES DR. DAUB IS HIS PCP AND DR. DAUB TOLD HIM TO CALL BACK BEFORE HE RETIRES AND HE WOULD RECOMMEND A NEW PCP AT [facility]. THIS OFFICE IS CLOSER TO HIS HOUSE. HE WOULD LIKE DR. DAUB TO CALL HIM. BEST PHONE [phone] (CELL)  [place]

## 2016-01-14 NOTE — Telephone Encounter (Signed)
let him know I will call him next week.

## 2016-01-15 NOTE — Telephone Encounter (Signed)
Pt.notified

## 2016-01-25 ENCOUNTER — Other Ambulatory Visit: Payer: Self-pay

## 2016-01-25 NOTE — Telephone Encounter (Signed)
Patient called regarding a refill of Xanex. Pharmacy told him to bring in a hard copy rx. Patient did not want to schedule an apt to be seen. Please advise.  [phone]

## 2016-01-30 NOTE — Telephone Encounter (Signed)
Dr Everlene Farrier, pt was last seen in March for acute issue. Hasn't been seen for anxiety/insomnia for quite some time. Do you want to RF or does pt need OV first? Last Rx was 01/18/15 for 6 mos of RFs.

## 2016-01-31 NOTE — Telephone Encounter (Signed)
Please call patient. His Xanax needs to be refilled under the direction of his psychiatrist.

## 2016-02-01 NOTE — Telephone Encounter (Signed)
Pt stated that his psychiatrist does manage his xanax. He had asked for a RF of hydrocodone. Pt advised that he takes it only very occasionally for break through pain for his trigeminal neuralgia which is mostly managed by Lyrica. He reported that when he has a bad flare up that wasn't controlled by Lyrica, if he takes one dose of hydrocodone it usually stops the pain and he doesn't have another flare of bad pain for quite a while. I checked with pt to see plans for new PCP and he stated he has an appt w/ Dr Yaakov Guthrie at the end of Oct. I will pend the hydrocodone.

## 2016-02-02 MED ORDER — HYDROCODONE-ACETAMINOPHEN 5-325 MG PO TABS: 1.0000 | ORAL_TABLET | Freq: Four times a day (QID) | ORAL | 0 refills | Status: DC | PRN

## 2016-02-13 DIAGNOSIS — Z23 Encounter for immunization: Secondary | ICD-10-CM | POA: Diagnosis not present

## 2016-02-26 DIAGNOSIS — F319 Bipolar disorder, unspecified: Secondary | ICD-10-CM | POA: Diagnosis not present

## 2016-03-18 DIAGNOSIS — G250 Essential tremor: Secondary | ICD-10-CM | POA: Diagnosis not present

## 2016-03-18 DIAGNOSIS — J449 Chronic obstructive pulmonary disease, unspecified: Secondary | ICD-10-CM | POA: Diagnosis not present

## 2016-03-18 DIAGNOSIS — J45909 Unspecified asthma, uncomplicated: Secondary | ICD-10-CM | POA: Diagnosis not present

## 2016-03-18 DIAGNOSIS — G500 Trigeminal neuralgia: Secondary | ICD-10-CM | POA: Diagnosis not present

## 2016-03-18 DIAGNOSIS — F329 Major depressive disorder, single episode, unspecified: Secondary | ICD-10-CM | POA: Diagnosis not present

## 2016-04-08 ENCOUNTER — Telehealth: Payer: Self-pay | Admitting: Cardiovascular Disease

## 2016-04-08 MED ORDER — LISINOPRIL 20 MG PO TABS: 20.0000 mg | ORAL_TABLET | Freq: Every day | ORAL | 0 refills | Status: DC

## 2016-04-08 NOTE — Telephone Encounter (Signed)
New message        *STAT* If patient is at the pharmacy, call can be transferred to refill team.   1. Which medications need to be refilled? (please list name of each medication and dose if known) lisinopril 20mg  2. Which pharmacy/location (including street and city if local pharmacy) [facility] [address] Do they need a 30 day or 90 day supply? 90 day supply

## 2016-04-08 NOTE — Telephone Encounter (Signed)
Rx has been sent to the pharmacy electronically. ° °

## 2016-04-17 ENCOUNTER — Encounter: Payer: Self-pay | Admitting: Neurology

## 2016-04-17 ENCOUNTER — Ambulatory Visit (INDEPENDENT_AMBULATORY_CARE_PROVIDER_SITE_OTHER): Payer: Medicare Other | Admitting: Neurology

## 2016-04-17 VITALS — BP 100/70 | HR 79 | Ht 69.0 in | Wt 199.1 lb

## 2016-04-17 DIAGNOSIS — G500 Trigeminal neuralgia: Secondary | ICD-10-CM

## 2016-04-17 DIAGNOSIS — R6884 Jaw pain: Secondary | ICD-10-CM

## 2016-04-17 DIAGNOSIS — G250 Essential tremor: Secondary | ICD-10-CM | POA: Diagnosis not present

## 2016-04-17 MED ORDER — PROPRANOLOL HCL ER 120 MG PO CP24: 120.0000 mg | ORAL_CAPSULE | Freq: Every day | ORAL | 3 refills | Status: DC

## 2016-04-17 NOTE — Progress Notes (Signed)
Follow-up Visit   Date: 04/17/16    Gabriel Richardson MRN: [id number] DOB: [birth year]   Interim History: Gabriel Richardson is a 70 y.o. right-handed Caucasian male with history of depression, asthma, hypertension, and intention tremor returning to the clinic with new complaints of right trigeminal neuralgia.  The patient was accompanied to the clinic by self.  History of present illness: Initial visit 04/30/2013:  He had previously been a patient of Dr. [name] who he had been seeing since 1995. He initially saw Dr. [name] for right trigeminal neuralgia which he had frequent flares of since 1997 - early 2000. He had previously tried trileptal, neurontin, and lyrica and had the most relief with Lyrica. He has not had a flare in the past 14 years.  Around 2012, he started developing muscle cramps and left hand tremors and was concerned about multiple sclerosis. He had an evaluation including imaging. Although there was white matter changes on his MRI, Dr. [name] did not find that his exam and imaging was consistent with demyelinating disease. He also had CSF testing in January 2014 which did not show any inflammatory changes. He treated him symptomatically and was started on propranolol 60mg  which has signifcantly helped with his tremors. He takes robaxin 500mg  twice daily as needed which also resolves his muscle cramps.  He used to have occasionally has bilateral painful hand cramps and severe neck pain. He underwent anterior cervical fusion by Dr. [name] in 2010 which totally resolved his symptoms. He has also has history of left L5 radiculopathy status post L5-S1 laminectomy.  More recently, he saw Dr. [name] in July for new left ptosis. Myasthenia antibodies were checked and was normal. He declined to have additional testing done to evaluate this.  UPDATE 07/31/2015:  Late 2016, he began experiencing right facial shooting paresthesias over the upper and lower lip especially with light tactile  stimuli, chewing, brushing, and talking. He has exquisite sensitivity over the right upper lip.  He denies painful paresthesias over the forehead and cheek.  Denies any changes in taste or facial weakness.  His hand tremors are well-controlled on inderal 60mg  and he has noticed that it is improved with alcohol.  His cramps are nearly resolved with robaxin.  He has chronic low back pain and sees Dr. [name] for this and have been contemplating surgery.  He takes tramadol as needed.   UPDATE 12/26/2015:  He saw Dr. [name] for low back pain follow-up, but also discussed his trigeminal neuralgia who states that he is able to microvascular decompression surgery for this, if medications are ineffective.  He currently takes Lyrica 100mg  twice daily, because he is unable to tolerate extra dose in the afternoon (blurry vision, sedation).  He has previously tried neurontin and tegretol.    UPDATE 04/17/2016:   He is here for follow-up appointment.  At his last visit, I recommended restarting Lyrica, but he could not tolerate Lyrica at 50mg  because of blurred vision and drowsiness so discontinued it.  Despite being off medication for this, his severe lancinating pain of the cheek and jaw has resolved, but he has new throbbing pain in the right lower jaw.  He saw his dentist, endodontist, oral surgeon who thinks his pain may be due to trigeminal neuralgia.  He achy pain feels like a tooth abscess.  It is worse with chewing and activity, but does not have electrical, radiating pain like he does with trigeminal neuralgia.  This jaw pain is very different from the  hypersensitivity and neuropathic pain with trigeminal neualgia.  He takes hydrocodone and tylenol which alleviates the pain.  He does report having some dental intervention to one of the teeth, which alleviated the pain but continues to have the same pain involving two neighboring teeth.  He also complains of bilateral hand tremor which is worse.  It was well  controlled on propranolol 60mg    Medications:  Current Outpatient Prescriptions on File Prior to Visit  Medication Sig Dispense Refill  . ALPRAZolam (XANAX) 0.5 MG tablet Take 0.5 mg by mouth daily as needed.  5  . ARIPiprazole (ABILIFY) 2 MG tablet Take 2 mg by mouth every morning.  4  . HYDROcodone-acetaminophen (NORCO) 5-325 MG tablet Take 1 tablet by mouth every 6 (six) hours as needed for moderate pain. 20 tablet 0  . lamoTRIgine (LAMICTAL) 200 MG tablet Take 200 mg by mouth at bedtime.  1  . lisinopril (PRINIVIL,ZESTRIL) 20 MG tablet Take 1 tablet (20 mg total) by mouth daily. Need appointment before further refill 90 tablet 0  . methocarbamol (ROBAXIN) 500 MG tablet 1 tablet twice a day 60 tablet 5  . sildenafil (VIAGRA) 100 MG tablet Take 1 tablet (100 mg total) by mouth daily as needed for erectile dysfunction. 12 tablet 11  . traMADol (ULTRAM) 50 MG tablet Take 50 mg by mouth 2 (two) times daily as needed.    Marland Kitchen VIAGRA 100 MG tablet Take 100 mg by mouth as needed.  3  . zolpidem (AMBIEN) 10 MG tablet Take 10 mg by mouth at bedtime.  1   No current facility-administered medications on file prior to visit.     Allergies: No Known Allergies  Review of Systems:  CONSTITUTIONAL: No fevers, chills, night sweats, or weight loss.  EYES: No visual changes or eye pain ENT: No hearing changes.  No history of nose bleeds.   RESPIRATORY: No cough, wheezing and shortness of breath.   CARDIOVASCULAR: Negative for chest pain, and palpitations.   GI: Negative for abdominal discomfort, blood in stools or black stools.  No recent change in bowel habits.   GU:  No history of incontinence.   MUSCLOSKELETAL: No history of joint pain or swelling.  No myalgias.   SKIN: Negative for lesions, rash, and itching.   ENDOCRINE: Negative for cold or heat intolerance, polydipsia or goiter.   PSYCH:  No depression or anxiety symptoms.   NEURO: As Above.   Vital Signs:  BP 100/70   Pulse 79   Ht 5\' 9"   (1.753 m)   Wt 199 lb 1 oz (90.3 kg)   SpO2 99%   BMI 29.40 kg/m   Neurological Exam: MENTAL STATUS including orientation to time, place, person, recent and remote memory, attention span and concentration, language, and fund of knowledge is normal.  Speech is not dysarthric.  CRANIAL NERVES:  Pupils equal round and reactive to light.  Normal conjugate, extra-ocular eye movements in all directions of gaze.  Mild left ptosis (old). Normal facial sensation over right V1, there is hyperesthesias to temperature and pin prick over the right V2-3.  Face is symmetric. Palate elevates symmetrically.  Tongue is midline.  MOTOR:  Motor strength is 5/5 in all extremities, except left toe extension is 4+/5.  Very mild right postural tremor.     MSRs:  Reflexes are 2+/4 throughout.  SENSORY:  Vibration intact throughout.   COORDINATION/GAIT:    Gait narrow based and stable, mildly reduced arm swing bilaterally.   Data: AChR blocking and  binding 11/02/2012 - neg  CSF 06/02/2012: R0 W1 G63 P43 IgG index 0.43 OCB -neg  MRI brain wwo contrast 07/08/2009: 1. Advanced white matter disease in a pattern compatible with multiple sclerosis. Mild progression since 2004. No areas of  enhancement.  2. No acute intracranial abnormality.  MRI cervical spine 09/02/2008: 1. Moderate central and bilateral foraminal stenosis at C4-5. This is the worst level.  2. Moderate left and mild right foraminal narrowing at C5-6.  3. Asymmetric right disc osteophyte complex at C6-7 without significant stenosis.  4. Mild to moderate right foraminal stenosis at C3-4.  5. Mild central canal stenosis at C3-4.  Lab Results  Component Value Date   TSH 0.738 01/05/2015    IMPRESSION/PLAN: 1. Right trigeminal neuralgia is in remission.  He complains of right achy-nonradiating jaw/tooth pain and which is not characteristic or related to trigeminal neuralgia and more suggestive of primary dental issue.    -  Previously tried gabapentin, tegretol, Lyrica  - He already takes lamictal 200mg  at bedtime as a mood stabilizer  - Currently, he is not taking any medication specific for his trigeminal neuralgia and doing well.  2.  Benign essential tremor of the hands, worsening  - Increase propranolol to 120 mg daily   3. Muscle cramps - resolved  4. Left ptosis - Likely an incidental finding and old - No features on examination to suggest myasthenia (AChR binding and blocking antibodies are negative), Horner's syndrome, or CN III palsy  Return to clinic as needed   The duration of this appointment visit was 25 minutes of face-to-face time with the patient.  Greater than 50% of this time was spent in counseling, explanation of diagnosis, planning of further management, and coordination of care.   Thank you for allowing me to participate in patient's care.  If I can answer any additional questions, I would be pleased to do so.    Sincerely,    Donika K. Posey Pronto, DO

## 2016-04-17 NOTE — Patient Instructions (Addendum)
1.  Increase propranolol 120mg  daily for tremors  2.  Right trigeminal neuralgia is in remission. Right jaw/tooth pain is not characteristic or related to trigeminal neuralgia and more suggestive of primary dental issue.    Return to clinic as needed

## 2016-05-01 DIAGNOSIS — M5136 Other intervertebral disc degeneration, lumbar region: Secondary | ICD-10-CM | POA: Diagnosis not present

## 2016-05-01 DIAGNOSIS — M48062 Spinal stenosis, lumbar region with neurogenic claudication: Secondary | ICD-10-CM | POA: Diagnosis not present

## 2016-05-01 DIAGNOSIS — M5416 Radiculopathy, lumbar region: Secondary | ICD-10-CM | POA: Diagnosis not present

## 2016-05-01 DIAGNOSIS — M5440 Lumbago with sciatica, unspecified side: Secondary | ICD-10-CM | POA: Diagnosis not present

## 2016-05-01 DIAGNOSIS — M4310 Spondylolisthesis, site unspecified: Secondary | ICD-10-CM | POA: Diagnosis not present

## 2016-05-01 DIAGNOSIS — M4726 Other spondylosis with radiculopathy, lumbar region: Secondary | ICD-10-CM | POA: Diagnosis not present

## 2016-05-08 ENCOUNTER — Ambulatory Visit: Payer: Medicare Other | Admitting: Diagnostic Neuroimaging

## 2016-05-31 DIAGNOSIS — M4310 Spondylolisthesis, site unspecified: Secondary | ICD-10-CM | POA: Diagnosis not present

## 2016-05-31 DIAGNOSIS — M48062 Spinal stenosis, lumbar region with neurogenic claudication: Secondary | ICD-10-CM | POA: Diagnosis not present

## 2016-05-31 DIAGNOSIS — M5416 Radiculopathy, lumbar region: Secondary | ICD-10-CM | POA: Diagnosis not present

## 2016-05-31 DIAGNOSIS — M4726 Other spondylosis with radiculopathy, lumbar region: Secondary | ICD-10-CM | POA: Diagnosis not present

## 2016-05-31 DIAGNOSIS — M5136 Other intervertebral disc degeneration, lumbar region: Secondary | ICD-10-CM | POA: Diagnosis not present

## 2016-05-31 DIAGNOSIS — M5440 Lumbago with sciatica, unspecified side: Secondary | ICD-10-CM | POA: Diagnosis not present

## 2016-07-01 ENCOUNTER — Ambulatory Visit: Payer: Medicare Other | Admitting: Neurology

## 2016-07-04 ENCOUNTER — Other Ambulatory Visit: Payer: Self-pay | Admitting: Cardiovascular Disease

## 2016-07-04 NOTE — Telephone Encounter (Signed)
Refill request

## 2016-07-04 NOTE — Telephone Encounter (Signed)
Rx(s) sent to pharmacy electronically.  

## 2016-07-11 DIAGNOSIS — M48061 Spinal stenosis, lumbar region without neurogenic claudication: Secondary | ICD-10-CM | POA: Diagnosis not present

## 2016-07-11 DIAGNOSIS — M4726 Other spondylosis with radiculopathy, lumbar region: Secondary | ICD-10-CM | POA: Diagnosis not present

## 2016-07-11 DIAGNOSIS — M5116 Intervertebral disc disorders with radiculopathy, lumbar region: Secondary | ICD-10-CM | POA: Diagnosis not present

## 2016-07-12 DIAGNOSIS — G250 Essential tremor: Secondary | ICD-10-CM | POA: Diagnosis not present

## 2016-07-12 DIAGNOSIS — J449 Chronic obstructive pulmonary disease, unspecified: Secondary | ICD-10-CM | POA: Diagnosis not present

## 2016-07-12 DIAGNOSIS — J45909 Unspecified asthma, uncomplicated: Secondary | ICD-10-CM | POA: Diagnosis not present

## 2016-07-12 DIAGNOSIS — R0789 Other chest pain: Secondary | ICD-10-CM | POA: Diagnosis not present

## 2016-07-16 ENCOUNTER — Telehealth: Payer: Self-pay | Admitting: Neurology

## 2016-07-16 ENCOUNTER — Other Ambulatory Visit: Payer: Self-pay | Admitting: *Deleted

## 2016-07-16 ENCOUNTER — Telehealth: Payer: Self-pay | Admitting: Cardiovascular Disease

## 2016-07-16 MED ORDER — PROPRANOLOL HCL ER 160 MG PO CP24: 160.0000 mg | ORAL_CAPSULE | Freq: Every day | ORAL | 3 refills | Status: DC

## 2016-07-16 NOTE — Telephone Encounter (Signed)
I spoke with patient and informed him that Dr. Posey Pronto would like to increase his medication to 160 mg qd.  Patient agreed with plan. Rx sent.

## 2016-07-16 NOTE — Telephone Encounter (Signed)
Gabriel Richardson 03/27/2046. He was calling regarding his hand tremors. He said they are much worse. He is having a hard time eating because of them. His # is [id number]. Doctor Posey Pronto is booked out until May as of now. He would like you to please call him. Thank you

## 2016-07-16 NOTE — Telephone Encounter (Signed)
07/16/2016 Received faxed referral packet from [place] for upcoming appointment with Dr. Oval Linsey on 07/19/2016.  Records given to [facility].  cbr

## 2016-07-19 ENCOUNTER — Ambulatory Visit (INDEPENDENT_AMBULATORY_CARE_PROVIDER_SITE_OTHER): Payer: Medicare Other | Admitting: Cardiovascular Disease

## 2016-07-19 ENCOUNTER — Encounter: Payer: Self-pay | Admitting: Cardiovascular Disease

## 2016-07-19 ENCOUNTER — Telehealth: Payer: Self-pay | Admitting: Neurology

## 2016-07-19 VITALS — BP 131/82 | HR 82 | Ht 69.0 in | Wt 200.0 lb

## 2016-07-19 DIAGNOSIS — I10 Essential (primary) hypertension: Secondary | ICD-10-CM | POA: Diagnosis not present

## 2016-07-19 NOTE — Progress Notes (Signed)
Cardiology Office Note   Date:  07/19/2016   ID:  Gabriel Richardson, DOB [birth year], MRN [id number]  PCP:  Anthoney Harada, MD  Cardiologist:   [name], MD   Chief Complaint  Patient presents with  . Follow-up    pt wonders if he needs to take Lisinopril since he rarely takes his Viagra       History of Present Illness: Gabriel Richardson is a 71 y.o. male with hypertension, asthma, and biplar depression who presents for an evaluation of abnormal EKG.  Gabriel Richardson saw Dr. Everlene Farrier in August and was noted to have inferior Q waves.  At the time he denied any chest pain but was requesting a refill of his Viagra. Dr. Everlene Farrier was concerned about refilling this without a cardiac assessment. Therefore he was referred to cardiology for evaluation. He had no symptoms of ischemia. At that appointment his blood pressure was noted to be elevated and he was started on lisinopril. However, he notes that his blood pressure has been stable around 130/80 whether he takes the lisinopril or not. He wonders if he needs to continue taking this medication. A few Harada ago he did have an episode of chest discomfort. He discussed this with Dr. Jacelyn Grip and we both felt it was likely musculoskeletal. He had a steroid injection last week and has not experienced any recurrent chest pain since that time. He denies any shortness of breath, lower extremity edema, orthopnea, or PND. He hasn't been getting much exercise lately because of his back pain but this has improved any. Start back exercising soon.   Past Medical History:  Diagnosis Date  . Anxiety   . Asthma   . Cervical stenosis of spinal canal   . COPD (chronic obstructive pulmonary disease) ([place])   . Depression   . Emphysema of lung ([place])   . Neuromuscular disorder [facility])     Past Surgical History:  Procedure Laterality Date  . CERVICAL FUSION    . LUMBAR LAMINECTOMY    . SPINE SURGERY     Cervical C3-C5 anterior fusion     Current Outpatient  Prescriptions  Medication Sig Dispense Refill  . ALPRAZolam (XANAX) 0.5 MG tablet Take 0.5 mg by mouth daily as needed.  5  . ARIPiprazole (ABILIFY) 2 MG tablet Take 2 mg by mouth every morning.  4  . lamoTRIgine (LAMICTAL) 200 MG tablet Take 200 mg by mouth at bedtime.  1  . methocarbamol (ROBAXIN) 500 MG tablet 1 tablet twice a day 60 tablet 5  . propranolol ER (INDERAL LA) 160 MG SR capsule Take 1 capsule (160 mg total) by mouth daily. 30 capsule 3  . sildenafil (VIAGRA) 100 MG tablet Take 1 tablet (100 mg total) by mouth daily as needed for erectile dysfunction. 12 tablet 11  . traMADol (ULTRAM) 50 MG tablet Take 50 mg by mouth 2 (two) times daily as needed.    . zolpidem (AMBIEN) 10 MG tablet Take 10 mg by mouth at bedtime.  1   No current facility-administered medications for this visit.     Allergies:   Patient has no known allergies.    Social History:  The patient  reports that he quit smoking about 19 years ago. His smoking use included Cigarettes. He has a 37.50 pack-year smoking history. He has never used smokeless tobacco. He reports that he drinks about 1.2 oz of alcohol per week . He reports that he does not use drugs.   Family History:  The patient's family history includes Breast cancer in his mother; Depression in his daughter; Diabetes Mellitus II in his father; Healthy in his brother; Kidney disease in his mother; Tremor in his father.    ROS:  Please see the history of present illness.   Otherwise, review of systems are positive for none.   All other systems are reviewed and negative.    PHYSICAL EXAM: VS:  BP 131/82 (BP Location: Right Arm, Patient Position: Sitting, Cuff Size: Large)   Pulse 82   Ht 5\' 9"  (1.753 m)   Wt 90.7 kg (200 lb)   SpO2 97%   BMI 29.53 kg/m  , BMI Body mass index is 29.53 kg/m. GENERAL:  Well appearing HEENT:  Pupils equal round and reactive, fundi not visualized, oral mucosa unremarkable NECK:  No jugular venous distention, waveform  within normal limits, carotid upstroke brisk and symmetric, no bruits, no thyromegaly LYMPHATICS:  No cervical adenopathy LUNGS:  Clear to auscultation bilaterally HEART:  RRR.  PMI not displaced or sustained,S1 and S2 within normal limits, no S3, no S4, no clicks, no rubs, no murmurs ABD:  Flat, positive bowel sounds normal in frequency in pitch, no bruits, no rebound, no guarding, no midline pulsatile mass, no hepatomegaly, no splenomegaly EXT:  2 plus pulses throughout, no edema, no cyanosis no clubbing SKIN:  No rashes no nodules NEURO:  Cranial nerves II through XII grossly intact, motor grossly intact throughout PSYCH:  Cognitively intact, oriented to person place and time   EKG:  EKG is not ordered today. 03/03/15: Sinus rhythm at 78 bmp.  LVH.   Recent Labs: No results found for requested labs within last 8760 hours.    Lipid Panel    Component Value Date/Time   CHOL 146 08/13/2013 0905   TRIG 103 08/13/2013 0905   HDL 50 08/13/2013 0905   CHOLHDL 2.9 08/13/2013 0905   VLDL 21 08/13/2013 0905   LDLCALC 75 08/13/2013 0905      Wt Readings from Last 3 Encounters:  07/19/16 90.7 kg (200 lb)  04/17/16 90.3 kg (199 lb 1 oz)  12/26/15 90.9 kg (200 lb 8 oz)      ASSESSMENT AND PLAN:  # Hypertension: BP stable.  Goal is <130/he reports that it has been well-controlled at home. Today it was mildly elevated. We will continue propranolol. He would like to discontinue lisinopril, which I think is reasonable. I have encouraged him to start back exercising and limit his salt intake, which should also help with his blood pressure.  # CV Disease Prevention: He reports that his lipids were checked by his PCP recently and well-controlled.   Current medicines are reviewed at length with the patient today.  The patient does not have concerns regarding medicines.  The following changes have been made:  none  Labs/ tests ordered today include:   No orders of the defined types were  placed in this encounter.    Disposition:   FU with Silvia Markuson C. Oval Linsey, MD prn   Signed, [name], MD  07/19/2016 4:50 PM    [place]

## 2016-07-19 NOTE — Patient Instructions (Signed)
Please follow up with us as needed

## 2016-07-19 NOTE — Telephone Encounter (Signed)
Did you call him?

## 2016-07-19 NOTE — Telephone Encounter (Signed)
PT called and said he received a call this morning and was returning the call/Dawn CB# [phone]

## 2016-07-22 NOTE — Addendum Note (Signed)
Addended by: Alvina Filbert B on: 07/22/2016 09:48 AM   Modules accepted: Orders

## 2016-07-25 DIAGNOSIS — F319 Bipolar disorder, unspecified: Secondary | ICD-10-CM | POA: Diagnosis not present

## 2016-07-26 NOTE — Telephone Encounter (Signed)
Completed.

## 2016-07-29 ENCOUNTER — Ambulatory Visit (INDEPENDENT_AMBULATORY_CARE_PROVIDER_SITE_OTHER): Payer: Medicare Other | Admitting: Neurology

## 2016-07-29 ENCOUNTER — Encounter: Payer: Self-pay | Admitting: Neurology

## 2016-07-29 VITALS — BP 148/84 | HR 72 | Ht 69.0 in | Wt 196.5 lb

## 2016-07-29 DIAGNOSIS — G250 Essential tremor: Secondary | ICD-10-CM | POA: Diagnosis not present

## 2016-07-29 DIAGNOSIS — R252 Cramp and spasm: Secondary | ICD-10-CM | POA: Diagnosis not present

## 2016-07-29 MED ORDER — PROPRANOLOL HCL 40 MG PO TABS: 40.0000 mg | ORAL_TABLET | Freq: Every day | ORAL | 5 refills | Status: DC | PRN

## 2016-07-29 MED ORDER — PROPRANOLOL HCL ER 160 MG PO CP24: 160.0000 mg | ORAL_CAPSULE | Freq: Every day | ORAL | 3 refills | Status: DC

## 2016-07-29 NOTE — Progress Notes (Signed)
Follow-up Visit   Date: 07/29/16    Gabriel Richardson MRN: [id number] DOB: 03-19-1946   Interim History: Gabriel Richardson is a 71 y.o. right-handed Caucasian male with history of depression, asthma, hypertension, and intention tremor returning to the clinic with new complaints of right trigeminal neuralgia.  The patient was accompanied to the clinic by self.  History of present illness: Initial visit 04/30/2013:  He had previously been a patient of Dr. [name] who he had been seeing since 1995. He initially saw Dr. [name] for right trigeminal neuralgia which he had frequent flares of since 1997 - early 2000. He had previously tried trileptal, neurontin, and lyrica and had the most relief with Lyrica. He has not had a flare in the past 14 years.  Around 2012, he started developing muscle cramps and left hand tremors and was concerned about multiple sclerosis. He had an evaluation including imaging. Although there was white matter changes on his MRI, Dr. [name] did not find that his exam and imaging was consistent with demyelinating disease. He also had CSF testing in January 2014 which did not show any inflammatory changes. He treated him symptomatically and was started on propranolol 60mg  which has signifcantly helped with his tremors. He takes robaxin 500mg  twice daily as needed which also resolves his muscle cramps.  He used to have occasionally has bilateral painful hand cramps and severe neck pain. He underwent anterior cervical fusion by Dr. [name] in 2010 which totally resolved his symptoms. He has also has history of left L5 radiculopathy status post L5-S1 laminectomy.  He saw Dr. [name] in July 2016 for new left ptosis. Myasthenia antibodies were checked and was normal. He declined to have additional testing done to evaluate this.  UPDATE 07/31/2015:  Late 2016, he began experiencing right facial shooting paresthesias over the upper and lower lip especially with light tactile stimuli,  chewing, brushing, and talking. He has exquisite sensitivity over the right upper lip.  He denies painful paresthesias over the forehead and cheek.  Denies any changes in taste or facial weakness.  His hand tremors are well-controlled on inderal 60mg  and he has noticed that it is improved with alcohol.  His cramps are nearly resolved with robaxin.  He has chronic low back pain and sees Dr. [name] for this and have been contemplating surgery.  He takes tramadol as needed.   UPDATE 12/26/2015:  He saw Dr. [name] for low back pain follow-up, but also discussed his trigeminal neuralgia who states that he is able to microvascular decompression surgery for this, if medications are ineffective.  He currently takes Lyrica 100mg  twice daily, because he is unable to tolerate extra dose in the afternoon (blurry vision, sedation).  He has previously tried neurontin and tegretol.    UPDATE 04/17/2016:   He is here for follow-up appointment.  At his last visit, I recommended restarting Lyrica, but he could not tolerate Lyrica at 50mg  because of blurred vision and drowsiness so discontinued it.  Despite being off medication for this, his severe lancinating pain of the cheek and jaw has resolved, but he has new throbbing pain in the right lower jaw.  He saw his dentist, endodontist, oral surgeon who thinks his pain may be due to trigeminal neuralgia.  He achy pain feels like a tooth abscess.  It is worse with chewing and activity, but does not have electrical, radiating pain like he does with trigeminal neuralgia.  This jaw pain is very different from the hypersensitivity  and neuropathic pain with trigeminal neualgia.  He takes hydrocodone and tylenol which alleviates the pain.  He does report having some dental intervention to one of the teeth, which alleviated the pain but continues to have the same pain involving two neighboring teeth.  He also complains of bilateral hand tremor which is worse.  It was well controlled  on propranolol 60mg    UPDATE 07/29/2016:  He is seen in follow-up today for tremors.  He noticed that tremors were better controlled on 160mg  daily, but sometimes has spells where they can be worse and interfere with fine motor tasks, such as having dinner which can cause social embarrassment. They also tend to be worse in the morning, before he takes his tablet. No new complaints with respect to his trigeminal neuralgia, he continues to have achy pain over the right lower jaw and is seeing his dentist for this.  He also complains of muscle cramps involving the left hand and lower legs which occurs at night and can be painful.  He drinks plenty of water.     Medications:  Current Outpatient Prescriptions on File Prior to Visit  Medication Sig Dispense Refill  . ALPRAZolam (XANAX) 0.5 MG tablet Take 0.5 mg by mouth daily as needed.  5  . ARIPiprazole (ABILIFY) 2 MG tablet Take 2 mg by mouth every morning.  4  . lamoTRIgine (LAMICTAL) 200 MG tablet Take 200 mg by mouth at bedtime.  1  . methocarbamol (ROBAXIN) 500 MG tablet 1 tablet twice a day 60 tablet 5  . sildenafil (VIAGRA) 100 MG tablet Take 1 tablet (100 mg total) by mouth daily as needed for erectile dysfunction. 12 tablet 11  . traMADol (ULTRAM) 50 MG tablet Take 50 mg by mouth 2 (two) times daily as needed.    . zolpidem (AMBIEN) 10 MG tablet Take 10 mg by mouth at bedtime.  1   No current facility-administered medications on file prior to visit.     Allergies: No Known Allergies  Review of Systems:  CONSTITUTIONAL: No fevers, chills, night sweats, or weight loss.  EYES: No visual changes or eye pain ENT: No hearing changes.  No history of nose bleeds.   RESPIRATORY: No cough, wheezing and shortness of breath.   CARDIOVASCULAR: Negative for chest pain, and palpitations.   GI: Negative for abdominal discomfort, blood in stools or black stools.  No recent change in bowel habits.   GU:  No history of incontinence.   MUSCLOSKELETAL: No  history of joint pain or swelling.  No myalgias.   SKIN: Negative for lesions, rash, and itching.   ENDOCRINE: Negative for cold or heat intolerance, polydipsia or goiter.   PSYCH:  No depression or anxiety symptoms.   NEURO: As Above.   Vital Signs:  BP (!) 148/84   Pulse 72   Ht 5\' 9"  (1.753 m)   Wt 196 lb 8 oz (89.1 kg)   SpO2 98%   BMI 29.02 kg/m   Neurological Exam: MENTAL STATUS including orientation to time, place, person, recent and remote memory, attention span and concentration, language, and fund of knowledge is normal.  Speech is not dysarthric.  CRANIAL NERVES:  Pupils equal round and reactive to light.  Normal conjugate, extra-ocular eye movements in all directions of gaze.  Mild left ptosis (old). Normal facial sensation.  Face is symmetric. Palate elevates symmetrically.  Tongue is midline.  MOTOR:  Motor strength is 5/5 in all extremities.  Mild right postural tremor, worse at end-point, absent  at rest.     COORDINATION/GAIT:    Gait narrow based and stable, mildly reduced arm swing bilaterally.   Data: AChR blocking and binding 11/02/2012 - neg  CSF 06/02/2012: R0 W1 G63 P43 IgG index 0.43 OCB -neg  MRI brain wwo contrast 07/08/2009: 1. Advanced white matter disease in a pattern compatible with multiple sclerosis. Mild progression since 2004. No areas of  enhancement.  2. No acute intracranial abnormality.  MRI cervical spine 09/02/2008: 1. Moderate central and bilateral foraminal stenosis at C4-5. This is the worst level.  2. Moderate left and mild right foraminal narrowing at C5-6.  3. Asymmetric right disc osteophyte complex at C6-7 without significant stenosis.  4. Mild to moderate right foraminal stenosis at C3-4.  5. Mild central canal stenosis at C3-4.  Lab Results  Component Value Date   TSH 0.738 01/05/2015    IMPRESSION/PLAN: 1. Benign essential tremor of the hands, stable with intermittent spells of worsening tremors especially  under stress.  He responds well to propranolol which has been titrated over the years.  - Continue propranolol LA 160 mg daily  - Start propranolol 40mg  IR to take as needed for severe tremor  2.  Right trigeminal neuralgia is in remission.    - Previously tried gabapentin, tegretol, Lyrica  - He already takes lamictal 200mg  at bedtime as a mood stabilizer  - Currently, he is not taking any medication specific for his trigeminal neuralgia and doing well.  3. Nocturnal muscle cramps   - Drink 2-3 glasses of tonic water at bedtime  - Encouraged to do leg stretches  4. Left ptosis, incidental finding and old - No features on examination to suggest myasthenia (AChR binding and blocking antibodies are negative), Horner's syndrome, or CN III palsy  Return to clinic in 6 months   The duration of this appointment visit was 25 minutes of face-to-face time with the patient.  Greater than 50% of this time was spent in counseling, explanation of diagnosis, planning of further management, and coordination of care.   Thank you for allowing me to participate in patient's care.  If I can answer any additional questions, I would be pleased to do so.    Sincerely,    Sinia Antosh K. Posey Pronto, DO

## 2016-07-29 NOTE — Progress Notes (Signed)
Note routed

## 2016-07-29 NOTE — Patient Instructions (Signed)
1.  OK to take propranolol 40mg  immediate release to use as needed for severe tremors 2.  Continue propranolol 160mg  daily 3.  Start drinking 2-3 glasses of tonic water at bedtime to help with cramps  Return 6 months

## 2016-08-22 DIAGNOSIS — Z1159 Encounter for screening for other viral diseases: Secondary | ICD-10-CM | POA: Diagnosis not present

## 2016-08-22 DIAGNOSIS — L03032 Cellulitis of left toe: Secondary | ICD-10-CM | POA: Diagnosis not present

## 2016-08-22 DIAGNOSIS — Z136 Encounter for screening for cardiovascular disorders: Secondary | ICD-10-CM | POA: Diagnosis not present

## 2016-08-22 DIAGNOSIS — Z5181 Encounter for therapeutic drug level monitoring: Secondary | ICD-10-CM | POA: Diagnosis not present

## 2016-08-22 DIAGNOSIS — Z0000 Encounter for general adult medical examination without abnormal findings: Secondary | ICD-10-CM | POA: Diagnosis not present

## 2016-08-22 DIAGNOSIS — F329 Major depressive disorder, single episode, unspecified: Secondary | ICD-10-CM | POA: Diagnosis not present

## 2016-08-22 DIAGNOSIS — Z125 Encounter for screening for malignant neoplasm of prostate: Secondary | ICD-10-CM | POA: Diagnosis not present

## 2016-08-22 DIAGNOSIS — Z13220 Encounter for screening for lipoid disorders: Secondary | ICD-10-CM | POA: Diagnosis not present

## 2016-08-22 DIAGNOSIS — J449 Chronic obstructive pulmonary disease, unspecified: Secondary | ICD-10-CM | POA: Diagnosis not present

## 2016-08-22 DIAGNOSIS — G500 Trigeminal neuralgia: Secondary | ICD-10-CM | POA: Diagnosis not present

## 2016-08-27 ENCOUNTER — Emergency Department (HOSPITAL_BASED_OUTPATIENT_CLINIC_OR_DEPARTMENT_OTHER)
Admission: EM | Admit: 2016-08-27 | Discharge: 2016-08-27 | Disposition: A | Discharge status: Home / Self Care | Payer: Medicare Other | Attending: Emergency Medicine | Admitting: Emergency Medicine

## 2016-08-27 ENCOUNTER — Emergency Department (HOSPITAL_BASED_OUTPATIENT_CLINIC_OR_DEPARTMENT_OTHER): Payer: Medicare Other

## 2016-08-27 ENCOUNTER — Encounter (HOSPITAL_BASED_OUTPATIENT_CLINIC_OR_DEPARTMENT_OTHER): Payer: Self-pay | Admitting: *Deleted

## 2016-08-27 DIAGNOSIS — R42 Dizziness and giddiness: Secondary | ICD-10-CM | POA: Diagnosis not present

## 2016-08-27 DIAGNOSIS — I10 Essential (primary) hypertension: Secondary | ICD-10-CM | POA: Insufficient documentation

## 2016-08-27 DIAGNOSIS — Z87891 Personal history of nicotine dependence: Secondary | ICD-10-CM | POA: Diagnosis not present

## 2016-08-27 DIAGNOSIS — R51 Headache: Secondary | ICD-10-CM | POA: Diagnosis not present

## 2016-08-27 DIAGNOSIS — J45909 Unspecified asthma, uncomplicated: Secondary | ICD-10-CM | POA: Diagnosis not present

## 2016-08-27 DIAGNOSIS — Z79899 Other long term (current) drug therapy: Secondary | ICD-10-CM | POA: Diagnosis not present

## 2016-08-27 DIAGNOSIS — J449 Chronic obstructive pulmonary disease, unspecified: Secondary | ICD-10-CM | POA: Diagnosis not present

## 2016-08-27 LAB — CBC WITH DIFFERENTIAL/PLATELET
BASOS ABS: 0 10*3/uL (ref 0.0–0.1)
BASOS PCT: 0 %
Eosinophils Absolute: 0.1 10*3/uL (ref 0.0–0.7)
Eosinophils Relative: 1 %
HEMATOCRIT: 42.7 % (ref 39.0–52.0)
Hemoglobin: 15.7 g/dL (ref 13.0–17.0)
Lymphocytes Relative: 35 %
Lymphs Abs: 2.5 10*3/uL (ref 0.7–4.0)
MCH: 34 pg (ref 26.0–34.0)
MCHC: 36.8 g/dL — AB (ref 30.0–36.0)
MCV: 92.4 fL (ref 78.0–100.0)
MONO ABS: 0.8 10*3/uL (ref 0.1–1.0)
MONOS PCT: 11 %
NEUTROS ABS: 3.9 10*3/uL (ref 1.7–7.7)
NEUTROS PCT: 53 %
PLATELETS: 163 10*3/uL (ref 150–400)
RBC: 4.62 MIL/uL (ref 4.22–5.81)
RDW: 13 % (ref 11.5–15.5)
WBC: 7.3 10*3/uL (ref 4.0–10.5)

## 2016-08-27 LAB — COMPREHENSIVE METABOLIC PANEL
ALK PHOS: 50 U/L (ref 38–126)
ALT: 27 U/L (ref 17–63)
ANION GAP: 11 (ref 5–15)
AST: 31 U/L (ref 15–41)
Albumin: 4.2 g/dL (ref 3.5–5.0)
BILIRUBIN TOTAL: 1.1 mg/dL (ref 0.3–1.2)
BUN: 11 mg/dL (ref 6–20)
CALCIUM: 8.9 mg/dL (ref 8.9–10.3)
CO2: 26 mmol/L (ref 22–32)
Chloride: 93 mmol/L — ABNORMAL LOW (ref 101–111)
Creatinine, Ser: 0.83 mg/dL (ref 0.61–1.24)
GFR calc non Af Amer: >60 mL/min (ref >60)
GLUCOSE: 95 mg/dL (ref 65–99)
Potassium: 4 mmol/L (ref 3.5–5.1)
Sodium: 130 mmol/L — ABNORMAL LOW (ref 135–145)
TOTAL PROTEIN: 7.8 g/dL (ref 6.5–8.1)

## 2016-08-27 MED ORDER — MECLIZINE HCL 25 MG PO TABS: 25.0000 mg | ORAL_TABLET | Freq: Three times a day (TID) | ORAL | 0 refills | Status: DC | PRN

## 2016-08-27 MED ORDER — SODIUM CHLORIDE 0.9 % IV BOLUS (SEPSIS)
1000.0000 mL | Freq: Once | INTRAVENOUS | Status: AC
  Administered 2016-08-27: 1000 mL via INTRAVENOUS

## 2016-08-27 MED ORDER — MECLIZINE HCL 25 MG PO TABS
25.0000 mg | ORAL_TABLET | Freq: Once | ORAL | Status: AC
  Administered 2016-08-27: 25 mg via ORAL
  Filled 2016-08-27: qty 1

## 2016-08-27 NOTE — ED Provider Notes (Signed)
[facility] Provider Note   CSN: [id number] Arrival date & time: 08/27/16  1232     History   Chief Complaint Chief Complaint  Patient presents with  . Dizziness    HPI Gabriel Richardson is a 71 y.o. male.  Patient is a 71 year old male with history of COPD, anxiety. He presents today for evaluation of dizziness. He reports a spinning sensation when he attempts to stand and walk. This is been persistent for the past 5 days. He reports headache. He denies any weakness or numbness of his arms or legs. He denies any injury or trauma. He does have some baseline ringing in his ears, however nothing that is new. He denies any hearing loss or visual disturbances.  He was sent here by his primary Dr. for further evaluation and rule out of CVA.   The history is provided by the patient.  Dizziness  Quality:  Head spinning and imbalance Severity:  Moderate Onset quality:  Sudden Duration:  5 days Timing:  Intermittent Progression:  Unchanged Chronicity:  New Context: head movement and standing up   Context: not with loss of consciousness   Relieved by:  Nothing Worsened by:  Movement, standing up, turning head and sitting upright Ineffective treatments:  Being still   Past Medical History:  Diagnosis Date  . Anxiety   . Asthma   . Cervical stenosis of spinal canal   . COPD (chronic obstructive pulmonary disease) ([place])   . Depression   . Emphysema of lung ([place])   . Neuromuscular disorder [facility])     Patient Active Problem List   Diagnosis Date Noted  . Right trigeminal neuralgia 07/31/2015  . Benign essential tremor 07/31/2015  . Ptosis 12/14/2012  . Tremor 12/14/2012  . Depression 11/02/2012  . Asthma, chronic 11/02/2012  . HYPERTENSION, BENIGN 01/15/2009  . ABNORMAL ELECTROCARDIOGRAM 01/12/2009    Past Surgical History:  Procedure Laterality Date  . CERVICAL FUSION    . LUMBAR LAMINECTOMY    . SPINE SURGERY     Cervical C3-C5 anterior fusion        Home Medications    Prior to Admission medications   Medication Sig Start Date End Date Taking? Authorizing Provider  ALPRAZolam [name]) 0.5 MG tablet Take 0.5 mg by mouth daily as needed. 01/18/15   Historical Provider, MD  ARIPiprazole (ABILIFY) 2 MG tablet Take 2 mg by mouth every morning. 07/05/15   Historical Provider, MD  lamoTRIgine (LAMICTAL) 200 MG tablet Take 200 mg by mouth at bedtime. 01/18/15   Historical Provider, MD  methocarbamol (ROBAXIN) 500 MG tablet 1 tablet twice a day 03/08/15   Darlyne Russian, MD  propranolol (INDERAL) 40 MG tablet Take 1 tablet (40 mg total) by mouth daily as needed (tremors). 07/29/16   Donika Keith Rake, DO  propranolol ER (INDERAL LA) 160 MG SR capsule Take 1 capsule (160 mg total) by mouth daily. 07/29/16   Donika Keith Rake, DO  sildenafil (VIAGRA) 100 MG tablet Take 1 tablet (100 mg total) by mouth daily as needed for erectile dysfunction. 03/08/15   Darlyne Russian, MD  traMADol (ULTRAM) 50 MG tablet Take 50 mg by mouth 2 (two) times daily as needed. 02/27/15   Historical Provider, MD  zolpidem (AMBIEN) 10 MG tablet Take 10 mg by mouth at bedtime. 07/19/15   Historical Provider, MD    Family History Family History  Problem Relation Age of Onset  . Kidney disease Mother   . Breast cancer Mother  Living, 84  . Diabetes Mellitus II Father     Deceased, 36  . Tremor Father   . Depression Daughter   . Healthy Brother     Social History Social History  Substance Use Topics  . Smoking status: Former Smoker    Packs/day: 1.50    Years: 25.00    Types: Cigarettes    Quit date: 05/20/1997  . Smokeless tobacco: Never Used  . Alcohol use 1.2 oz/week    2 Shots of liquor per week     Comment: 7-10 drinks     Allergies   Patient has no known allergies.   Review of Systems Review of Systems  Neurological: Positive for dizziness.  All other systems reviewed and are negative.    Physical Exam Updated Vital Signs BP (!) 126/96    Pulse 76   Temp 99.1 F (37.3 C) (Oral)   Resp 18   Ht 5\' 8"  (1.727 m)   Wt 200 lb (90.7 kg)   SpO2 99%   BMI 30.41 kg/m   Physical Exam  Constitutional: He is oriented to person, place, and time. He appears well-developed and well-nourished. No distress.  HENT:  Head: Normocephalic and atraumatic.  Mouth/Throat: Oropharynx is clear and moist.  Eyes: EOM are normal. Pupils are equal, round, and reactive to light.  Neck: Normal range of motion. Neck supple.  Cardiovascular: Normal rate and regular rhythm.  Exam reveals no friction rub.   No murmur heard. Pulmonary/Chest: Effort normal and breath sounds normal. No respiratory distress. He has no wheezes. He has no rales.  Abdominal: Soft. Bowel sounds are normal. He exhibits no distension. There is no tenderness.  Musculoskeletal: Normal range of motion. He exhibits no edema.  Neurological: He is alert and oriented to person, place, and time. No cranial nerve deficit. He exhibits normal muscle tone. Coordination normal.  There is a tremor noted when testing coordination, however neurologic exam is otherwise intact.  Skin: Skin is warm and dry. He is not diaphoretic.  Nursing note and vitals reviewed.    ED Treatments / Results  Labs (all labs ordered are listed, but only abnormal results are displayed) Labs Reviewed  COMPREHENSIVE METABOLIC PANEL  CBC WITH DIFFERENTIAL/PLATELET    EKG  EKG Interpretation  Date/Time:  Tuesday August 27 2016 13:17:46 EDT Ventricular Rate:  75 PR Interval:    QRS Duration: 104 QT Interval:  397 QTC Calculation: 444 R Axis:   32 Text Interpretation:  Sinus rhythm Inferior infarct, old Baseline wander in lead(s) V1 Confirmed by Delon Revelo  MD, Aithan Farrelly ([id number]) on 08/27/2016 2:24:01 PM       Radiology No results found.  Procedures Procedures (including critical care time)  Medications Ordered in ED Medications  sodium chloride 0.9 % bolus 1,000 mL (not administered)  meclizine (ANTIVERT)  tablet 25 mg (not administered)     Initial Impression / Assessment and Plan / ED Course  I have reviewed the triage vital signs and the nursing notes.  Pertinent labs & imaging results that were available during my care of the patient were reviewed by me and considered in my medical decision making (see chart for details).  Patient presents here at the request of his primary physician for evaluation of dizziness. He is describing dizziness that seems clinically like peripheral vertigo. It is described as a spinning sensation that is worse when he moves and changes position. He is feeling somewhat better after IV fluids and meclizine. Workup reveals no abnormality on his head  CT and laboratory studies and EKG are unremarkable. He will be discharged with meclizine and is to follow-up with his primary Dr.  Final Clinical Impressions(s) / ED Diagnoses   Final diagnoses:  None    New Prescriptions New Prescriptions   No medications on file     Veryl Speak, MD 08/27/16 1449

## 2016-08-27 NOTE — Discharge Instructions (Signed)
Meclizine as prescribed as needed for dizziness.  Follow-up with your primary Dr. if symptoms are not improving in the next 3-4 days, and return to the ER if symptoms significantly worsen or change.

## 2016-08-27 NOTE — ED Triage Notes (Signed)
Headache and dizziness x 5 days. Vomiting when he moves. He saw his MD last week and was told he probable is having mini strokes and sent him home.

## 2016-08-27 NOTE — ED Notes (Signed)
Pt. In no distress.Marland Kitchen Has been assessed by EDP DeLo and is Nero intact.

## 2016-09-06 ENCOUNTER — Telehealth: Payer: Self-pay | Admitting: Neurology

## 2016-09-06 NOTE — Telephone Encounter (Signed)
Patient said that he is still having tremors and would like to know if we can increase medication dose?  (propranolol)

## 2016-09-06 NOTE — Telephone Encounter (Signed)
Received a call from [facility]  regarding medication: Propranolol  Patient needs a refill of medication.   Patient having side effects from medication.   Patient calling to update [place] on medication. He would like you to call him back regarding this medication. Thanks

## 2016-09-09 MED ORDER — PROPRANOLOL HCL ER 60 MG PO CP24: ORAL_CAPSULE | ORAL | 5 refills | Status: DC

## 2016-09-09 NOTE — Telephone Encounter (Signed)
Patient given instructions and agreed with plan.  

## 2016-09-09 NOTE — Telephone Encounter (Signed)
Please advise 

## 2016-09-09 NOTE — Telephone Encounter (Signed)
Please instruct him to stop propranolol immediate release 40mg .  Start extended release propranolol 60mg  + continue to take extended release 160mg  daily.  If he develops fatigue, shortness of breath, or slowed heart rate, we will need to add an alternative medication.  Donika K. Posey Pronto, DO

## 2016-09-26 DIAGNOSIS — D692 Other nonthrombocytopenic purpura: Secondary | ICD-10-CM | POA: Diagnosis not present

## 2016-10-29 DIAGNOSIS — G250 Essential tremor: Secondary | ICD-10-CM | POA: Diagnosis not present

## 2016-10-29 DIAGNOSIS — J449 Chronic obstructive pulmonary disease, unspecified: Secondary | ICD-10-CM | POA: Diagnosis not present

## 2016-10-29 DIAGNOSIS — M549 Dorsalgia, unspecified: Secondary | ICD-10-CM | POA: Diagnosis not present

## 2016-10-29 DIAGNOSIS — D692 Other nonthrombocytopenic purpura: Secondary | ICD-10-CM | POA: Diagnosis not present

## 2016-10-29 DIAGNOSIS — R0781 Pleurodynia: Secondary | ICD-10-CM | POA: Diagnosis not present

## 2016-12-10 DIAGNOSIS — J310 Chronic rhinitis: Secondary | ICD-10-CM | POA: Diagnosis not present

## 2016-12-10 DIAGNOSIS — R0781 Pleurodynia: Secondary | ICD-10-CM | POA: Diagnosis not present

## 2016-12-10 DIAGNOSIS — M549 Dorsalgia, unspecified: Secondary | ICD-10-CM | POA: Diagnosis not present

## 2016-12-10 DIAGNOSIS — Z23 Encounter for immunization: Secondary | ICD-10-CM | POA: Diagnosis not present

## 2016-12-10 DIAGNOSIS — G250 Essential tremor: Secondary | ICD-10-CM | POA: Diagnosis not present

## 2016-12-23 DIAGNOSIS — F319 Bipolar disorder, unspecified: Secondary | ICD-10-CM | POA: Diagnosis not present

## 2017-01-15 DIAGNOSIS — H35033 Hypertensive retinopathy, bilateral: Secondary | ICD-10-CM | POA: Diagnosis not present

## 2017-01-15 DIAGNOSIS — H524 Presbyopia: Secondary | ICD-10-CM | POA: Diagnosis not present

## 2017-01-27 ENCOUNTER — Ambulatory Visit (INDEPENDENT_AMBULATORY_CARE_PROVIDER_SITE_OTHER): Payer: Medicare Other | Admitting: Neurology

## 2017-01-27 ENCOUNTER — Encounter: Payer: Self-pay | Admitting: Neurology

## 2017-01-27 VITALS — BP 100/60 | HR 67 | Ht 69.0 in | Wt 189.4 lb

## 2017-01-27 DIAGNOSIS — G250 Essential tremor: Secondary | ICD-10-CM

## 2017-01-27 MED ORDER — PRIMIDONE 50 MG PO TABS: ORAL_TABLET | ORAL | 5 refills | Status: DC

## 2017-01-27 NOTE — Patient Instructions (Addendum)
Continue your medications as you are taking  We will add primidone 25mg  (half-tablet) at bedtime for one week, then increase to 25mg  twice daily   Call the office in month and we can discuss increasing this medication, if you are tolerating it  Return to clinic in 4 months

## 2017-01-27 NOTE — Progress Notes (Signed)
Follow-up Visit   Date: 01/27/17    Gabriel Richardson MRN: [id number] DOB: 02-08-1946   Interim History: Gabriel Richardson is a 71 y.o. right-handed Caucasian male with history of depression, asthma, hypertension, and intention tremor returning to the clinic with essential tremor and right trigeminal neuralgia.  The patient was accompanied to the clinic by self.  History of present illness: Initial visit 04/30/2013:  He had previously been a patient of Dr. [name] since 1995, initially for right trigeminal neuralgia which he had frequent flares of since 1997 - early 2000. He has tried trileptal, neurontin, and lyrica and had the most relief with Lyrica. He has not had a flare since 2000, until 2016.    Around 2012, he started developing muscle cramps and left hand tremors and was concerned about multiple sclerosis. He had an evaluation including imaging. Although there was white matter changes on his MRI, Dr. [name] did not find that his exam and imaging was consistent with demyelinating disease. He also had CSF testing in January 2014 which did not show any inflammatory changes. He treated him symptomatically and was started on propranolol 60mg  which has signifcantly helped with his tremors. He takes robaxin 500mg  twice daily as needed which also resolves his muscle cramps.  He used to have occasionally has bilateral painful hand cramps and severe neck pain. He underwent anterior cervical fusion by Dr. [name] in 2010 which totally resolved his symptoms. He has also has history of left L5 radiculopathy status post L5-S1 laminectomy.  He saw Dr. [name] in July 2016 for new left ptosis. Myasthenia antibodies were checked and was normal. He declined to have additional testing done to evaluate this.  Late 2016, he began experiencing right facial shooting paresthesias over the upper and lower lip especially with light tactile stimuli, chewing, brushing, and talking. He has exquisite sensitivity over  the right upper lip.  He denies painful paresthesias over the forehead and cheek.  Denies any changes in taste or facial weakness.  His hand tremors are well-controlled on inderal 60mg  and he has noticed that it is improved with alcohol.  His cramps are nearly resolved with robaxin.  He has chronic low back pain and sees Dr. [name] for this and have been contemplating surgery.  He takes tramadol as needed.  He saw Dr. [name] for low back pain follow-up, but also discussed his trigeminal neuralgia who states that he is able to microvascular decompression surgery for this, if medications are ineffective.  He currently takes Lyrica 100mg  twice daily, because he is unable to tolerate extra dose in the afternoon (blurry vision, sedation).   UPDATE 04/17/2016:   He is here for follow-up appointment.  At his last visit, I recommended restarting Lyrica, but he could not tolerate Lyrica at 50mg  because of blurred vision and drowsiness so discontinued it.  Despite being off medication for this, his severe lancinating pain of the cheek and jaw has resolved, but he has new throbbing pain in the right lower jaw.  He saw his dentist, endodontist, oral surgeon who thinks his pain may be due to trigeminal neuralgia.  He achy pain feels like a tooth abscess.  It is worse with chewing and activity, but does not have electrical, radiating pain like he does with trigeminal neuralgia.  This jaw pain is very different from the hypersensitivity and neuropathic pain with trigeminal neualgia.  He takes hydrocodone and tylenol which alleviates the pain.  He does report having some dental intervention to  one of the teeth, which alleviated the pain but continues to have the same pain involving two neighboring teeth.  He also complains of bilateral hand tremor which is worse.  It was well controlled on propranolol 60mg    UPDATE 07/29/2016:  He is seen in follow-up today for tremors.  He noticed that tremors were better controlled on  160mg  daily, but sometimes has spells where they can be worse and interfere with fine motor tasks, such as having dinner which can cause social embarrassment. They also tend to be worse in the morning, before he takes his tablet. No new complaints with respect to his trigeminal neuralgia, he continues to have achy pain over the right lower jaw and is seeing his dentist for this.  He also complains of muscle cramps involving the left hand and lower legs which occurs at night and can be painful.  He drinks plenty of water.     UPDATE 01/27/2017:  At his last visit, we increased his propranolol to 220mg  daily but he feels that this has not helped and his tremor are worse.  He especially has difficulty with feeding himself and has started to use his left hand to eat.  He has difficulty drinking water from a cup and often uses both hands, a straw, or uses his left hand. He also noticed an intermittent right sided chin quiver, which occurs in the morning and lasts a few minutes.   He no longer has facial pain.    Medications:  Current Outpatient Prescriptions on File Prior to Visit  Medication Sig Dispense Refill  . ALPRAZolam (XANAX) 0.5 MG tablet Take 0.5 mg by mouth daily as needed.  5  . ARIPiprazole (ABILIFY) 2 MG tablet Take 2 mg by mouth every morning.  4  . lamoTRIgine (LAMICTAL) 200 MG tablet Take 200 mg by mouth at bedtime.  1  . meclizine (ANTIVERT) 25 MG tablet Take 1 tablet (25 mg total) by mouth 3 (three) times daily as needed for dizziness. 15 tablet 0  . methocarbamol (ROBAXIN) 500 MG tablet 1 tablet twice a day 60 tablet 5  . propranolol ER (INDERAL LA) 160 MG SR capsule Take 1 capsule (160 mg total) by mouth daily. 90 capsule 3  . propranolol ER (INDERAL LA) 60 MG 24 hr capsule Take 1 tablet daily in combination with 160mg  tablet. 30 capsule 5  . sildenafil (VIAGRA) 100 MG tablet Take 1 tablet (100 mg total) by mouth daily as needed for erectile dysfunction. 12 tablet 11  . traMADol  (ULTRAM) 50 MG tablet Take 50 mg by mouth 2 (two) times daily as needed.    . zolpidem (AMBIEN) 10 MG tablet Take 10 mg by mouth at bedtime.  1   No current facility-administered medications on file prior to visit.     Allergies: No Known Allergies  Review of Systems:  CONSTITUTIONAL: No fevers, chills, night sweats, or weight loss.  EYES: No visual changes or eye pain ENT: No hearing changes.  No history of nose bleeds.   RESPIRATORY: No cough, wheezing and shortness of breath.   CARDIOVASCULAR: Negative for chest pain, and palpitations.   GI: Negative for abdominal discomfort, blood in stools or black stools.  No recent change in bowel habits.   GU:  No history of incontinence.   MUSCLOSKELETAL: No history of joint pain or swelling.  No myalgias.   SKIN: Negative for lesions, rash, and itching.   ENDOCRINE: Negative for cold or heat intolerance, polydipsia or goiter.  PSYCH:  No depression or anxiety symptoms.   NEURO: As Above.   Vital Signs:  BP 100/60   Pulse 67   Ht 5\' 9"  (1.753 m)   Wt 189 lb 6 oz (85.9 kg)   SpO2 97%   BMI 27.97 kg/m   General:  Well appearing, comfortable  Neurological Exam: MENTAL STATUS:  Intact to person, place, and time.   CRANIAL NERVES: Pupils round and reactive, extraocular muscles intact.  Mild left ptosis (old).  Normal facial sensation to temperature.  Face is symmetric.  MOTOR: Motor strength is 5/5 throughout.  He continues to have a mild R > left hand intention and postural tremor. Tone is normal.  COORDINATION/GAIT:  Gait is narrow-based, stable.  Mildly reduced arm swing bilaterally.   Data: AChR blocking and binding 11/02/2012 - neg  CSF 06/02/2012: R0 W1 G63 P43 IgG index 0.43 OCB -neg  MRI brain wwo contrast 07/08/2009: 1. Advanced white matter disease in a pattern compatible with multiple sclerosis. Mild progression since 2004. No areas of  enhancement.  2. No acute intracranial  abnormality.   IMPRESSION/PLAN: 1. Benign essential tremor of the hands, clinically worsening despite being on propranolol 220mg /d which has slowly been increased over the past year.  His blood pressure is 100/60 and heart rate 67, therefore, I do not recommend titrating his betablocker any higher. Instead, I will start him on primidone and hope this can provide better tremor control.  If so, ideally, would like to taper his propranolol and control on monotherapy with primidone.   - Continue propranolol LA 220 mg daily  - Start primidone 25mg  at bedtime x 1 week, then increase to 25mg  BID.   - He will call with update in 1 month to determine further titration  2.  Right trigeminal neuralgia is in remission.    - Previously tried gabapentin, tegretol, Lyrica  - He already takes lamictal 200mg  at bedtime as a mood stabilizer  Return to clinic in 4 months   Greater than 50% of this 30 minute visit was spent in counseling, explanation of diagnosis, planning of further management, and coordination of care due to the nature of their progressive symptoms.    Thank you for allowing me to participate in patient's care.  If I can answer any additional questions, I would be pleased to do so.    Sincerely,    Rafal Archuleta K. Posey Pronto, DO

## 2017-02-03 DIAGNOSIS — L820 Inflamed seborrheic keratosis: Secondary | ICD-10-CM | POA: Diagnosis not present

## 2017-02-03 DIAGNOSIS — L821 Other seborrheic keratosis: Secondary | ICD-10-CM | POA: Diagnosis not present

## 2017-02-24 ENCOUNTER — Other Ambulatory Visit: Payer: Self-pay | Admitting: Neurology

## 2017-02-24 DIAGNOSIS — Z23 Encounter for immunization: Secondary | ICD-10-CM | POA: Diagnosis not present

## 2017-04-22 DIAGNOSIS — M25811 Other specified joint disorders, right shoulder: Secondary | ICD-10-CM | POA: Diagnosis not present

## 2017-04-22 DIAGNOSIS — M25511 Pain in right shoulder: Secondary | ICD-10-CM | POA: Diagnosis not present

## 2017-04-29 DIAGNOSIS — G8929 Other chronic pain: Secondary | ICD-10-CM | POA: Diagnosis not present

## 2017-04-29 DIAGNOSIS — M25562 Pain in left knee: Secondary | ICD-10-CM | POA: Diagnosis not present

## 2017-04-29 DIAGNOSIS — M25561 Pain in right knee: Secondary | ICD-10-CM | POA: Diagnosis not present

## 2017-04-29 DIAGNOSIS — M7541 Impingement syndrome of right shoulder: Secondary | ICD-10-CM | POA: Diagnosis not present

## 2017-05-05 DIAGNOSIS — F319 Bipolar disorder, unspecified: Secondary | ICD-10-CM | POA: Diagnosis not present

## 2017-05-30 ENCOUNTER — Ambulatory Visit: Payer: Medicare Other | Admitting: Neurology

## 2017-06-16 DIAGNOSIS — F319 Bipolar disorder, unspecified: Secondary | ICD-10-CM | POA: Diagnosis not present

## 2017-06-16 DIAGNOSIS — J449 Chronic obstructive pulmonary disease, unspecified: Secondary | ICD-10-CM | POA: Diagnosis not present

## 2017-06-23 ENCOUNTER — Other Ambulatory Visit: Payer: Self-pay | Admitting: *Deleted

## 2017-06-23 MED ORDER — PROPRANOLOL HCL ER 60 MG PO CP24: ORAL_CAPSULE | ORAL | 3 refills | Status: DC

## 2017-06-30 ENCOUNTER — Encounter: Payer: Self-pay | Admitting: Gastroenterology

## 2017-06-30 DIAGNOSIS — Z1211 Encounter for screening for malignant neoplasm of colon: Secondary | ICD-10-CM | POA: Diagnosis not present

## 2017-06-30 DIAGNOSIS — J449 Chronic obstructive pulmonary disease, unspecified: Secondary | ICD-10-CM | POA: Diagnosis not present

## 2017-08-04 ENCOUNTER — Other Ambulatory Visit: Payer: Self-pay | Admitting: Neurology

## 2017-08-05 ENCOUNTER — Telehealth: Payer: Self-pay | Admitting: Neurology

## 2017-08-05 NOTE — Telephone Encounter (Signed)
1.     Name of medication: Eropranolol  2.     How are you currently taking this medication (dosage and times per day)? QD (160mg  and 60mg )  3.     Are you having a reaction (difficulty breathing--STAT)? NO  4.     What is your medication issue? Does not feel that the medications is helping with the hand tremors. It was originally was helping but is no longer controlling the hand tremors. He needs refills however wants to know if provider wants to change dosage or med. Feels like no reason to take medication if it is not going to help.

## 2017-08-05 NOTE — Telephone Encounter (Signed)
Do you want to try something different?

## 2017-08-05 NOTE — Telephone Encounter (Signed)
At his last visit, I recommended starting primidone 25 mg twice daily. Please find out if he is taking this and what dose..  If this medication is not making him too sleepy or tired, we can increase it.  He can reduce propranolol to 60 mg 3 times a day x 1 week, then 60 mg twice a day, and stop.  If his tremors get worse as we reduce the dose, we will know that it was helping and to stay on the lowest dose.  Thanks.

## 2017-08-06 ENCOUNTER — Other Ambulatory Visit: Payer: Self-pay | Admitting: *Deleted

## 2017-08-06 MED ORDER — PROPRANOLOL HCL ER 60 MG PO CP24: ORAL_CAPSULE | ORAL | 0 refills | Status: DC

## 2017-08-06 NOTE — Telephone Encounter (Signed)
I spoke with patient and gave him instructions.  Since there seemed to be some confusion about what he is taking now and the new instructions, I requested for patient to be put on the waiting list to come in and get written instructions.  I will send in Rx for some more propranolol per patient's request.

## 2017-08-11 ENCOUNTER — Other Ambulatory Visit: Payer: Self-pay | Admitting: Neurology

## 2017-08-11 ENCOUNTER — Telehealth: Payer: Self-pay | Admitting: Neurology

## 2017-08-11 NOTE — Telephone Encounter (Signed)
If patient is at the pharmacy, call can be transferred to refill team.  1.     Which medications need to be refilled? (please list name of each medication and dose if know) Propranolol  2.     Which pharmacy/location (including street and city if local pharmacy) is medication to be sent to? He uses [facility]  3.     Do they need a 30 or 90 day supply? He will have them send a New request. Thanks

## 2017-08-11 NOTE — Telephone Encounter (Signed)
Noted  

## 2017-08-12 ENCOUNTER — Other Ambulatory Visit: Payer: Self-pay | Admitting: *Deleted

## 2017-08-12 MED ORDER — PROPRANOLOL HCL ER 60 MG PO CP24: ORAL_CAPSULE | ORAL | 3 refills | Status: DC

## 2017-08-12 MED ORDER — PROPRANOLOL HCL ER 160 MG PO CP24: 160.0000 mg | ORAL_CAPSULE | Freq: Every day | ORAL | 3 refills | Status: DC

## 2017-08-13 DIAGNOSIS — L600 Ingrowing nail: Secondary | ICD-10-CM | POA: Diagnosis not present

## 2017-08-15 DIAGNOSIS — Z79899 Other long term (current) drug therapy: Secondary | ICD-10-CM | POA: Diagnosis not present

## 2017-08-15 DIAGNOSIS — G500 Trigeminal neuralgia: Secondary | ICD-10-CM | POA: Diagnosis not present

## 2017-08-15 DIAGNOSIS — Z125 Encounter for screening for malignant neoplasm of prostate: Secondary | ICD-10-CM | POA: Diagnosis not present

## 2017-08-15 DIAGNOSIS — G250 Essential tremor: Secondary | ICD-10-CM | POA: Diagnosis not present

## 2017-08-15 DIAGNOSIS — F329 Major depressive disorder, single episode, unspecified: Secondary | ICD-10-CM | POA: Diagnosis not present

## 2017-08-15 DIAGNOSIS — D692 Other nonthrombocytopenic purpura: Secondary | ICD-10-CM | POA: Diagnosis not present

## 2017-08-18 ENCOUNTER — Other Ambulatory Visit: Payer: Self-pay

## 2017-08-18 ENCOUNTER — Ambulatory Visit (AMBULATORY_SURGERY_CENTER): Payer: Self-pay | Admitting: *Deleted

## 2017-08-18 VITALS — Ht 69.0 in | Wt 209.0 lb

## 2017-08-18 DIAGNOSIS — Z1211 Encounter for screening for malignant neoplasm of colon: Secondary | ICD-10-CM

## 2017-08-18 MED ORDER — NA SULFATE-K SULFATE-MG SULF 17.5-3.13-1.6 GM/177ML PO SOLN: ORAL | 0 refills | Status: DC

## 2017-08-18 NOTE — Progress Notes (Signed)
Patient denies any allergies to eggs or soy. Patient denies any problems with anesthesia/sedation. Patient denies any oxygen use at home. Patient denies taking any diet/weight loss medications or blood thinners. EMMI education declined by pt. Suprep sample kit requested by pt because his neighbor got one last week and it is not covered by his insurance. Suprep sample given to pt.  

## 2017-08-25 ENCOUNTER — Telehealth: Payer: Self-pay | Admitting: Gastroenterology

## 2017-08-25 DIAGNOSIS — F329 Major depressive disorder, single episode, unspecified: Secondary | ICD-10-CM | POA: Diagnosis not present

## 2017-08-25 DIAGNOSIS — D692 Other nonthrombocytopenic purpura: Secondary | ICD-10-CM | POA: Diagnosis not present

## 2017-08-25 DIAGNOSIS — G250 Essential tremor: Secondary | ICD-10-CM | POA: Diagnosis not present

## 2017-08-25 DIAGNOSIS — Z6831 Body mass index (BMI) 31.0-31.9, adult: Secondary | ICD-10-CM | POA: Diagnosis not present

## 2017-08-25 DIAGNOSIS — Z0000 Encounter for general adult medical examination without abnormal findings: Secondary | ICD-10-CM | POA: Diagnosis not present

## 2017-08-25 DIAGNOSIS — Z1389 Encounter for screening for other disorder: Secondary | ICD-10-CM | POA: Diagnosis not present

## 2017-08-25 DIAGNOSIS — J449 Chronic obstructive pulmonary disease, unspecified: Secondary | ICD-10-CM | POA: Diagnosis not present

## 2017-08-25 DIAGNOSIS — Z125 Encounter for screening for malignant neoplasm of prostate: Secondary | ICD-10-CM | POA: Diagnosis not present

## 2017-08-25 DIAGNOSIS — Z5181 Encounter for therapeutic drug level monitoring: Secondary | ICD-10-CM | POA: Diagnosis not present

## 2017-08-25 DIAGNOSIS — R739 Hyperglycemia, unspecified: Secondary | ICD-10-CM | POA: Diagnosis not present

## 2017-08-25 DIAGNOSIS — E669 Obesity, unspecified: Secondary | ICD-10-CM | POA: Diagnosis not present

## 2017-08-25 DIAGNOSIS — G500 Trigeminal neuralgia: Secondary | ICD-10-CM | POA: Diagnosis not present

## 2017-08-25 NOTE — Telephone Encounter (Signed)
Returned patients call. Patient was told that he can proceed with scheduled colonoscopy even though he ate corn and salads. Patient states that he has been on clear liquids all day. Patient was instructed to drink a lot of clear liquids until three hours before his procedure. Patient verbalizes understanding.   Riki Sheer, LPN ( Admitting )

## 2017-08-26 ENCOUNTER — Other Ambulatory Visit: Payer: Self-pay

## 2017-08-26 ENCOUNTER — Ambulatory Visit (AMBULATORY_SURGERY_CENTER): Payer: Medicare Other | Admitting: Gastroenterology

## 2017-08-26 VITALS — BP 118/64 | HR 65 | Temp 98.9°F | Resp 16 | Ht 69.0 in | Wt 209.0 lb

## 2017-08-26 DIAGNOSIS — Z1211 Encounter for screening for malignant neoplasm of colon: Secondary | ICD-10-CM | POA: Diagnosis not present

## 2017-08-26 DIAGNOSIS — D128 Benign neoplasm of rectum: Secondary | ICD-10-CM

## 2017-08-26 DIAGNOSIS — D123 Benign neoplasm of transverse colon: Secondary | ICD-10-CM

## 2017-08-26 DIAGNOSIS — D120 Benign neoplasm of cecum: Secondary | ICD-10-CM

## 2017-08-26 DIAGNOSIS — K621 Rectal polyp: Secondary | ICD-10-CM | POA: Diagnosis not present

## 2017-08-26 MED ORDER — SODIUM CHLORIDE 0.9 % IV SOLN
500.0000 mL | Freq: Once | INTRAVENOUS | Status: DC
Start: 2017-08-26 — End: 2018-12-31

## 2017-08-26 NOTE — Progress Notes (Signed)
Spontaneous respirations throughout. VSS. Resting comfortably. To PACU on room air. Report to  RN. 

## 2017-08-26 NOTE — Patient Instructions (Signed)
Handout given on polyps  YOU HAD AN ENDOSCOPIC PROCEDURE TODAY AT THE  ENDOSCOPY CENTER:   Refer to the procedure report that was given to you for any specific questions about what was found during the examination.  If the procedure report does not answer your questions, please call your gastroenterologist to clarify.  If you requested that your care partner not be given the details of your procedure findings, then the procedure report has been included in a sealed envelope for you to review at your convenience later.  YOU SHOULD EXPECT: Some feelings of bloating in the abdomen. Passage of more gas than usual.  Walking can help get rid of the air that was put into your GI tract during the procedure and reduce the bloating. If you had a lower endoscopy (such as a colonoscopy or flexible sigmoidoscopy) you may notice spotting of blood in your stool or on the toilet paper. If you underwent a bowel prep for your procedure, you may not have a normal bowel movement for a few days.  Please Note:  You might notice some irritation and congestion in your nose or some drainage.  This is from the oxygen used during your procedure.  There is no need for concern and it should clear up in a day or so.  SYMPTOMS TO REPORT IMMEDIATELY:   Following lower endoscopy (colonoscopy or flexible sigmoidoscopy):  Excessive amounts of blood in the stool  Significant tenderness or worsening of abdominal pains  Swelling of the abdomen that is new, acute  Fever of 100F or higher    For urgent or emergent issues, a gastroenterologist can be reached at any hour by calling [phone].   DIET:  We do recommend a small meal at first, but then you may proceed to your regular diet.  Drink plenty of fluids but you should avoid alcoholic beverages for 24 hours.  ACTIVITY:  You should plan to take it easy for the rest of today and you should NOT DRIVE or use heavy machinery until tomorrow (because of the sedation  medicines used during the test).    FOLLOW UP: Our staff will call the number listed on your records the next business day following your procedure to check on you and address any questions or concerns that you may have regarding the information given to you following your procedure. If we do not reach you, we will leave a message.  However, if you are feeling well and you are not experiencing any problems, there is no need to return our call.  We will assume that you have returned to your regular daily activities without incident.  If any biopsies were taken you will be contacted by phone or by letter within the next 1-3 [place].  Please call us at [phone] if you have not heard about the biopsies in 3 [place].    SIGNATURES/CONFIDENTIALITY: You and/or your care partner have signed paperwork which will be entered into your electronic medical record.  These signatures attest to the fact that that the information above on your After Visit Summary has been reviewed and is understood.  Full responsibility of the confidentiality of this discharge information lies with you and/or your care-partner. 

## 2017-08-26 NOTE — Op Note (Signed)
[facility] Patient Name: Gabriel Richardson Procedure Date: 08/26/2017 10:49 AM MRN: [id number] Endoscopist: Remo Lipps P. Armbruster MD, MD Age: 72 Referring MD:  Date of Birth: 08-Aug-1945 Gender: Male Account #: [id number] Procedure:                Colonoscopy Indications:              Screening for colorectal malignant neoplasm Medicines:                Monitored Anesthesia Care Procedure:                Pre-Anesthesia Assessment:                           - Prior to the procedure, a History and Physical                            was performed, and patient medications and                            allergies were reviewed. The patient's tolerance of                            previous anesthesia was also reviewed. The risks                            and benefits of the procedure and the sedation                            options and risks were discussed with the patient.                            All questions were answered, and informed consent                            was obtained. Prior Anticoagulants: The patient has                            taken no previous anticoagulant or antiplatelet                            agents. ASA Grade Assessment: II - A patient with                            mild systemic disease. After reviewing the risks                            and benefits, the patient was deemed in                            satisfactory condition to undergo the procedure.                           After obtaining informed consent, the colonoscope  was passed under direct vision. Throughout the                            procedure, the patient's blood pressure, pulse, and                            oxygen saturations were monitored continuously. The                            Colonoscope was introduced through the anus and                            advanced to the the cecum, identified by                            appendiceal orifice and  ileocecal valve. The                            colonoscopy was performed without difficulty. The                            patient tolerated the procedure well. The quality                            of the bowel preparation was adequate. The                            ileocecal valve, appendiceal orifice, and rectum                            were photographed. Scope In: 10:55:42 AM Scope Out: 49:70:26 AM Scope Withdrawal Time: 0 hours 12 minutes 17 seconds  Total Procedure Duration: 0 hours 13 minutes 31 seconds  Findings:                 The perianal and digital rectal examinations were                            normal.                           A diminutive polyp was found in the cecum. The                            polyp was sessile. The polyp was removed with a                            cold snare. Resection and retrieval were complete.                           A 7 mm polyp was found in the transverse colon. The                            polyp was sessile. The polyp was removed with a  cold snare. Resection and retrieval were complete.                           A 4 mm polyp was found in the rectum. The polyp was                            sessile. The polyp was removed with a cold snare.                            Resection and retrieval were complete.                           Multiple small-mouthed diverticula were found in                            the sigmoid colon.                           The exam was otherwise without abnormality on                            direct and retroflexion views. Complications:            No immediate complications. Estimated blood loss:                            Minimal. Estimated Blood Loss:     Estimated blood loss was minimal. Impression:               - One diminutive polyp in the cecum, removed with a                            cold snare. Resected and retrieved.                           - One 7 mm polyp  in the transverse colon, removed                            with a cold snare. Resected and retrieved.                           - One 4 mm polyp in the rectum, removed with a cold                            snare. Resected and retrieved.                           - Diverticulosis in the sigmoid colon.                           - The examination was otherwise normal on direct                            and retroflexion views. Recommendation:           -  Patient has a contact number available for                            emergencies. The signs and symptoms of potential                            delayed complications were discussed with the                            patient. Return to normal activities tomorrow.                            Written discharge instructions were provided to the                            patient.                           - Resume previous diet.                           - Continue present medications.                           - Await pathology results.                           - Repeat colonoscopy is recommended for                            surveillance. The colonoscopy date will be                            determined after pathology results from today's                            exam become available for review. Remo Lipps P. Armbruster MD, MD 08/26/2017 11:13:38 AM This report has been signed electronically.

## 2017-08-26 NOTE — Progress Notes (Signed)
I have reviewed the patient's medical history in detail and updated the computerized patient record.

## 2017-08-26 NOTE — Progress Notes (Signed)
Called to room to assist during endoscopic procedure.  Patient ID and intended procedure confirmed with present staff. Received instructions for my participation in the procedure from the performing physician.  

## 2017-08-27 ENCOUNTER — Telehealth: Payer: Self-pay

## 2017-08-27 NOTE — Telephone Encounter (Signed)
  Follow up Call-  Call back number 08/26/2017  Post procedure Call Back phone  # [phone]  Permission to leave phone message Yes  Some recent data might be hidden     Patient questions:  Do you have a fever, pain , or abdominal swelling? No. Pain Score  0 *  Have you tolerated food without any problems? Yes.    Have you been able to return to your normal activities? Yes.    Do you have any questions about your discharge instructions: Diet   No. Medications  No. Follow up visit  No.  Do you have questions or concerns about your Care? No.  Actions: * If pain score is 4 or above: No action needed, pain <4.

## 2017-08-27 NOTE — Telephone Encounter (Signed)
  Follow up Call-  Call back number 08/26/2017  Post procedure Call Back phone  # [phone]  Permission to leave phone message Yes  Some recent data might be hidden     Left message

## 2017-08-28 ENCOUNTER — Encounter: Payer: Self-pay | Admitting: Gastroenterology

## 2017-09-12 NOTE — Progress Notes (Signed)
Follow-up Visit   Date: 09/15/17    Gabriel Richardson MRN: [id number] DOB: [birth year]   Interim History: ANTERIO SCHEEL is a 72 y.o. right-handed Caucasian male with history of depression, asthma, hypertension, and intention tremor returning to the clinic with essential tremor and right trigeminal neuralgia.  The patient was accompanied to the clinic by self.  History of present illness: Initial visit 04/30/2013:  He had previously been a patient of Dr. [name] since 1995, initially for right trigeminal neuralgia which he had frequent flares of since 1997 - early 2000. He has tried trileptal, neurontin, and lyrica and had the most relief with Lyrica. He has not had a flare since 2000, until 2016.    Around 2012, he started developing muscle cramps and left hand tremors and was concerned about multiple sclerosis. Although there was white matter changes on his MRI, Dr. [name] did not find that his exam and imaging was consistent with demyelinating disease. He also had CSF testing in January 2014 which did not show any inflammatory changes. He treated him symptomatically and was started on propranolol 18m which has helped with his tremors. He takes robaxin 501mtwice daily as needed which also resolves his muscle cramps.  He is s/p anterior cervical fusion by Dr. MaGlenna Fellowsn 2010 and has history of left L5 radiculopathy status post L5-S1 laminectomy.  He saw Dr. SuJanann Coloneln July 2016 for new left ptosis. Myasthenia antibodies were checked and was normal. He declined to have additional testing done to evaluate this.  Late 2016, he began experiencing right facial shooting paresthesias over the upper and lower lip especially with light tactile stimuli, chewing, brushing, and talking. He has exquisite sensitivity over the right upper lip.  He denies painful paresthesias over the forehead and cheek.  His hand tremors are well-controlled on inderal 6029mnd he has noticed that it is improved with  alcohol.  He has chronic low back pain and sees Dr. NudSherwood Gamblerr this and have been contemplating surgery.  He takes tramadol as needed.  He saw Dr. NudSherwood Gamblerr low back pain follow-up, but also discussed his trigeminal neuralgia who states that he is able to microvascular decompression surgery for this, if medications are ineffective.  He currently takes Lyrica 100m4mice daily, because he is unable to tolerate extra dose in the afternoon (blurry vision, sedation).   Starting in 2018, his hand tremors started getting worse.  His propranolol was steadily increased to 220/d without benefit, so primidone 25mg61m added.  UPDATE 09/15/2017:  He is here for 6 month follow-up visit.  Unfortunately, his tremors continue to interfere with his daily activities, such as feeding, shaving, and other fine motor tasks.  The tremor causes social embarrassment and he has started to use the left hand to feed himself, but this can be problematic too.  He is compliant with taking his medications.  No side effects to propranolol or primidone.  He denies any exacerbations of trigeminal neuralgia and cramps are not longer a problem.   Medications:  Current Outpatient Medications on File Prior to Visit  Medication Sig Dispense Refill  . celecoxib (CELEBREX) 200 MG capsule TAKE 1 CAPSULE BY MOUTH EVERY DAY WITH FOOD  0  . FLOVENT HFA 110 MCG/ACT inhaler Inhale 1 puff into the lungs 2 (two) times daily.  0  . lamoTRIgine (LAMICTAL) 200 MG tablet Take 200 mg by mouth at bedtime.  1  . Na Sulfate-K Sulfate-Mg Sulf 17.5-3.13-1.6 GM/177ML SOLN Suprep 1  kit given to pt/ LOT [id number]/exp/1/21 2 Bottle 0  . PROAIR HFA 108 (90 Base) MCG/ACT inhaler Inhale 2 puffs into the lungs every 6 (six) hours as needed.  1  . sildenafil (VIAGRA) 100 MG tablet Take 1 tablet (100 mg total) by mouth daily as needed for erectile dysfunction. 12 tablet 11  . traMADol (ULTRAM) 50 MG tablet Take 50 mg by mouth 2 (two) times daily as needed.    .  zolpidem (AMBIEN) 10 MG tablet Take 10 mg by mouth at bedtime.  1   Current Facility-Administered Medications on File Prior to Visit  Medication Dose Route Frequency Provider Last Rate Last Dose  . 0.9 %  sodium chloride infusion  500 mL Intravenous Once Armbruster, Carlota Raspberry, MD        Allergies: No Known Allergies  Review of Systems:  CONSTITUTIONAL: No fevers, chills, night sweats, or weight loss.  EYES: No visual changes or eye pain ENT: No hearing changes.  No history of nose bleeds.   RESPIRATORY: No cough, wheezing and shortness of breath.   CARDIOVASCULAR: Negative for chest pain, and palpitations.   GI: Negative for abdominal discomfort, blood in stools or black stools.  No recent change in bowel habits.   GU:  No history of incontinence.   MUSCLOSKELETAL: No history of joint pain or swelling.  No myalgias.   SKIN: Negative for lesions, rash, and itching.   ENDOCRINE: Negative for cold or heat intolerance, polydipsia or goiter.   PSYCH:  No depression or anxiety symptoms.   NEURO: As Above.   Vital Signs:  BP 110/70   Pulse 69   Ht 5' 9" (1.753 m)   Wt 204 lb 8 oz (92.8 kg)   SpO2 97%   BMI 30.20 kg/m   General:  Well appearing, comfortable  Neurological Exam: MENTAL STATUS:  Intact to person, place, and time.   CRANIAL NERVES: Pupils round and reactive, extraocular muscles intact.  Mild left ptosis (old).   Face is symmetric.  MOTOR: Motor strength is 5/5 throughout.  There is a postural tremor the right > left hand, which is worse with arms out-stretched and finger-to-nose testing.  Tone is normal.  COORDINATION/GAIT:  Gait is narrow-based, stable.  Mildly reduced arm swing bilaterally.   Data: AChR blocking and binding 11/02/2012 - neg  CSF 06/02/2012: R0 W1 G63 P43 IgG index 0.43 OCB -neg  MRI brain wwo contrast 07/08/2009: 1. Advanced white matter disease in a pattern compatible with multiple sclerosis. Mild progression since 2004. No areas of   enhancement.  2. No acute intracranial abnormality.   IMPRESSION/PLAN: 1. Benign essential tremor of the hands, clinically worsening.  - Increase propranolol to 126m twice daily.  Caution with further titration due to heart rate  - Increase primidone 53mtwice daily, if tolerating, this can be increased to 5026mn the morning and 100m77m bedtime  2.  Right trigeminal neuralgia - resolved.     - Previously tried gabapentin, tegretol, Lyrica  - He already takes lamictal 200mg70mbedtime as a mood stabilizer  Return to clinic in 3 months  Greater than 50% of this 25 minute visit was spent in counseling, explanation of diagnosis, planning of further management, and coordination of care.    Thank you for allowing me to participate in patient's care.  If I can answer any additional questions, I would be pleased to do so.    Sincerely,    Anorah Trias K. PatelPosey Pronto

## 2017-09-15 ENCOUNTER — Ambulatory Visit (INDEPENDENT_AMBULATORY_CARE_PROVIDER_SITE_OTHER): Payer: Medicare Other | Admitting: Neurology

## 2017-09-15 ENCOUNTER — Encounter: Payer: Self-pay | Admitting: Neurology

## 2017-09-15 VITALS — BP 110/70 | HR 69 | Ht 69.0 in | Wt 204.5 lb

## 2017-09-15 DIAGNOSIS — G250 Essential tremor: Secondary | ICD-10-CM | POA: Diagnosis not present

## 2017-09-15 DIAGNOSIS — F319 Bipolar disorder, unspecified: Secondary | ICD-10-CM | POA: Diagnosis not present

## 2017-09-15 MED ORDER — PROPRANOLOL HCL ER 120 MG PO CP24: 120.0000 mg | ORAL_CAPSULE | Freq: Every day | ORAL | 3 refills | Status: DC

## 2017-09-15 MED ORDER — PRIMIDONE 50 MG PO TABS: ORAL_TABLET | ORAL | 3 refills | Status: DC

## 2017-09-15 NOTE — Patient Instructions (Addendum)
Increase propranolol to 120mg  twice daily  Increase primidone to 50mg  twice daily  Return to clinic July 29th at [facility]

## 2017-10-14 ENCOUNTER — Other Ambulatory Visit: Payer: Self-pay | Admitting: *Deleted

## 2017-10-14 ENCOUNTER — Telehealth: Payer: Self-pay | Admitting: Neurology

## 2017-10-14 MED ORDER — PRIMIDONE 50 MG PO TABS: ORAL_TABLET | ORAL | 5 refills | Status: DC

## 2017-10-14 MED ORDER — PROPRANOLOL HCL ER 120 MG PO CP24: ORAL_CAPSULE | ORAL | 5 refills | Status: DC

## 2017-10-14 NOTE — Telephone Encounter (Signed)
Pt called and has a question about his medication propranolol and the dosage

## 2017-10-15 NOTE — Telephone Encounter (Signed)
I spoke with patient and gave him the correct instructions for his medications.  New prescriptions sent in.

## 2017-11-03 DIAGNOSIS — F319 Bipolar disorder, unspecified: Secondary | ICD-10-CM | POA: Diagnosis not present

## 2017-12-15 ENCOUNTER — Encounter: Payer: Self-pay | Admitting: Neurology

## 2017-12-15 ENCOUNTER — Ambulatory Visit (INDEPENDENT_AMBULATORY_CARE_PROVIDER_SITE_OTHER): Payer: Medicare Other | Admitting: Neurology

## 2017-12-15 VITALS — BP 130/84 | HR 59 | Ht 69.0 in | Wt 195.0 lb

## 2017-12-15 DIAGNOSIS — G250 Essential tremor: Secondary | ICD-10-CM | POA: Diagnosis not present

## 2017-12-15 NOTE — Patient Instructions (Signed)
Increase primidone to 1 tablet in the morning and 2 tablet at bedtime  Continue propranolol 120mg  twice daily  Return to clinic in 6 months

## 2017-12-15 NOTE — Progress Notes (Signed)
Follow-up Visit   Date: 12/15/17    Gabriel Richardson MRN: [id number] DOB: 1946-01-01   Interim History: Gabriel Richardson is a 72 y.o. right-handed Caucasian male with history of depression, asthma, hypertension, and intention tremor returning to the clinic with essential tremor.  The patient was accompanied to the clinic by self.  History of present illness: Initial visit 04/30/2013:  He had previously been a patient of Dr. [name] since 1995, initially for right trigeminal neuralgia which he had frequent flares of since 1997 - early 2000. He has tried trileptal, neurontin, and lyrica and had the most relief with Lyrica. He has not had a flare since 2000, until 2016.    Around 2012, he started developing muscle cramps and left hand tremors and was concerned about multiple sclerosis. Although there was white matter changes on his MRI, Dr. [name] did not find that his exam and imaging was consistent with demyelinating disease. He also had CSF testing in January 2014 which did not show any inflammatory changes. He treated him symptomatically and was started on propranolol 9m which has helped with his tremors. He takes robaxin 5062mtwice daily as needed which also resolves his muscle cramps.  He is s/p anterior cervical fusion by Dr. MaGlenna Fellowsn 2010 and has history of left L5 radiculopathy status post L5-S1 laminectomy.  He saw Dr. SuJanann Coloneln July 2016 for new left ptosis. Myasthenia antibodies were checked and was normal. He declined to have additional testing done to evaluate this.  Late 2016, he began experiencing right facial shooting paresthesias over the upper and lower lip especially with light tactile stimuli, chewing, brushing, and talking. He has exquisite sensitivity over the right upper lip.  He denies painful paresthesias over the forehead and cheek.  His hand tremors are well-controlled on inderal 6063mnd he has noticed that it is improved with alcohol.  He has chronic low back  pain and sees Dr. NudSherwood Gamblerr this and have been contemplating surgery.  He takes tramadol as needed.  He saw Dr. NudSherwood Gamblerr low back pain follow-up, but also discussed his trigeminal neuralgia who states that he is able to microvascular decompression surgery for this, if medications are ineffective.  He currently takes Lyrica 100m38mice daily, because he is unable to tolerate extra dose in the afternoon (blurry vision, sedation).   Starting in 2018, his hand tremors started getting worse.  His propranolol was steadily increased to 220/d without benefit, so primidone 25mg18m added.  UPDATE 09/15/2017:  Unfortunately, his tremors continue to interfere with his daily activities, such as feeding, shaving, and other fine motor tasks.  The tremor causes social embarrassment and he has started to use the left hand to feed himself, but this can be problematic too.  He is compliant with taking his medications.  No side effects to propranolol or primidone.  He denies any exacerbations of trigeminal neuralgia and cramps are not longer a problem.   UPDATE 12/15/2017:  At his last visit, propranolol was increased to 120mg 48mand primidone to 50mg B32m He is tolerating both medications very well and reports marked improvement in tremors, especially on the left which is trace.  His right hand tremor has always been worse and continues to have tremor, which affects fine motor tasks such as holding utensils and eating.  Overall, he feels that the tremors have been the best controlled on his current medications.  Medications:  Current Outpatient Medications on File Prior to Visit  Medication Sig Dispense Refill  . buPROPion (WELLBUTRIN XL) 150 MG 24 hr tablet TAKE 1 TABLET BY MOUTH EVERY DAY IN THE MORNING FOR 4 DAYS THEN 2 TABLETS EVERY MORNING  3  . celecoxib (CELEBREX) 200 MG capsule TAKE 1 CAPSULE BY MOUTH EVERY DAY WITH FOOD  0  . FLOVENT HFA 110 MCG/ACT inhaler Inhale 1 puff into the lungs 2 (two) times  daily.  0  . lamoTRIgine (LAMICTAL) 200 MG tablet Take 200 mg by mouth at bedtime.  1  . Na Sulfate-K Sulfate-Mg Sulf 17.5-3.13-1.6 GM/177ML SOLN Suprep 1 kit given to pt/ LOT [id number]/exp/1/21 2 Bottle 0  . primidone (MYSOLINE) 50 MG tablet Take 50 mg qam and 100 mg qpm 180 tablet 5  . PROAIR HFA 108 (90 Base) MCG/ACT inhaler Inhale 2 puffs into the lungs every 6 (six) hours as needed.  1  . propranolol ER (INDERAL LA) 120 MG 24 hr capsule 120 mg bid 60 capsule 5  . sildenafil (VIAGRA) 100 MG tablet Take 1 tablet (100 mg total) by mouth daily as needed for erectile dysfunction. 12 tablet 11  . traMADol (ULTRAM) 50 MG tablet Take 50 mg by mouth 2 (two) times daily as needed.    . zolpidem (AMBIEN) 10 MG tablet Take 10 mg by mouth at bedtime.  1   Current Facility-Administered Medications on File Prior to Visit  Medication Dose Route Frequency Provider Last Rate Last Dose  . 0.9 %  sodium chloride infusion  500 mL Intravenous Once Armbruster, Carlota Raspberry, MD        Allergies: No Known Allergies  Review of Systems:  CONSTITUTIONAL: No fevers, chills, night sweats, or weight loss.  EYES: No visual changes or eye pain ENT: No hearing changes.  No history of nose bleeds.   RESPIRATORY: No cough, wheezing and shortness of breath.   CARDIOVASCULAR: Negative for chest pain, and palpitations.   GI: Negative for abdominal discomfort, blood in stools or black stools.  No recent change in bowel habits.   GU:  No history of incontinence.   MUSCLOSKELETAL: No history of joint pain or swelling.  No myalgias.   SKIN: Negative for lesions, rash, and itching.   ENDOCRINE: Negative for cold or heat intolerance, polydipsia or goiter.   PSYCH:  No depression or anxiety symptoms.   NEURO: As Above.   Vital Signs:  BP 130/84   Pulse (!) 59   Ht '5\' 9"'  (1.753 m)   Wt 195 lb (88.5 kg)   SpO2 96%   BMI 28.80 kg/m   General Medical Exam:   General:  Well appearing, comfortable  Eyes/ENT: see cranial nerve  examination.   Neck: No masses appreciated.  Full range of motion without tenderness.  No carotid bruits. Respiratory:  Clear to auscultation, good air entry bilaterally.   Cardiac:  Regular rate and rhythm, no murmur.   Ext:  No deformity  Neurological Exam: MENTAL STATUS:  Intact to person, place, and time.   CRANIAL NERVES: Pupils round and reactive, extraocular muscles intact.  Mild left ptosis (old).   Face is symmetric.  MOTOR: Motor strength is 5/5 throughout.  Postural tremor the right of mild-moderate amplitude, worse with arms out-stretched and finger-to-nose testing. Trace tremor on the left hand.  Tone is normal.  COORDINATION/GAIT:  Gait is narrow-based, stable.  Mildly reduced arm swing bilaterally.   Data: AChR blocking and binding 11/02/2012 - neg  CSF 06/02/2012: R0 W1 G63 P43 IgG index 0.43 OCB -neg  MRI brain  wwo contrast 07/08/2009: 1. Advanced white matter disease in a pattern compatible with multiple sclerosis. Mild progression since 2004. No areas of  enhancement.  2. No acute intracranial abnormality.   IMPRESSION/PLAN: 1. Benign essential tremors involving the hands, improved since increasing propranolol to 150m BID.  However, his HR is 59 and I would not titrate his beta blocker any higher.  Stay on propranolol 1227mBID.  Increase primidone to 5028mn the morning and 100m26m bedtime.  2.  Right trigeminal neuralgia - resolved.     - Previously tried gabapentin, tegretol, Lyrica  - He already takes lamictal 200mg77mbedtime as a mood stabilizer  Return to clinic in 6 months   Thank you for allowing me to participate in patient's care.  If I can answer any additional questions, I would be pleased to do so.    Sincerely,    Lindsay Straka K. PatelPosey Pronto

## 2018-01-02 ENCOUNTER — Encounter

## 2018-01-02 ENCOUNTER — Ambulatory Visit: Payer: Medicare Other | Admitting: Neurology

## 2018-01-12 DIAGNOSIS — F319 Bipolar disorder, unspecified: Secondary | ICD-10-CM | POA: Diagnosis not present

## 2018-01-22 ENCOUNTER — Encounter: Payer: Self-pay | Admitting: Neurology

## 2018-02-10 DIAGNOSIS — S0003XS Contusion of scalp, sequela: Secondary | ICD-10-CM | POA: Diagnosis not present

## 2018-02-10 DIAGNOSIS — S0990XA Unspecified injury of head, initial encounter: Secondary | ICD-10-CM | POA: Diagnosis not present

## 2018-03-09 DIAGNOSIS — Z125 Encounter for screening for malignant neoplasm of prostate: Secondary | ICD-10-CM | POA: Diagnosis not present

## 2018-03-09 DIAGNOSIS — G250 Essential tremor: Secondary | ICD-10-CM | POA: Diagnosis not present

## 2018-03-09 DIAGNOSIS — D692 Other nonthrombocytopenic purpura: Secondary | ICD-10-CM | POA: Diagnosis not present

## 2018-03-09 DIAGNOSIS — J449 Chronic obstructive pulmonary disease, unspecified: Secondary | ICD-10-CM | POA: Diagnosis not present

## 2018-03-09 DIAGNOSIS — F329 Major depressive disorder, single episode, unspecified: Secondary | ICD-10-CM | POA: Diagnosis not present

## 2018-03-09 DIAGNOSIS — R739 Hyperglycemia, unspecified: Secondary | ICD-10-CM | POA: Diagnosis not present

## 2018-03-09 DIAGNOSIS — G500 Trigeminal neuralgia: Secondary | ICD-10-CM | POA: Diagnosis not present

## 2018-03-09 DIAGNOSIS — Z5181 Encounter for therapeutic drug level monitoring: Secondary | ICD-10-CM | POA: Diagnosis not present

## 2018-03-09 DIAGNOSIS — E669 Obesity, unspecified: Secondary | ICD-10-CM | POA: Diagnosis not present

## 2018-03-23 DIAGNOSIS — Z23 Encounter for immunization: Secondary | ICD-10-CM | POA: Diagnosis not present

## 2018-03-29 ENCOUNTER — Other Ambulatory Visit: Payer: Self-pay | Admitting: Neurology

## 2018-04-22 DIAGNOSIS — F319 Bipolar disorder, unspecified: Secondary | ICD-10-CM | POA: Diagnosis not present

## 2018-06-04 DIAGNOSIS — Z6829 Body mass index (BMI) 29.0-29.9, adult: Secondary | ICD-10-CM | POA: Diagnosis not present

## 2018-06-04 DIAGNOSIS — M5136 Other intervertebral disc degeneration, lumbar region: Secondary | ICD-10-CM | POA: Diagnosis not present

## 2018-06-04 DIAGNOSIS — M48062 Spinal stenosis, lumbar region with neurogenic claudication: Secondary | ICD-10-CM | POA: Diagnosis not present

## 2018-06-04 DIAGNOSIS — M4310 Spondylolisthesis, site unspecified: Secondary | ICD-10-CM | POA: Diagnosis not present

## 2018-06-04 DIAGNOSIS — M4726 Other spondylosis with radiculopathy, lumbar region: Secondary | ICD-10-CM | POA: Diagnosis not present

## 2018-06-04 DIAGNOSIS — M5440 Lumbago with sciatica, unspecified side: Secondary | ICD-10-CM | POA: Diagnosis not present

## 2018-06-04 DIAGNOSIS — I10 Essential (primary) hypertension: Secondary | ICD-10-CM | POA: Diagnosis not present

## 2018-06-20 NOTE — Progress Notes (Signed)
Follow-up Visit   Date: 06/22/18    Gabriel Richardson MRN: [id number] DOB: 1946/01/27   Interim History: Gabriel Richardson is a 73 y.o. right-handed Caucasian male with history of depression, asthma, hypertension, and intention tremor returning to the clinic with essential tremor.  The patient was accompanied to the clinic by self.  History of present illness: Initial visit 04/30/2013:  He had previously been a patient of Dr. [name] since 1995, initially for right trigeminal neuralgia which he had frequent flares of since 1997 - early 2000. He has tried trileptal, neurontin, and lyrica and had the most relief with Lyrica. He has not had a flare since 2000, until 2016.    Around 2012, he started developing muscle cramps and left hand tremors and was concerned about multiple sclerosis. Although there was white matter changes on his MRI, Dr. [name] did not find that his exam and imaging was consistent with demyelinating disease. He also had CSF testing in January 2014 which did not show any inflammatory changes. He treated him symptomatically and was started on propranolol 60mg  which has helped with his tremors. He takes robaxin 500mg  twice daily as needed which also resolves his muscle cramps.  He is s/p anterior cervical fusion by Dr. [name] in 2010 and has history of left L5 radiculopathy status post L5-S1 laminectomy.  He saw Dr. [name] in July 2016 for new left ptosis. Myasthenia antibodies were checked and was normal. He declined to have additional testing done to evaluate this.  Late 2016, he began experiencing right facial shooting paresthesias over the upper and lower lip especially with light tactile stimuli, chewing, brushing, and talking. He has exquisite sensitivity over the right upper lip.  He denies painful paresthesias over the forehead and cheek.  His hand tremors are well-controlled on inderal 60mg  and he has noticed that it is improved with alcohol.  He has chronic low back  pain and sees Dr. [name] for this and have been contemplating surgery.  He takes tramadol as needed.  He saw Dr. [name] for low back pain follow-up, but also discussed his trigeminal neuralgia who states that he is able to microvascular decompression surgery for this, if medications are ineffective.  He currently takes Lyrica 100mg  twice daily, because he is unable to tolerate extra dose in the afternoon (blurry vision, sedation).   Starting in 2018, his hand tremors started getting worse.  His propranolol was steadily increased to 220/d without benefit, so primidone was added and titrated to 150mg /d.  UPDATE 06/22/2018:  He is here for follow-up visit.  Tremors are still very bothersome, especially when eating, shaving, or holding a cup.  He takes propranolol 120mg  BID and primidone 50mg  in the morning and 100mg  at bedtime and is tolerating them well. Overall, he is not feeling well and reports having generalized fatigue and pain, especially low back and hips.  He saw Dr. [name] for his low back pain who prescribed his tramadol, which provides relief. He is scheduled to have redo root canal on the right and is taking antibiotics for this. Fortunately, he has not had any exacerbation of trigeminal neuraglia.    Medications:  Current Outpatient Medications on File Prior to Visit  Medication Sig Dispense Refill  . amoxicillin (AMOXIL) 500 MG capsule     . celecoxib (CELEBREX) 200 MG capsule TAKE 1 CAPSULE BY MOUTH EVERY DAY WITH FOOD  0  . lamoTRIgine (LAMICTAL) 200 MG tablet Take 200 mg by mouth at bedtime.  1  .  PROAIR HFA 108 (90 Base) MCG/ACT inhaler Inhale 2 puffs into the lungs every 6 (six) hours as needed.  1  . propranolol ER (INDERAL LA) 120 MG 24 hr capsule TAKE ONE CAPSULE BY MOUTH TWICE A DAY 180 capsule 1  . sildenafil (VIAGRA) 100 MG tablet Take 1 tablet (100 mg total) by mouth daily as needed for erectile dysfunction. 12 tablet 11  . traMADol (ULTRAM) 50 MG tablet Take 50 mg by mouth  2 (two) times daily as needed.    . zolpidem (AMBIEN) 10 MG tablet Take 10 mg by mouth at bedtime.  1   Current Facility-Administered Medications on File Prior to Visit  Medication Dose Route Frequency Provider Last Rate Last Dose  . 0.9 %  sodium chloride infusion  500 mL Intravenous Once Armbruster, Carlota Raspberry, MD        Allergies: No Known Allergies  Review of Systems:  CONSTITUTIONAL: No fevers, chills, night sweats, or weight loss.  EYES: No visual changes or eye pain ENT: No hearing changes.  No history of nose bleeds.   RESPIRATORY: No cough, wheezing and shortness of breath.   CARDIOVASCULAR: Negative for chest pain, and palpitations.   GI: Negative for abdominal discomfort, blood in stools or black stools.  No recent change in bowel habits.   GU:  No history of incontinence.   MUSCLOSKELETAL: +history of joint pain or swelling.  No myalgias.   SKIN: Negative for lesions, rash, and itching.   ENDOCRINE: Negative for cold or heat intolerance, polydipsia or goiter.   PSYCH:  +depression or anxiety symptoms.   NEURO: As Above.   Vital Signs:  BP 110/80   Pulse 68   Ht 5\' 10"  (1.778 m)   Wt 208 lb (94.3 kg)   SpO2 97%   BMI 29.84 kg/m   General Medical Exam:   General:  Well appearing, comfortable  Eyes/ENT: see cranial nerve examination.  Mild right lower jaw fullness/swelling. Neck:   Full range of motion without tenderness.  No carotid bruits. Respiratory:  Clear to auscultation, good air entry bilaterally.   Cardiac:  Regular rate and rhythm, no murmur.   Ext:  No deformity  Neurological Exam: MENTAL STATUS:  Intact to person, place, and time.   CRANIAL NERVES: Pupils round and reactive, extraocular muscles intact.  Mild left ptosis (old).   Face is symmetric.  MOTOR: Motor strength is 5/5 throughout.  Postural tremor the right of mild-moderate amplitude, worse with arms out-stretched and finger-to-nose testing. Trace tremor on the left hand.  Tone is  normal.  COORDINATION/GAIT:  Gait is narrow-based, stable.  Mildly reduced arm swing bilaterally.   Data: AChR blocking and binding 11/02/2012 - neg  CSF 06/02/2012: R0 W1 G63 P43 IgG index 0.43 OCB -neg  MRI brain wwo contrast 07/08/2009: 1. Advanced white matter disease in a pattern compatible with multiple sclerosis. Mild progression since 2004. No areas of  enhancement.  2. No acute intracranial abnormality.   IMPRESSION/PLAN: 1.  Benign essential tremors involving the hands  - Increase primidone to 50mg  in the morning and afternoon and 100mg  at bedtime  - Continue propranolol 120 mg twice daily   2.  Right trigeminal neuralgia, asymptomatic since 2016  - Previously tried gabapentin, tegretol, Lyrica  - He already takes lamictal 200mg  at bedtime as a mood stabilizer  3.  Chronic low back pain, followed by Dr. [name].  PT declined.  Return to clinic in 6 months   Thank you for allowing me to  participate in patient's care.  If I can answer any additional questions, I would be pleased to do so.    Sincerely,    Donika K. Posey Pronto, DO

## 2018-06-22 ENCOUNTER — Encounter: Payer: Self-pay | Admitting: Neurology

## 2018-06-22 ENCOUNTER — Ambulatory Visit (INDEPENDENT_AMBULATORY_CARE_PROVIDER_SITE_OTHER): Payer: Medicare HMO | Admitting: Neurology

## 2018-06-22 VITALS — BP 110/80 | HR 68 | Ht 70.0 in | Wt 208.0 lb

## 2018-06-22 DIAGNOSIS — G250 Essential tremor: Secondary | ICD-10-CM

## 2018-06-22 DIAGNOSIS — G8929 Other chronic pain: Secondary | ICD-10-CM

## 2018-06-22 DIAGNOSIS — G500 Trigeminal neuralgia: Secondary | ICD-10-CM | POA: Diagnosis not present

## 2018-06-22 DIAGNOSIS — M545 Low back pain: Secondary | ICD-10-CM | POA: Diagnosis not present

## 2018-06-22 MED ORDER — PRIMIDONE 50 MG PO TABS: ORAL_TABLET | ORAL | 3 refills | Status: DC

## 2018-06-22 NOTE — Patient Instructions (Addendum)
Increase primidone to 50mg  in the morning and afternoon and 100mg  at bedtime Continue propranolol 120 mg twice daily   Return to clinic in 6 months

## 2018-09-23 ENCOUNTER — Other Ambulatory Visit: Payer: Self-pay | Admitting: Family Medicine

## 2018-09-23 ENCOUNTER — Other Ambulatory Visit: Payer: Self-pay

## 2018-09-23 ENCOUNTER — Ambulatory Visit
Admission: RE | Admit: 2018-09-23 | Discharge: 2018-09-23 | Disposition: A | Discharge status: Home / Self Care | Payer: Medicare HMO | Source: Ambulatory Visit | Attending: Family Medicine | Admitting: Family Medicine

## 2018-09-23 DIAGNOSIS — R0781 Pleurodynia: Secondary | ICD-10-CM

## 2018-10-27 ENCOUNTER — Other Ambulatory Visit: Payer: Self-pay

## 2018-10-27 MED ORDER — PROPRANOLOL HCL ER 120 MG PO CP24: ORAL_CAPSULE | ORAL | 1 refills | Status: DC

## 2018-12-18 ENCOUNTER — Encounter: Payer: Self-pay | Admitting: Neurology

## 2018-12-21 ENCOUNTER — Other Ambulatory Visit: Payer: Self-pay

## 2018-12-21 ENCOUNTER — Telehealth (INDEPENDENT_AMBULATORY_CARE_PROVIDER_SITE_OTHER): Payer: Medicare HMO | Admitting: Neurology

## 2018-12-21 VITALS — Ht 70.0 in

## 2018-12-21 DIAGNOSIS — G500 Trigeminal neuralgia: Secondary | ICD-10-CM

## 2018-12-21 DIAGNOSIS — G250 Essential tremor: Secondary | ICD-10-CM

## 2018-12-21 MED ORDER — PRIMIDONE 50 MG PO TABS: 100.0000 mg | ORAL_TABLET | Freq: Two times a day (BID) | ORAL | 3 refills | Status: DC

## 2018-12-21 NOTE — Progress Notes (Signed)
    Virtual Visit via Telephone Note The purpose of this virtual visit is to provide medical care while limiting exposure to the novel coronavirus.    Consent was obtained for phone visit:  Yes.   Answered questions that patient had about telehealth interaction:  Yes.   I discussed the limitations, risks, security and privacy concerns of performing an evaluation and management service by telephone. I also discussed with the patient that there may be a patient responsible charge related to this service. The patient expressed understanding and agreed to proceed.  Pt location: Home Physician Location: office Name of referring provider:  Vernie Shanks, MD I connected with .Gabriel Richardson at patients initiation/request on 12/21/2018 at 10:30 AM EDT by telephone and verified that I am speaking with the correct person using two identifiers.  Pt MRN:  [id number] Pt DOB:  12-08-1945   History of Present Illness: This is a 73 year-old man returning for evaluation of tremors and trigeminal neuralgia.  He continues to have tremors which are better controlled on primidone 100mg  BID.  He is also on propranolol 120mg  twice daily.  He denies any problems with trigeminal neuralgia.  Overall, he feels in good health and symptoms are stable.  He had negative COVID testing several [place] ago.  He denies being symptomatic, but visits with his 79 year old father-in-law at a facility and wanted to take precautions.   Assessment and Plan:   1.  Benign essential tremors involving the hands, stable  - Continue primidone 100 mg twice daily - refills provided  - Continue propranolol 120 mg twice daily   2.  Right trigeminal neuralgia, asymptomatic since 2016  - Previously tried gabapentin, tegretol, Lyrica  - He already takes lamictal 200mg  at bedtime as a mood stabilizer  Follow Up Instructions:   I discussed the assessment and treatment plan with the patient. The patient was provided an opportunity to ask  questions and all were answered. The patient agreed with the plan and demonstrated an understanding of the instructions.   The patient was advised to call back or seek an in-person evaluation if the symptoms worsen or if the condition fails to improve as anticipated.  Return to clinic in 6 months  Total Time spent in visit with the patient was:  7 min, of which 100% of the time was spent in counseling and/or coordinating care.   Pt understands and agrees with the plan of care outlined.     Alda Berthold, DO

## 2018-12-26 ENCOUNTER — Encounter (HOSPITAL_COMMUNITY): Payer: Self-pay | Admitting: Emergency Medicine

## 2018-12-26 ENCOUNTER — Emergency Department (HOSPITAL_COMMUNITY): Payer: Medicare HMO

## 2018-12-26 ENCOUNTER — Emergency Department (HOSPITAL_COMMUNITY)
Admission: EM | Admit: 2018-12-26 | Discharge: 2018-12-26 | Disposition: A | Discharge status: Home / Self Care | Payer: Medicare HMO | Attending: Emergency Medicine | Admitting: Emergency Medicine

## 2018-12-26 ENCOUNTER — Other Ambulatory Visit: Payer: Self-pay

## 2018-12-26 DIAGNOSIS — S098XXA Other specified injuries of head, initial encounter: Secondary | ICD-10-CM | POA: Insufficient documentation

## 2018-12-26 DIAGNOSIS — Y929 Unspecified place or not applicable: Secondary | ICD-10-CM | POA: Diagnosis not present

## 2018-12-26 DIAGNOSIS — Z87891 Personal history of nicotine dependence: Secondary | ICD-10-CM | POA: Diagnosis not present

## 2018-12-26 DIAGNOSIS — I10 Essential (primary) hypertension: Secondary | ICD-10-CM | POA: Diagnosis not present

## 2018-12-26 DIAGNOSIS — Y9389 Activity, other specified: Secondary | ICD-10-CM | POA: Insufficient documentation

## 2018-12-26 DIAGNOSIS — S82899A Other fracture of unspecified lower leg, initial encounter for closed fracture: Secondary | ICD-10-CM

## 2018-12-26 DIAGNOSIS — Y999 Unspecified external cause status: Secondary | ICD-10-CM | POA: Diagnosis not present

## 2018-12-26 DIAGNOSIS — S99911A Unspecified injury of right ankle, initial encounter: Secondary | ICD-10-CM

## 2018-12-26 DIAGNOSIS — Z79899 Other long term (current) drug therapy: Secondary | ICD-10-CM | POA: Insufficient documentation

## 2018-12-26 DIAGNOSIS — S82841A Displaced bimalleolar fracture of right lower leg, initial encounter for closed fracture: Secondary | ICD-10-CM | POA: Diagnosis not present

## 2018-12-26 DIAGNOSIS — S99811A Other specified injuries of right ankle, initial encounter: Secondary | ICD-10-CM | POA: Diagnosis present

## 2018-12-26 DIAGNOSIS — G709 Myoneural disorder, unspecified: Secondary | ICD-10-CM | POA: Insufficient documentation

## 2018-12-26 DIAGNOSIS — J439 Emphysema, unspecified: Secondary | ICD-10-CM | POA: Insufficient documentation

## 2018-12-26 MED ORDER — ACETAMINOPHEN 500 MG PO TABS: 1000.0000 mg | ORAL_TABLET | Freq: Three times a day (TID) | ORAL | 0 refills | Status: DC

## 2018-12-26 MED ORDER — HYDROCODONE-ACETAMINOPHEN 5-325 MG PO TABS: 1.0000 | ORAL_TABLET | Freq: Three times a day (TID) | ORAL | 0 refills | Status: AC | PRN

## 2018-12-26 MED ORDER — HYDROCODONE-ACETAMINOPHEN 5-325 MG PO TABS: 1.0000 | ORAL_TABLET | Freq: Three times a day (TID) | ORAL | 0 refills | Status: DC | PRN

## 2018-12-26 MED ORDER — LIDOCAINE HCL 2 % IJ SOLN
10.0000 mL | Freq: Once | INTRAMUSCULAR | Status: AC
  Administered 2018-12-26: 200 mg
  Filled 2018-12-26: qty 20

## 2018-12-26 NOTE — ED Notes (Signed)
Pt requested to ambulate to the restroom. I asked to to stay seated and use a urinal. Pt significant other assisting him with urinal.

## 2018-12-26 NOTE — ED Triage Notes (Signed)
Pt arrived via [facility] with complaint of a fall resulting in right ankle swelling, and an abrasion on R knee. Ankle unstable. Pt also has abrasion on posterior portion of head. Pt stated ETOH on board and fell while getting out of a lifted truck.

## 2018-12-26 NOTE — ED Provider Notes (Signed)
[facility] DEPT Provider Note  CSN: [id number] Arrival date & time: 12/26/18 1950  Chief Complaint(s) Ankle Pain and Fall  HPI Gabriel Richardson is a 73 y.o. male here for fall and right ankle pain.  Patient and wife report that this occurred while trying to get in the car.  Reports that he was able to put his left foot inside the car and lost his balance causing him to fall to the ground.  Felt immediate right ankle pain and was unable to bear weight.  Pain was severe and throbbing in nature exacerbated with movement and palpation as well as weight bearing.  Alleviated by immobility.  Patient also sustained minor head trauma but denies any loss of consciousness or headache.  Patient is not anticoagulated.  He did endorse to drinking alcohol just prior to the incident.  Reports daily alcohol consumption.  Denies any neck pain, back pain, upper extremity pain, chest pain, abdominal pain, hip pain.  Denies any other physical complaints.  HPI  Past Medical History Past Medical History:  Diagnosis Date  . Anxiety   . Asthma   . Cervical stenosis of spinal canal   . COPD (chronic obstructive pulmonary disease) ([place])   . Depression   . Emphysema of lung ([place])   . Neuromuscular disorder [facility])    Patient Active Problem List   Diagnosis Date Noted  . Right trigeminal neuralgia 07/31/2015  . Benign essential tremor 07/31/2015  . Ptosis 12/14/2012  . Tremor 12/14/2012  . Depression 11/02/2012  . Asthma, chronic 11/02/2012  . HYPERTENSION, BENIGN 01/15/2009  . ABNORMAL ELECTROCARDIOGRAM 01/12/2009   Home Medication(s) Prior to Admission medications   Medication Sig Start Date End Date Taking? Authorizing Provider  celecoxib (CELEBREX) 200 MG capsule Take 200 mg by mouth daily.  12/30/16  Yes [provider]  lamoTRIgine (LAMICTAL) 200 MG tablet Take 200 mg by mouth at bedtime. 01/18/15  Yes [provider]  primidone (MYSOLINE) 50 MG tablet Take 2  tablets (100 mg total) by mouth 2 (two) times daily. 12/21/18  Yes Patel, Donika K, DO  propranolol ER (INDERAL LA) 120 MG 24 hr capsule TAKE ONE CAPSULE BY MOUTH TWICE A DAY Patient taking differently: Take 120 mg by mouth 2 (two) times daily.  10/27/18  Yes Patel, Donika K, DO  sildenafil (VIAGRA) 100 MG tablet Take 1 tablet (100 mg total) by mouth daily as needed for erectile dysfunction. 03/08/15  Yes Darlyne Russian, MD  traMADol (ULTRAM) 50 MG tablet Take 50 mg by mouth 2 (two) times daily as needed for moderate pain.  02/27/15  Yes [provider]  acetaminophen (TYLENOL) 500 MG tablet Take 2 tablets (1,000 mg total) by mouth every 8 (eight) hours for 5 days. Do not take more than 4000 mg of acetaminophen (Tylenol) in a 24-hour period. Please note that other medicines that you may be prescribed may have Tylenol as well. 12/26/18 12/31/18  Fatima Blank, MD  HYDROcodone-acetaminophen (NORCO/VICODIN) 5-325 MG tablet Take 1 tablet by mouth every 8 (eight) hours as needed for up to 5 days for severe pain (That is not improved by your scheduled Tylenol). 12/26/18 12/31/18  Fatima Blank, MD  PROAIR HFA 108 [phone] Base) MCG/ACT inhaler Inhale 2 puffs into the lungs every 6 (six) hours as needed for wheezing or shortness of breath.  06/16/17   [provider]  zolpidem (AMBIEN) 10 MG tablet Take 10 mg by mouth at bedtime. 07/19/15   [provider]                                                                                                                                    Past Surgical History Past Surgical History:  Procedure Laterality Date  . CERVICAL FUSION    . COLONOSCOPY  10 years ago    At [facility] exam"  . LUMBAR LAMINECTOMY    . SPINE SURGERY     Cervical C3-C5 anterior fusion   Family History Family History  Problem Relation Age of Onset  . Kidney disease Mother   . Breast cancer Mother        Living, 63  . Diabetes Mellitus II  Father        Deceased, 85  . Tremor Father   . Depression Daughter   . Healthy Brother   . Colon cancer Neg Hx     Social History Social History   Tobacco Use  . Smoking status: Former Smoker    Packs/day: 1.50    Years: 25.00    Pack years: 37.50    Types: Cigarettes    Quit date: 05/20/1997    Years since quitting: 21.6  . Smokeless tobacco: Never Used  Substance Use Topics  . Alcohol use: Yes    Alcohol/week: 2.0 standard drinks    Types: 2 Shots of liquor per week    Comment: 10 drinks per week per pt  . Drug use: No   Allergies Patient has no known allergies.  Review of Systems Review of Systems All other systems are reviewed and are negative for acute change except as noted in the HPI  Physical Exam Vital Signs  I have reviewed the triage vital signs BP 107/68 (BP Location: Left Arm)   Pulse 66   Temp 98.2 F (36.8 C) (Oral)   Resp 16   Ht 5\' 11"  (1.803 m)   Wt 91.2 kg   SpO2 97%   BMI 28.03 kg/m   Physical Exam Constitutional:      General: He is not in acute distress.    Appearance: He is well-developed. He is not diaphoretic.  HENT:     Head: Normocephalic.     Right Ear: External ear normal.     Left Ear: External ear normal.  Eyes:     General: No scleral icterus.       Right eye: No discharge.        Left eye: No discharge.     Conjunctiva/sclera: Conjunctivae normal.     Pupils: Pupils are equal, round, and reactive to light.  Neck:     Musculoskeletal: Normal range of motion and neck supple.  Cardiovascular:     Rate and Rhythm: Regular rhythm.     Pulses:          Radial pulses are 2+ on the right side and 2+ on the left side.  Dorsalis pedis pulses are 2+ on the right side and 2+ on the left side.     Heart sounds: Normal heart sounds. No murmur. No friction rub. No gallop.   Pulmonary:     Effort: Pulmonary effort is normal. No respiratory distress.     Breath sounds: Normal breath sounds. No stridor.  Abdominal:      General: There is no distension.     Palpations: Abdomen is soft.     Tenderness: There is no abdominal tenderness.  Musculoskeletal:     Right knee: He exhibits normal range of motion, no swelling, no deformity and no bony tenderness. No tenderness found.     Right ankle: He exhibits decreased range of motion, swelling and deformity. He exhibits normal pulse. Tenderness. Lateral malleolus, medial malleolus and proximal fibula tenderness found.     Cervical back: He exhibits no bony tenderness.     Thoracic back: He exhibits no bony tenderness.     Lumbar back: He exhibits no bony tenderness.       Legs:       Feet:     Comments: Clavicle stable. Chest stable to AP/Lat compression. Pelvis stable to Lat compression. No obvious extremity deformity. No chest or abdominal wall contusion.  Skin:    General: Skin is warm.  Neurological:     Mental Status: He is alert and oriented to person, place, and time.     GCS: GCS eye subscore is 4. GCS verbal subscore is 5. GCS motor subscore is 6.     Comments: Moving all extremities      ED Results and Treatments Labs (all labs ordered are listed, but only abnormal results are displayed) Labs Reviewed - No data to display                                                                                                                       EKG  EKG Interpretation  Date/Time:    Ventricular Rate:    PR Interval:    QRS Duration:   QT Interval:    QTC Calculation:   R Axis:     Text Interpretation:        Radiology Dg Tibia/fibula Right  Result Date: 12/26/2018 CLINICAL DATA:  Recent fall with ankle pain, initial encounter EXAM: RIGHT TIBIA AND FIBULA - 2 VIEW COMPARISON:  None. FINDINGS: Distal fibular fracture is again identified. The talus is posteriorly and laterally subluxed similar to that seen on prior ankle films. Small posterior malleolar fracture is noted as well. IMPRESSION: Distal tibial and fibular fractures with talar  subluxation. Electronically Signed   By: Inez Catalina M.D.   On: 12/26/2018 02:04   Dg Ankle Complete Right  Result Date: 12/26/2018 CLINICAL DATA:  Status post reduction and casting EXAM: RIGHT ANKLE - COMPLETE 3+ VIEW COMPARISON:  Films from earlier in the same day. FINDINGS: Casting material is now seen. Previously noted distal fibular fracture is again seen. The talar subluxation has been reduced  in the anterior posterior projection but remains laterally displaced with respect to the distal tibia. IMPRESSION: Status post reduction with some improvement in the degree of talar subluxation although lateral displacement remains with respect to the distal tibia. Electronically Signed   By: Inez Catalina M.D.   On: 12/26/2018 03:39   Dg Ankle Complete Right  Result Date: 12/26/2018 CLINICAL DATA:  Recent fall with ankle swelling EXAM: RIGHT ANKLE - COMPLETE 3+ VIEW COMPARISON:  None. FINDINGS: Oblique fracture is noted through the distal right fibula. Lateral and posterior subluxation of the talus is seen. Mild posterior malleolar fracture is noted as well. No other focal abnormality is seen. IMPRESSION: Bimalleolar fracture with lateral and posterior subluxation of the talus. Electronically Signed   By: Inez Catalina M.D.   On: 12/26/2018 02:03   Ct Head Wo Contrast  Result Date: 12/26/2018 CLINICAL DATA:  73 year old male with head trauma. EXAM: CT HEAD WITHOUT CONTRAST CT CERVICAL SPINE WITHOUT CONTRAST TECHNIQUE: Multidetector CT imaging of the head and cervical spine was performed following the standard protocol without intravenous contrast. Multiplanar CT image reconstructions of the cervical spine were also generated. COMPARISON:  Head CT report dated 07/30/2010. The images are not available for direct comparison. FINDINGS: CT HEAD FINDINGS Brain: There is moderate age-related atrophy and chronic microvascular ischemic changes. There is no acute intracranial hemorrhage. No mass effect or midline shift.  No extra-axial fluid collection. Vascular: No hyperdense vessel or unexpected calcification. High attenuating appearance of the intracranial vasculature, likely hemoconcentration/dehydration. Skull: Normal. Negative for fracture or focal lesion. Sinuses/Orbits: Diffuse mucoperiosteal thickening of paranasal sinuses with partial opacification and air-fluid level in the right maxillary sinus. The mastoid air cells are clear. Other: None CT CERVICAL SPINE FINDINGS Alignment: No acute subluxation. Skull base and vertebrae: No acute fracture. Osteopenia. Soft tissues and spinal canal: No prevertebral fluid or swelling. No visible canal hematoma. Disc levels: Anterior fusion at C4-C6. Multilevel degenerative changes with endplate irregularity and disc space narrowing. Upper chest: Negative. Other: Bilateral carotid bulb calcified plaques. IMPRESSION: 1. No acute intracranial hemorrhage. Moderate age-related atrophy and chronic microvascular ischemic changes. 2. No acute/traumatic cervical spine pathology. Electronically Signed   By: Anner Crete M.D.   On: 12/26/2018 02:26   Ct Cervical Spine Wo Contrast  Result Date: 12/26/2018 CLINICAL DATA:  73 year old male with head trauma. EXAM: CT HEAD WITHOUT CONTRAST CT CERVICAL SPINE WITHOUT CONTRAST TECHNIQUE: Multidetector CT imaging of the head and cervical spine was performed following the standard protocol without intravenous contrast. Multiplanar CT image reconstructions of the cervical spine were also generated. COMPARISON:  Head CT report dated 07/30/2010. The images are not available for direct comparison. FINDINGS: CT HEAD FINDINGS Brain: There is moderate age-related atrophy and chronic microvascular ischemic changes. There is no acute intracranial hemorrhage. No mass effect or midline shift. No extra-axial fluid collection. Vascular: No hyperdense vessel or unexpected calcification. High attenuating appearance of the intracranial vasculature, likely  hemoconcentration/dehydration. Skull: Normal. Negative for fracture or focal lesion. Sinuses/Orbits: Diffuse mucoperiosteal thickening of paranasal sinuses with partial opacification and air-fluid level in the right maxillary sinus. The mastoid air cells are clear. Other: None CT CERVICAL SPINE FINDINGS Alignment: No acute subluxation. Skull base and vertebrae: No acute fracture. Osteopenia. Soft tissues and spinal canal: No prevertebral fluid or swelling. No visible canal hematoma. Disc levels: Anterior fusion at C4-C6. Multilevel degenerative changes with endplate irregularity and disc space narrowing. Upper chest: Negative. Other: Bilateral carotid bulb calcified plaques. IMPRESSION: 1. No acute intracranial hemorrhage. Moderate  age-related atrophy and chronic microvascular ischemic changes. 2. No acute/traumatic cervical spine pathology. Electronically Signed   By: Anner Crete M.D.   On: 12/26/2018 02:26    Pertinent labs & imaging results that were available during my care of the patient were reviewed by me and considered in my medical decision making (see chart for details).  Medications Ordered in ED Medications  lidocaine (XYLOCAINE) 2 % (with pres) injection 200 mg (200 mg Other Given 12/26/18 0250)                                                                                                                                    Procedures .Ortho Injury Treatment  Date/Time: 12/26/2018 2:25 AM Performed by: Fatima Blank, MD Authorized by: Fatima Blank, MD   Consent:    Consent obtained:  Verbal and written   Consent given by:  Patient and spouse   Risks discussed:  Irreducible dislocation, recurrent dislocation, stiffness, restricted joint movement, vascular damage and nerve damage   Alternatives discussed:  ImmobilizationInjury location: ankle Location details: right ankle Injury type: fracture-dislocation Fracture type: bimalleolar Pre-procedure neurovascular  assessment: neurovascularly intact Anesthesia: hematoma block  Anesthesia: Local anesthesia used: yes Local Anesthetic: lidocaine 2% without epinephrine Anesthetic total: 10 mL Manipulation performed: yes Skeletal traction used: yes Reduction successful: yes X-ray confirmed reduction: yes Immobilization: splint Splint type: short leg Supplies used: Ortho-Glass Post-procedure neurovascular assessment: post-procedure neurovascularly intact Patient tolerance: patient tolerated the procedure well with no immediate complications    SPLINT APPLICATION Authorized by: Grayce Sessions Shogo Larkey Consent: Verbal consent obtained. Risks and benefits: risks, benefits and alternatives were discussed Consent given by: patient Splint applied by: myself and orthopedic technician Location details: right ankle Splint type: [place] Supplies used: orthoglass Post-procedure: The splinted body part was neurovascularly unchanged following the procedure. Patient tolerance: Patient tolerated the procedure well with no immediate complications.     (including critical care time)  Medical Decision Making / ED Course I have reviewed the nursing notes for this encounter and the patient's prior records (if available in EHR or on provided paperwork).   Gabriel Richardson was evaluated in Emergency Department on 12/26/2018 for the symptoms described in the history of present illness. He was evaluated in the context of the global COVID-19 pandemic, which necessitated consideration that the patient might be at risk for infection with the SARS-CoV-2 virus that causes COVID-19. Institutional protocols and algorithms that pertain to the evaluation of patients at risk for COVID-19 are in a state of rapid change based on information released by regulatory bodies including the CDC and federal and state organizations. These policies and algorithms were followed during the patient's care in the ED.  Mechanical fall resulting in  right ankle injury and head trauma in setting of EtOH.  CT head and cervical spine without acute injuries.  Plain film of the right lower extremity notable for bimalleolar fracture.   No  other injuries noted on exam requiring imaging or work-up.  Fracture dislocation was reduced with hematoma block.  Splint placed.  Provided with crutches.  Ortho follow-up, for definitive management and 3-5 days.      Final Clinical Impression(s) / ED Diagnoses Final diagnoses:  Right ankle injury, initial encounter  Bimalleolar ankle fracture, right, closed, initial encounter  Ankle fracture    The patient appears reasonably screened and/or stabilized for discharge and I doubt any other medical condition or other [facility] requiring further screening, evaluation, or treatment in the ED at this time prior to discharge.  Disposition: Discharge  Condition: Good  I have discussed the results, Dx and Tx plan with the patient who expressed understanding and agree(s) with the plan. Discharge instructions discussed at great length. The patient was given strict return precautions who verbalized understanding of the instructions. No further questions at time of discharge.    ED Discharge Orders         Ordered    acetaminophen (TYLENOL) 500 MG tablet  Every 8 hours     12/26/18 0419    HYDROcodone-acetaminophen (NORCO/VICODIN) 5-325 MG tablet  Every 8 hours PRN,   Status:  Discontinued     12/26/18 0419    HYDROcodone-acetaminophen (NORCO/VICODIN) 5-325 MG tablet  Every 8 hours PRN     12/26/18 0421          [facility] narcotic database reviewed and no active prescriptions noted.   Follow Up: [address] [place] [address] [phone]  Schedule an appointment as soon as possible for a visit  in 3-5 days, For close follow up to assess for right ankle fracture      This chart was dictated using voice recognition software.  Despite best efforts to proofread,   errors can occur which can change the documentation meaning.   Fatima Blank, MD 12/26/18 [phone]

## 2019-01-04 ENCOUNTER — Other Ambulatory Visit (HOSPITAL_COMMUNITY)
Admission: RE | Admit: 2019-01-04 | Discharge: 2019-01-04 | Disposition: A | Discharge status: Home / Self Care | Payer: Medicare HMO | Source: Ambulatory Visit | Attending: Orthopedic Surgery | Admitting: Orthopedic Surgery

## 2019-01-04 DIAGNOSIS — Z20828 Contact with and (suspected) exposure to other viral communicable diseases: Secondary | ICD-10-CM | POA: Diagnosis not present

## 2019-01-04 DIAGNOSIS — Z01812 Encounter for preprocedural laboratory examination: Secondary | ICD-10-CM | POA: Diagnosis present

## 2019-01-04 LAB — SARS CORONAVIRUS 2 (TAT 6-24 HRS): SARS Coronavirus 2: NEGATIVE

## 2019-01-06 ENCOUNTER — Encounter (HOSPITAL_COMMUNITY): Payer: Self-pay | Admitting: *Deleted

## 2019-01-06 ENCOUNTER — Other Ambulatory Visit: Payer: Self-pay

## 2019-01-06 NOTE — Progress Notes (Signed)
Patient denies shortness of breath, fever, cough and chest pain  PCP - Yaakov Guthrie at [facility] Cardiologist - denies Chest x-ray - (right side only on 09/23/18)  EKG - denies (EKG needed DOS per Karoline Caldwell, PA, Anesthesiology ) Stress Test - denies ECHO - denies Cardiac Cath - denies Sleep Study - denies CPAP - denies Fasting Blood Sugar - denies Blood Thinner Instructions: n/a Aspirin Instructions: n/a Pt made aware to stop taking Aspirin (unless otherwise advised by surgeon), vitamins, fish oil and herbal medications. Do not take any NSAIDs ie: Ibuprofen, Advil, Naproxen (Aleve), Motrin, BC and Goody Powder. Anesthesia review: Yes; nurse requested that PA, Anesthesiology, review cardiology note.  Coronavirus Screening Have you experienced the following symptoms:  Cough yes/no: No Fever (>100.94F)  yes/no: No Runny nose yes/no: No Sore throat yes/no: No Difficulty breathing/shortness of breath  yes/no: No  Have you or a family member traveled in the last 14 days and where? yes/no: No Pt reminded that hospital visitation restrictions are in effect and the importance of the restrictions.  Patient verbalized understanding of all pre-op instructions.

## 2019-01-06 NOTE — Anesthesia Preprocedure Evaluation (Addendum)
Anesthesia Evaluation  Patient identified by MRN, date of birth, ID band Patient awake    Reviewed: Allergy & Precautions, NPO status , Patient's Chart, lab work & pertinent test results, reviewed documented beta blocker date and time   History of Anesthesia Complications Negative for: history of anesthetic complications  Airway Mallampati: IV  TM Distance: >3 FB Neck ROM: Full  Mouth opening: Limited Mouth Opening  Dental  (+) Teeth Intact, Implants   Pulmonary asthma , COPD, former smoker,    Pulmonary exam normal        Cardiovascular hypertension, Pt. on medications and Pt. on home beta blockers Normal cardiovascular exam     Neuro/Psych PSYCHIATRIC DISORDERS Anxiety Depression Bipolar Disorder negative neurological ROS     GI/Hepatic negative GI ROS, Neg liver ROS,   Endo/Other  negative endocrine ROS  Renal/GU negative Renal ROS  negative genitourinary   Musculoskeletal negative musculoskeletal ROS (+)   Abdominal   Peds  Hematology negative hematology ROS (+)   Anesthesia Other Findings   Reproductive/Obstetrics                           Anesthesia Physical Anesthesia Plan  ASA: III  Anesthesia Plan: General   Post-op Pain Management: GA combined w/ Regional for post-op pain   Induction: Intravenous  PONV Risk Score and Plan: 2 and Ondansetron, Dexamethasone and Treatment may vary due to age or medical condition  Airway Management Planned: LMA  Additional Equipment: None  Intra-op Plan:   Post-operative Plan: Extubation in OR  Informed Consent: I have reviewed the patients History and Physical, chart, labs and discussed the procedure including the risks, benefits and alternatives for the proposed anesthesia with the patient or authorized representative who has indicated his/her understanding and acceptance.     Dental advisory given  Plan Discussed with:    Anesthesia Plan Comments: (Pt denies HTN, says he takes propanolol for essential tremor. Review of records in epic shows he was seen by cardiology in 2016 and 2018 for HTN and eval of abn EKG. EKG showed very small q waves inferiorly and per OV note did not warrant further evaluation due to pt being asymptomatic. He was advised to f/u PRN. Given history will repeat EKG DOS. )   Anesthesia Quick Evaluation

## 2019-01-07 ENCOUNTER — Encounter (HOSPITAL_COMMUNITY): Payer: Self-pay

## 2019-01-07 ENCOUNTER — Other Ambulatory Visit: Payer: Self-pay

## 2019-01-07 ENCOUNTER — Ambulatory Visit (HOSPITAL_COMMUNITY)
Admission: RE | Admit: 2019-01-07 | Discharge: 2019-01-07 | Disposition: A | Discharge status: Home / Self Care | Payer: Medicare HMO | Attending: Orthopedic Surgery | Admitting: Orthopedic Surgery

## 2019-01-07 ENCOUNTER — Ambulatory Visit (HOSPITAL_COMMUNITY): Payer: Medicare HMO | Admitting: Physician Assistant

## 2019-01-07 ENCOUNTER — Encounter (HOSPITAL_COMMUNITY)
Admission: RE | Disposition: A | Discharge status: Home / Self Care | Payer: Self-pay | Source: Home / Self Care | Attending: Orthopedic Surgery

## 2019-01-07 ENCOUNTER — Ambulatory Visit (HOSPITAL_COMMUNITY): Payer: Medicare HMO

## 2019-01-07 DIAGNOSIS — S93431A Sprain of tibiofibular ligament of right ankle, initial encounter: Secondary | ICD-10-CM | POA: Insufficient documentation

## 2019-01-07 DIAGNOSIS — S82831A Other fracture of upper and lower end of right fibula, initial encounter for closed fracture: Secondary | ICD-10-CM

## 2019-01-07 DIAGNOSIS — I10 Essential (primary) hypertension: Secondary | ICD-10-CM | POA: Insufficient documentation

## 2019-01-07 DIAGNOSIS — Z791 Long term (current) use of non-steroidal anti-inflammatories (NSAID): Secondary | ICD-10-CM | POA: Diagnosis not present

## 2019-01-07 DIAGNOSIS — Z419 Encounter for procedure for purposes other than remedying health state, unspecified: Secondary | ICD-10-CM

## 2019-01-07 DIAGNOSIS — Z79899 Other long term (current) drug therapy: Secondary | ICD-10-CM | POA: Insufficient documentation

## 2019-01-07 DIAGNOSIS — Z87891 Personal history of nicotine dependence: Secondary | ICD-10-CM | POA: Diagnosis not present

## 2019-01-07 DIAGNOSIS — J439 Emphysema, unspecified: Secondary | ICD-10-CM | POA: Insufficient documentation

## 2019-01-07 DIAGNOSIS — W19XXXA Unspecified fall, initial encounter: Secondary | ICD-10-CM | POA: Diagnosis not present

## 2019-01-07 DIAGNOSIS — S8261XA Displaced fracture of lateral malleolus of right fibula, initial encounter for closed fracture: Secondary | ICD-10-CM | POA: Insufficient documentation

## 2019-01-07 HISTORY — DX: Other fracture of unspecified lower leg, initial encounter for closed fracture: S82.899A

## 2019-01-07 HISTORY — DX: Unspecified osteoarthritis, unspecified site: M19.90

## 2019-01-07 HISTORY — DX: Presence of spectacles and contact lenses: Z97.3

## 2019-01-07 HISTORY — DX: Bipolar disorder, unspecified: F31.9

## 2019-01-07 HISTORY — DX: Trigeminal neuralgia: G50.0

## 2019-01-07 HISTORY — PX: ORIF ANKLE FRACTURE: SHX5408

## 2019-01-07 HISTORY — DX: Other fracture of upper and lower end of right fibula, initial encounter for closed fracture: S82.831A

## 2019-01-07 LAB — CBC
HCT: 42.8 % (ref 39.0–52.0)
Hemoglobin: 14.3 g/dL (ref 13.0–17.0)
MCH: 34.2 pg — ABNORMAL HIGH (ref 26.0–34.0)
MCHC: 33.4 g/dL (ref 30.0–36.0)
MCV: 102.4 fL — ABNORMAL HIGH (ref 80.0–100.0)
Platelets: 219 10*3/uL (ref 150–400)
RBC: 4.18 MIL/uL — ABNORMAL LOW (ref 4.22–5.81)
RDW: 13.5 % (ref 11.5–15.5)
WBC: 6.9 10*3/uL (ref 4.0–10.5)
nRBC: 0 % (ref 0.0–0.2)

## 2019-01-07 LAB — COMPREHENSIVE METABOLIC PANEL
ALT: 33 U/L (ref 0–44)
AST: 37 U/L (ref 15–41)
Albumin: 3.6 g/dL (ref 3.5–5.0)
Alkaline Phosphatase: 59 U/L (ref 38–126)
Anion gap: 11 (ref 5–15)
BUN: 10 mg/dL (ref 8–23)
CO2: 23 mmol/L (ref 22–32)
Calcium: 8.8 mg/dL — ABNORMAL LOW (ref 8.9–10.3)
Chloride: 105 mmol/L (ref 98–111)
Creatinine, Ser: 0.71 mg/dL (ref 0.61–1.24)
GFR calc Af Amer: >60 mL/min (ref >60)
GFR calc non Af Amer: >60 mL/min (ref >60)
Glucose, Bld: 108 mg/dL — ABNORMAL HIGH (ref 70–99)
Potassium: 4.1 mmol/L (ref 3.5–5.1)
Sodium: 139 mmol/L (ref 135–145)
Total Bilirubin: 1.1 mg/dL (ref 0.3–1.2)
Total Protein: 7.1 g/dL (ref 6.5–8.1)

## 2019-01-07 SURGERY — OPEN REDUCTION INTERNAL FIXATION (ORIF) ANKLE FRACTURE
Anesthesia: General | Site: Ankle | Laterality: Right

## 2019-01-07 MED ORDER — MIDAZOLAM HCL 2 MG/2ML IJ SOLN
INTRAMUSCULAR | Status: AC
  Administered 2019-01-07: 2 mg via INTRAVENOUS
  Filled 2019-01-07: qty 2

## 2019-01-07 MED ORDER — OXYCODONE HCL 5 MG/5ML PO SOLN: 5.0000 mg | Freq: Once | ORAL | Status: DC | PRN

## 2019-01-07 MED ORDER — LABETALOL HCL 5 MG/ML IV SOLN
INTRAVENOUS | Status: AC
  Filled 2019-01-07: qty 4

## 2019-01-07 MED ORDER — ONDANSETRON 4 MG PO TBDP: 4.0000 mg | ORAL_TABLET | Freq: Three times a day (TID) | ORAL | 0 refills | Status: DC | PRN

## 2019-01-07 MED ORDER — LABETALOL HCL 5 MG/ML IV SOLN
INTRAVENOUS | Status: DC | PRN
  Administered 2019-01-07 (×2): 5 mg via INTRAVENOUS

## 2019-01-07 MED ORDER — MIDAZOLAM HCL 2 MG/2ML IJ SOLN
2.0000 mg | Freq: Once | INTRAMUSCULAR | Status: AC
  Administered 2019-01-07: 2 mg via INTRAVENOUS

## 2019-01-07 MED ORDER — PROPOFOL 10 MG/ML IV BOLUS
INTRAVENOUS | Status: DC | PRN
  Administered 2019-01-07: 50 mg via INTRAVENOUS
  Administered 2019-01-07: 150 mg via INTRAVENOUS

## 2019-01-07 MED ORDER — SODIUM CHLORIDE 0.9 % IV SOLN
INTRAVENOUS | Status: DC | PRN
  Administered 2019-01-07: 40 ug/min via INTRAVENOUS

## 2019-01-07 MED ORDER — 0.9 % SODIUM CHLORIDE (POUR BTL) OPTIME
TOPICAL | Status: DC | PRN
  Administered 2019-01-07: 1000 mL

## 2019-01-07 MED ORDER — LIDOCAINE 2% (20 MG/ML) 5 ML SYRINGE
INTRAMUSCULAR | Status: DC | PRN
  Administered 2019-01-07: 100 mg via INTRAVENOUS

## 2019-01-07 MED ORDER — ROPIVACAINE HCL 5 MG/ML IJ SOLN
INTRAMUSCULAR | Status: DC | PRN
  Administered 2019-01-07 (×2): 20 mL via PERINEURAL

## 2019-01-07 MED ORDER — FENTANYL CITRATE (PF) 100 MCG/2ML IJ SOLN
100.0000 ug | Freq: Once | INTRAMUSCULAR | Status: AC
  Administered 2019-01-07: 100 ug via INTRAVENOUS

## 2019-01-07 MED ORDER — CHLORHEXIDINE GLUCONATE 4 % EX LIQD: 60.0000 mL | Freq: Once | CUTANEOUS | Status: DC

## 2019-01-07 MED ORDER — FENTANYL CITRATE (PF) 100 MCG/2ML IJ SOLN: 25.0000 ug | INTRAMUSCULAR | Status: DC | PRN

## 2019-01-07 MED ORDER — CEFAZOLIN SODIUM-DEXTROSE 2-4 GM/100ML-% IV SOLN
2.0000 g | INTRAVENOUS | Status: AC
  Administered 2019-01-07: 2 g via INTRAVENOUS

## 2019-01-07 MED ORDER — LACTATED RINGERS IV SOLN
INTRAVENOUS | Status: DC
  Administered 2019-01-07: 11:00:00 via INTRAVENOUS

## 2019-01-07 MED ORDER — OXYCODONE HCL 5 MG PO TABS: 5.0000 mg | ORAL_TABLET | Freq: Once | ORAL | Status: DC | PRN

## 2019-01-07 MED ORDER — CEFAZOLIN SODIUM-DEXTROSE 2-4 GM/100ML-% IV SOLN
INTRAVENOUS | Status: AC
  Filled 2019-01-07: qty 100

## 2019-01-07 MED ORDER — DEXAMETHASONE SODIUM PHOSPHATE 10 MG/ML IJ SOLN
INTRAMUSCULAR | Status: DC | PRN
  Administered 2019-01-07: 5 mg
  Administered 2019-01-07: 4 mg via INTRAVENOUS

## 2019-01-07 MED ORDER — OXYCODONE HCL 5 MG PO TABS: 5.0000 mg | ORAL_TABLET | Freq: Four times a day (QID) | ORAL | 0 refills | Status: DC | PRN

## 2019-01-07 MED ORDER — ONDANSETRON HCL 4 MG/2ML IJ SOLN: 4.0000 mg | Freq: Once | INTRAMUSCULAR | Status: DC | PRN

## 2019-01-07 MED ORDER — FENTANYL CITRATE (PF) 100 MCG/2ML IJ SOLN
INTRAMUSCULAR | Status: AC
  Administered 2019-01-07: 100 ug via INTRAVENOUS
  Filled 2019-01-07: qty 2

## 2019-01-07 SURGICAL SUPPLY — 66 items
ALCOHOL 70% 16 OZ (MISCELLANEOUS) ×2 IMPLANT
BANDAGE ESMARK 6X9 LF (GAUZE/BANDAGES/DRESSINGS) IMPLANT
BIT DRILL 2 CANN GRADUATED (BIT) ×1 IMPLANT
BIT DRILL 2.5 CANN LNG (BIT) ×1 IMPLANT
BIT DRILL 2.7 (BIT) ×2
BIT DRILL 2.7X2.7/3XSCR ANKL (BIT) IMPLANT
BIT DRL 2.7X2.7/3XSCR ANKL (BIT) ×1
BNDG CMPR 9X6 STRL LF SNTH (GAUZE/BANDAGES/DRESSINGS)
BNDG COHESIVE 4X5 TAN STRL (GAUZE/BANDAGES/DRESSINGS) ×2 IMPLANT
BNDG ELASTIC 4X5.8 VLCR STR LF (GAUZE/BANDAGES/DRESSINGS) ×1 IMPLANT
BNDG ELASTIC 6X5.8 VLCR STR LF (GAUZE/BANDAGES/DRESSINGS) ×1 IMPLANT
BNDG ESMARK 6X9 LF (GAUZE/BANDAGES/DRESSINGS)
CANISTER SUCT 3000ML PPV (MISCELLANEOUS) ×2 IMPLANT
COVER SURGICAL LIGHT HANDLE (MISCELLANEOUS) ×2 IMPLANT
COVER WAND RF STERILE (DRAPES) ×2 IMPLANT
CUFF TOURN SGL QUICK 34 (TOURNIQUET CUFF)
CUFF TRNQT CYL 34X4.125X (TOURNIQUET CUFF) IMPLANT
DRAPE C-ARM 42X72 X-RAY (DRAPES) ×2 IMPLANT
DRAPE C-ARMOR (DRAPES) ×2 IMPLANT
DRAPE U-SHAPE 47X51 STRL (DRAPES) ×2 IMPLANT
DRSG ADAPTIC 3X8 NADH LF (GAUZE/BANDAGES/DRESSINGS) ×2 IMPLANT
DRSG PAD ABDOMINAL 8X10 ST (GAUZE/BANDAGES/DRESSINGS) ×2 IMPLANT
DURAPREP 26ML APPLICATOR (WOUND CARE) ×2 IMPLANT
ELECT REM PT RETURN 9FT ADLT (ELECTROSURGICAL) ×2
ELECTRODE REM PT RTRN 9FT ADLT (ELECTROSURGICAL) ×1 IMPLANT
GAUZE SPONGE 4X4 12PLY STRL (GAUZE/BANDAGES/DRESSINGS) ×2 IMPLANT
GAUZE SPONGE 4X4 12PLY STRL LF (GAUZE/BANDAGES/DRESSINGS) ×1 IMPLANT
GLOVE BIO SURGEON STRL SZ7.5 (GLOVE) ×2 IMPLANT
GLOVE BIOGEL PI IND STRL 8 (GLOVE) ×1 IMPLANT
GLOVE BIOGEL PI INDICATOR 8 (GLOVE) ×1
GOWN STRL REUS W/ TWL LRG LVL3 (GOWN DISPOSABLE) ×2 IMPLANT
GOWN STRL REUS W/ TWL XL LVL3 (GOWN DISPOSABLE) ×1 IMPLANT
GOWN STRL REUS W/TWL LRG LVL3 (GOWN DISPOSABLE) ×4
GOWN STRL REUS W/TWL XL LVL3 (GOWN DISPOSABLE) ×2
IMPL TIGHTROP W/DRV K-LESS (Anchor) IMPLANT
IMPLANT TIGHTROPE W/DRV K-LESS (Anchor) ×2 IMPLANT
KIT BASIN OR (CUSTOM PROCEDURE TRAY) ×2 IMPLANT
KIT TURNOVER KIT B (KITS) ×2 IMPLANT
NDL HYPO 25GX1X1/2 BEV (NEEDLE) IMPLANT
NEEDLE HYPO 25GX1X1/2 BEV (NEEDLE) IMPLANT
NS IRRIG 1000ML POUR BTL (IV SOLUTION) ×2 IMPLANT
PACK ORTHO EXTREMITY (CUSTOM PROCEDURE TRAY) ×2 IMPLANT
PAD ARMBOARD 7.5X6 YLW CONV (MISCELLANEOUS) ×4 IMPLANT
PAD CAST 4YDX4 CTTN HI CHSV (CAST SUPPLIES) ×2 IMPLANT
PADDING CAST COTTON 4X4 STRL (CAST SUPPLIES) ×2
PADDING CAST COTTON 6X4 STRL (CAST SUPPLIES) ×2 IMPLANT
PLATE LOCK DIST FIB 7H (Plate) ×1 IMPLANT
SCREW BN T10 FT 20X2.7XST CORT (Screw) IMPLANT
SCREW CANC T15 FT 16X4XST (Screw) IMPLANT
SCREW CANCELLOUS 4.0X16MM (Screw) ×2 IMPLANT
SCREW CANCELLOUS LP 4.0X18 (Screw) ×1 IMPLANT
SCREW CORT 2.7X20 (Screw) ×2 IMPLANT
SCREW LOW PROFILE 3.5X14 (Screw) ×3 IMPLANT
SPLINT FIBERGLASS 4X30 (CAST SUPPLIES) ×1 IMPLANT
SPONGE LAP 18X18 RF (DISPOSABLE) IMPLANT
SUCTION FRAZIER HANDLE 10FR (MISCELLANEOUS) ×1
SUCTION TUBE FRAZIER 10FR DISP (MISCELLANEOUS) ×1 IMPLANT
SUT ETHILON 3 0 PS 1 (SUTURE) ×2 IMPLANT
SUT VIC AB 0 CT1 27 (SUTURE)
SUT VIC AB 0 CT1 27XBRD ANBCTR (SUTURE) IMPLANT
SUT VIC AB 2-0 CT1 27 (SUTURE) ×2
SUT VIC AB 2-0 CT1 TAPERPNT 27 (SUTURE) ×1 IMPLANT
SYR CONTROL 10ML LL (SYRINGE) IMPLANT
TOWEL GREEN STERILE (TOWEL DISPOSABLE) ×4 IMPLANT
TUBE CONNECTING 12X1/4 (SUCTIONS) ×2 IMPLANT
YANKAUER SUCT BULB TIP NO VENT (SUCTIONS) ×2 IMPLANT

## 2019-01-07 NOTE — Transfer of Care (Signed)
Immediate Anesthesia Transfer of Care Note  Patient: Gabriel Richardson  Procedure(s) Performed: Right ankle open reduction internal fixation with syndesmosis fixation (Right Ankle)  Patient Location: PACU  Anesthesia Type:GA combined with regional for post-op pain  Level of Consciousness: awake  Airway & Oxygen Therapy: Patient Spontanous Breathing and Patient connected to nasal cannula oxygen  Post-op Assessment: Report given to RN and Post -op Vital signs reviewed and stable  Post vital signs: Reviewed and stable  Last Vitals:  Vitals Value Taken Time  BP 141/86 01/07/19 1224  Temp    Pulse 65 01/07/19 1225  Resp 15 01/07/19 1225  SpO2 99 % 01/07/19 1225  Vitals shown include unvalidated device data.  Last Pain:  Vitals:   01/07/19 1005  PainSc: 0-No pain         Complications: No apparent anesthesia complications

## 2019-01-07 NOTE — Brief Op Note (Signed)
01/07/2019  11:58 AM  PATIENT:  Gabriel Richardson  73 y.o. male  PRE-OPERATIVE DIAGNOSIS:  Right ankle fracture  POST-OPERATIVE DIAGNOSIS:  Right ankle fracture  PROCEDURE:  Procedure(s) with comments: Right ankle open reduction internal fixation with syndesmosis fixation (Right) - 90 mins  SURGEON:  Surgeon(s) and Role:    * Stann Mainland, Elly Modena, MD - Primary  PHYSICIAN ASSISTANT:   ASSISTANTS: Katy Apo, RNFA   ANESTHESIA:   regional and general  EBL:  20 cc  BLOOD ADMINISTERED:none  DRAINS: none   LOCAL MEDICATIONS USED:  NONE  SPECIMEN:  No Specimen  DISPOSITION OF SPECIMEN:  N/A  COUNTS:  YES  TOURNIQUET:  300 mm Hg for 65 minutes  DICTATION: .Note written in [place]: Discharge to home after PACU  PATIENT DISPOSITION:  PACU - hemodynamically stable.   Delay start of Pharmacological VTE agent (>24hrs) due to surgical blood loss or risk of bleeding: not applicable

## 2019-01-07 NOTE — Anesthesia Procedure Notes (Signed)
Anesthesia Regional Block: Popliteal block   Pre-Anesthetic Checklist: ,, timeout performed, Correct Patient, Correct Site, Correct Laterality, Correct Procedure, Correct Position, site marked, Risks and benefits discussed,  Surgical consent,  Pre-op evaluation,  At surgeon's request and post-op pain management  Laterality: Right  Prep: chloraprep       Needles:  Injection technique: Single-shot  Needle Type: Echogenic Stimulator Needle     Needle Length: 10cm  Needle Gauge: 21     Additional Needles:   Procedures:,,,, ultrasound used (permanent image in chart),,,,  Narrative:  Start time: 01/07/2019 9:49 AM End time: 01/07/2019 9:53 AM Injection made incrementally with aspirations every 5 mL.  Performed by: Personally  Anesthesiologist: Lidia Collum, MD  Additional Notes: Monitors applied. Injection made in 5cc increments. No resistance to injection. Good needle visualization. Patient tolerated procedure well.

## 2019-01-07 NOTE — Anesthesia Procedure Notes (Signed)
Anesthesia Regional Block: Adductor canal block   Pre-Anesthetic Checklist: ,, timeout performed, Correct Patient, Correct Site, Correct Laterality, Correct Procedure, Correct Position, site marked, Risks and benefits discussed,  Surgical consent,  Pre-op evaluation,  At surgeon's request and post-op pain management  Laterality: Right  Prep: chloraprep       Needles:  Injection technique: Single-shot  Needle Type: Echogenic Stimulator Needle     Needle Length: 10cm  Needle Gauge: 21     Additional Needles:   Procedures:,,,, ultrasound used (permanent image in chart),,,,  Narrative:  Start time: 01/07/2019 9:45 AM End time: 01/07/2019 9:49 AM Injection made incrementally with aspirations every 5 mL.  Performed by: Personally  Anesthesiologist: Lidia Collum, MD  Additional Notes: Monitors applied. Injection made in 5cc increments. No resistance to injection. Good needle visualization. Patient tolerated procedure well.

## 2019-01-07 NOTE — H&P (Signed)
ORTHOPAEDIC H and P  REQUESTING PHYSICIAN: Nicholes Stairs, MD  PCP:  Vernie Shanks, MD  Chief Complaint: Right ankle fracture  HPI: Gabriel Richardson is a 73 y.o. male who complains of right ankle pain and deformity following a fall last week.  He presented to the office with a bimalleolar equivalent fracture.  He is here today for open reduction and internal fixation.  No new complaints today.  He has been elevating and nonweightbearing.  Past Medical History:  Diagnosis Date  . Ankle fracture    right with syndesmotic disruption  . Anxiety   . Arthritis   . Asthma   . Bipolar disorder ([place])   . Cervical stenosis of spinal canal   . COPD (chronic obstructive pulmonary disease) ([place])   . Depression   . Emphysema of lung ([place])   . Neuromuscular disorder ([place])   . Trigeminal neuralgia   . Wears glasses    Past Surgical History:  Procedure Laterality Date  . CERVICAL FUSION    . COLONOSCOPY  10 years ago    At [facility] exam"  . LUMBAR LAMINECTOMY    . SPINE SURGERY     Cervical C3-C5 anterior fusion   Social History   Socioeconomic History  . Marital status: Married    Spouse name: [name]  . Number of children: 2  . Years of education: BS  . Highest education level: Not on file  Occupational History    Employer: [employer]     Comment: Works for himself  Social Needs  . Financial resource strain: Not on file  . Food insecurity    Worry: Not on file    Inability: Not on file  . Transportation needs    Medical: Not on file    Non-medical: Not on file  Tobacco Use  . Smoking status: Former Smoker    Packs/day: 1.50    Years: 25.00    Pack years: 37.50    Types: Cigarettes    Quit date: 05/20/1997    Years since quitting: 21.6  . Smokeless tobacco: Never Used  Substance and Sexual Activity  . Alcohol use: Yes    Alcohol/week: 2.0 standard drinks    Types: 2 Shots of liquor per week    Comment: 14 drinks per week per pt  . Drug  use: No  . Sexual activity: Yes    Birth control/protection: None  Lifestyle  . Physical activity    Days per week: Not on file    Minutes per session: Not on file  . Stress: Not on file  Relationships  . Social Herbalist on phone: Not on file    Gets together: Not on file    Attends religious service: Not on file    Active member of club or organization: Not on file    Attends meetings of clubs or organizations: Not on file    Relationship status: Not on file  Other Topics Concern  . Not on file  Social History Narrative   He currently works and Freight forwarder.    One story      Patient lives at home with his wife [name] in a one-story home.  Patient has a college education. B.S. Patient is self employed.  They have two grown daughters.   Right handed.   Caffeine- one cup   Family History  Problem Relation Age of Onset  . Kidney disease Mother   . Breast cancer Mother  Living, 84  . Diabetes Mellitus II Father        Deceased, 24  . Tremor Father   . Depression Daughter   . Healthy Brother   . Colon cancer Neg Hx    No Known Allergies Prior to Admission medications   Medication Sig Start Date End Date Taking? Authorizing Provider  acetaminophen (TYLENOL) 500 MG tablet Take 1,000 mg by mouth every 6 (six) hours as needed for mild pain or headache.   Yes [provider]  celecoxib (CELEBREX) 200 MG capsule Take 200 mg by mouth daily.  12/30/16  Yes [provider]  lamoTRIgine (LAMICTAL) 200 MG tablet Take 200 mg by mouth at bedtime. 01/18/15  Yes [provider]  primidone (MYSOLINE) 50 MG tablet Take 2 tablets (100 mg total) by mouth 2 (two) times daily. 12/21/18  Yes Patel, Donika K, DO  propranolol ER (INDERAL LA) 120 MG 24 hr capsule TAKE ONE CAPSULE BY MOUTH TWICE A DAY Patient taking differently: Take 120 mg by mouth 2 (two) times daily.  10/27/18  Yes Patel, Donika K, DO  zolpidem (AMBIEN) 10 MG tablet Take 10 mg by  mouth at bedtime. 07/19/15  Yes [provider]  sildenafil (VIAGRA) 100 MG tablet Take 1 tablet (100 mg total) by mouth daily as needed for erectile dysfunction. 03/08/15   Darlyne Russian, MD  traMADol (ULTRAM) 50 MG tablet Take 50 mg by mouth 2 (two) times daily as needed for moderate pain.  02/27/15   [provider]   No results found.  Positive ROS: All other systems have been reviewed and were otherwise negative with the exception of those mentioned in the HPI and as above.  Physical Exam: General: Alert, no acute distress Cardiovascular: No pedal edema Respiratory: No cyanosis, no use of accessory musculature GI: No organomegaly, abdomen is soft and non-tender Skin: No lesions in the area of chief complaint Neurologic: Sensation intact distally Psychiatric: Patient is competent for consent with normal mood and affect Lymphatic: No axillary or cervical lymphadenopathy  MUSCULOSKELETAL:  Right leg:  Short leg splint in place.  This is in good repair.  His skin is warm and well-perfused with capillary refill less than 2 seconds.  He can wiggle his toes.  Assessment: 1.  Right ankle distal fibula fracture 2.  Right ankle distal tibiofibular joint disruption (syndesmosis).  Plan: -Plan for open reduction and internal fixation of the distal fibula with probable syndesmotic fixation.  We discussed this previously in the office.  He will discharge home from PACU postoperatively.  He will be nonweightbearing for 8 [place].  -The risks, benefits, and alternatives were discussed with the patient. There are risks associated with the surgery including, but not limited to, problems with anesthesia (death), infection, differences in leg length/angulation/rotation, fracture of bones, loosening or failure of implants, malunion, nonunion, hematoma (blood accumulation) which may require surgical drainage, blood clots, pulmonary embolism, nerve injury (foot drop), and blood vessel  injury. The patient understands these risks and elects to proceed.     Nicholes Stairs, MD Cell [phone]    01/07/2019 10:30 AM

## 2019-01-07 NOTE — Anesthesia Procedure Notes (Addendum)
Procedure Name: LMA Insertion Performed by: Milford Cage, CRNA Pre-anesthesia Checklist: Patient identified, Emergency Drugs available, Suction available and Patient being monitored Patient Re-evaluated:Patient Re-evaluated prior to induction Oxygen Delivery Method: Circle System Utilized Preoxygenation: Pre-oxygenation with 100% oxygen Induction Type: IV induction Ventilation: Mask ventilation without difficulty LMA: LMA inserted LMA Size: 4.0 Number of attempts: 2 Airway Equipment and Method: Bite block Placement Confirmation: positive ETCO2 Tube secured with: Tape Dental Injury: Teeth and Oropharynx as per pre-operative assessment  Comments: LMA 5 wouldn't seat properly. Small amount of blood from upper gum. No dental damage noted

## 2019-01-07 NOTE — Discharge Instructions (Signed)
Ortho Discharge:  - No weight bearing to the right lower extremity  - elevate right leg with 'toes above nose.'  - maintain postop splint until follow up appointment.  Keep clean and dry  - for mild to moderate pain take tylenol and or advil and for breakthrough pain use oxycodone  -For the prevention of blood clots take an 81 mg aspirin once per day for 6 Melfi.  - return to see Dr. Stann Mainland in 2 [place]ence.

## 2019-01-07 NOTE — Anesthesia Postprocedure Evaluation (Signed)
Anesthesia Post Note  Patient: Gabriel Richardson  Procedure(s) Performed: Right ankle open reduction internal fixation with syndesmosis fixation (Right Ankle)     Patient location during evaluation: PACU Anesthesia Type: General Level of consciousness: awake and alert Pain management: pain level controlled Vital Signs Assessment: post-procedure vital signs reviewed and stable Respiratory status: spontaneous breathing, nonlabored ventilation and respiratory function stable Cardiovascular status: blood pressure returned to baseline and stable Postop Assessment: no apparent nausea or vomiting Anesthetic complications: no    Last Vitals:  Vitals:   01/07/19 1307 01/07/19 1308  BP:  (!) 146/74  Pulse: 62   Resp: 12   Temp:    SpO2: 96%     Last Pain:  Vitals:   01/07/19 1305  PainSc: 0-No pain                 Lidia Collum

## 2019-01-07 NOTE — Op Note (Signed)
Date of Surgery: 01/07/2019  INDICATIONS: [name] is a 73 y.o.-year-old male who sustained a right ankle fracture, lateral malleolus with syndesmosis disruption; he was indicated for open reduction and internal fixation due to the displaced nature of the articular fracture and came to the operating room today for this procedure. The patient did consent to the procedure after discussion of the risks and benefits.  PREOPERATIVE DIAGNOSIS: 1. right ankle fracture, distal fibula 2.  Right ankle distal syndesmosis disruption  POSTOPERATIVE DIAGNOSIS: Same.  PROCEDURE: 1. Open treatment of right ankle fracture with internal fixation. Lateral malleolar CPT [id number] 2. Right ankle open treatment with internal fixation of distal tibio-fibular joint (syndesmosis fixation)  SURGEON:  Gabriel Richardson, M.D.  ASSIST: Gabriel Richardson, RNFA.  ANESTHESIA:  general, and regional  TOURNIQUET TIME: 65 min. @ 300 mm Hg  IV FLUIDS AND URINE: See anesthesia.  ESTIMATED BLOOD LOSS: 20 mL.  IMPLANTS:  Arthrex 7 hole distal fibula plate with nonlocking cortical and cancellous screws 2.7 mm interfragment screw One tightrope device, Arthrex  COMPLICATIONS: None.  DESCRIPTION OF PROCEDURE: The patient was brought to the operating room and placed supine on the operating table.  The patient had been signed prior to the procedure and this was documented. The patient had the anesthesia placed by the anesthesiologist.  A nonsterile tourniquet was placed on the upper thigh.  The prep verification and incision time-outs were performed to confirm that this was the correct patient, site, side and location. The patient had an SCD on the opposite lower extremity. The patient did receive antibiotics prior to the incision and was re-dosed during the procedure as needed at indicated intervals.  The patient had the lower extremity prepped and draped in the standard surgical fashion.  The extremity was exsanguinated using an  esmarch bandage and the tourniquet was inflated to 300 mm Hg.   Incision was made over the distal fibula and the fracture was exposed and reduced anatomically with a clamp. A lag screw was placed. I then applied a 7 hole locking plate and secured it proximally and distally with non-locking screws. Bone quality was good. I used c-arm to confirm satisfactory reduction and fixation.     The syndesmosis was stressed using live fluoroscopy and found to be grossly unstable stable.  We then moved to perform the internal fixation for the distal tibiofibular joint.  We placed to the ankle on a well-padded bump so that the heel was not contacting the table.  We then placed the ankle joint itself in neutral dorsiflexion.  The tight rope drill was drilled just posterior to the plate across all 4 cortices of the fibula and tibia.  This was done parallel to the joint surface.  We then assured nice reduction of the medial clear space.  Next, we placed the tight rope device across all 4 cortices.  The Endobutton was flipped along the medial cortex of the tibia.  We then tightened this utilizing the two #5 FiberWire's through the device.  The free ends were then cut flush with the bone.  We stressed the syndesmosis which was stable on repeat examination.  We then performed AP, mortise, and lateral radiography intraoperatively to confirm the adequate reduction as well as placement of the plate and screws.  The wounds were irrigated, and closed with vicryl with 3-0 nylon, routine closure for the skin. The wounds were injected with local anesthetic. Sterile gauze was applied followed by a posterior splint. He was awakened and returned to the  PACU in stable and satisfactory condition. There were no complications.  All counts were correct x2.  There were no noted complications awaking from general anesthesia.  POSTOPERATIVE PLAN: Gabriel Richardson will remain nonweightbearing on this leg for approximately 8 [place]; Gabriel Richardson will  return for suture removal in 2 Batts.  He will be immobilized in a short leg splint and then transitioned to a CAM walker at his first follow up appointment.  Gabriel Richardson will receive DVT prophylaxis based on other medications, activity level, and risk ratio of bleeding to thrombosis.  Gabriel Rile, MD [facility] [place] [phone] 11:59 AM

## 2019-01-12 ENCOUNTER — Encounter (HOSPITAL_COMMUNITY): Payer: Self-pay | Admitting: Orthopedic Surgery

## 2019-01-13 ENCOUNTER — Encounter (HOSPITAL_COMMUNITY): Payer: Self-pay | Admitting: Orthopedic Surgery

## 2019-02-26 ENCOUNTER — Encounter: Payer: Self-pay | Admitting: Neurology

## 2019-04-21 DIAGNOSIS — M25571 Pain in right ankle and joints of right foot: Secondary | ICD-10-CM | POA: Diagnosis not present

## 2019-04-23 DIAGNOSIS — M25571 Pain in right ankle and joints of right foot: Secondary | ICD-10-CM | POA: Diagnosis not present

## 2019-04-26 ENCOUNTER — Other Ambulatory Visit: Payer: Self-pay | Admitting: Neurology

## 2019-04-26 DIAGNOSIS — M25571 Pain in right ankle and joints of right foot: Secondary | ICD-10-CM | POA: Diagnosis not present

## 2019-04-29 DIAGNOSIS — M25571 Pain in right ankle and joints of right foot: Secondary | ICD-10-CM | POA: Diagnosis not present

## 2019-05-07 DIAGNOSIS — M25571 Pain in right ankle and joints of right foot: Secondary | ICD-10-CM | POA: Diagnosis not present

## 2019-05-17 ENCOUNTER — Telehealth: Payer: Self-pay | Admitting: Neurology

## 2019-05-17 ENCOUNTER — Other Ambulatory Visit: Payer: Self-pay

## 2019-05-17 NOTE — Progress Notes (Signed)
Pt notified to contact his PCP

## 2019-05-17 NOTE — Telephone Encounter (Signed)
Please advise him to contact his pharmacy, that med has not been given here since 2017 thanks

## 2019-05-17 NOTE — Telephone Encounter (Signed)
Patient is needing a refill on the trigeminal neuralgia medication- Lyrica to his new pharm (please update in system) Gabriel Richardson on [address]. He said he was taking 50mg  a day and went up to 100mg  a day for 90 days. Thanks!

## 2019-05-17 NOTE — Telephone Encounter (Signed)
Patient left msg with after hours about the renewal of the lyrica medication. Would like to know the dosage that was last called in. Need dosage and frequency. Thanks!

## 2019-05-17 NOTE — Progress Notes (Signed)
Lyrica not on patient's current medication list (it was last prescribed in 2017).  My last note indicates he is no longer taking this.

## 2019-05-18 DIAGNOSIS — M25571 Pain in right ankle and joints of right foot: Secondary | ICD-10-CM | POA: Diagnosis not present

## 2019-05-25 DIAGNOSIS — G500 Trigeminal neuralgia: Secondary | ICD-10-CM | POA: Diagnosis not present

## 2019-05-26 DIAGNOSIS — M25571 Pain in right ankle and joints of right foot: Secondary | ICD-10-CM | POA: Diagnosis not present

## 2019-05-31 DIAGNOSIS — F319 Bipolar disorder, unspecified: Secondary | ICD-10-CM | POA: Diagnosis not present

## 2019-06-01 DIAGNOSIS — F329 Major depressive disorder, single episode, unspecified: Secondary | ICD-10-CM | POA: Diagnosis not present

## 2019-06-01 DIAGNOSIS — J45909 Unspecified asthma, uncomplicated: Secondary | ICD-10-CM | POA: Diagnosis not present

## 2019-06-01 DIAGNOSIS — J449 Chronic obstructive pulmonary disease, unspecified: Secondary | ICD-10-CM | POA: Diagnosis not present

## 2019-06-14 ENCOUNTER — Encounter: Payer: Self-pay | Admitting: Neurology

## 2019-06-16 NOTE — Progress Notes (Signed)
   Due to the COVID-19 crisis, this virtual check-in visit was done via telephone from my office and it was initiated and consent given by this patient and or family.   Telephone (Audio) Visit The purpose of this telephone visit is to provide medical care while limiting exposure to the novel coronavirus.    Consent was obtained for telephone visit and initiated by pt/family:  Yes.   Answered questions that patient had about telehealth interaction:  Yes.   I discussed the limitations, risks, security and privacy concerns of performing an evaluation and management service by telephone. I also discussed with the patient that there may be a patient responsible charge related to this service. The patient expressed understanding and agreed to proceed.  Pt location: Home Physician Location: office Name of referring provider:  Vernie Shanks, MD I connected with .Gabriel Richardson at patients initiation/request on 06/17/2019 at  8:50 AM EST by telephone and verified that I am speaking with the correct person using two identifiers.  Pt MRN:  [id number] Pt DOB:  08-19-1945   History of Present Illness: This 74 year-old man returning for evaluation of tremors.  He also has history of right trigeminal neuralgia which has been well-controlled for the past few years.  With respect to his tremors, they are mostly well-controlled on primidone 100mg  BID and propranolol 120mg  BID.  He has a few days in the week when his hands shake more and can be bothersome.  He suffered a fall a few months ago and fractured his right ankle, which required surgery and is making recovery from this. Otherwise, he has been well and recently received his first COVID19 vaccine.   BP 135/80  HR 72  Assessment and Plan:   1. Benign essential tremors involving the hands, overall stable with intermittent spells of worsening tremor.   - Continue primidone 100mg  BID  - Continue propranolol 120mg  BID  2.  Right trigeminal neuralgia,  stable since 2016  - Previously tried:  Gabapentin, tegretol, Lyrica  - He takes lamictal 200mg  at bedtime as a mood stabilizer   Follow Up Instructions:   I discussed the assessment and treatment plan with the patient. The patient was provided an opportunity to ask questions and all were answered. The patient agreed with the plan and demonstrated an understanding of the instructions.   The patient was advised to call back or seek an in-person evaluation if the symptoms worsen or if the condition fails to improve as anticipated.  Return to clinic in 9 months  Total Time spent in visit with the patient was:  11 min, of which 100% of the time was spent in counseling and/or coordinating care.   Pt understands and agrees with the plan of care outlined.     Alda Berthold, DO

## 2019-06-17 ENCOUNTER — Telehealth (INDEPENDENT_AMBULATORY_CARE_PROVIDER_SITE_OTHER): Payer: Medicare HMO | Admitting: Neurology

## 2019-06-17 ENCOUNTER — Other Ambulatory Visit: Payer: Self-pay

## 2019-06-17 VITALS — Ht 70.0 in | Wt 202.0 lb

## 2019-06-17 DIAGNOSIS — G500 Trigeminal neuralgia: Secondary | ICD-10-CM

## 2019-06-17 DIAGNOSIS — G250 Essential tremor: Secondary | ICD-10-CM

## 2019-06-17 MED ORDER — PROPRANOLOL HCL ER 120 MG PO CP24: 120.0000 mg | ORAL_CAPSULE | Freq: Two times a day (BID) | ORAL | 3 refills | Status: DC

## 2019-06-23 ENCOUNTER — Ambulatory Visit: Payer: Medicare HMO | Admitting: Neurology

## 2019-06-24 ENCOUNTER — Telehealth: Payer: Medicare HMO | Admitting: Neurology

## 2019-07-05 DIAGNOSIS — I10 Essential (primary) hypertension: Secondary | ICD-10-CM | POA: Diagnosis not present

## 2019-07-05 DIAGNOSIS — H524 Presbyopia: Secondary | ICD-10-CM | POA: Diagnosis not present

## 2019-07-05 DIAGNOSIS — E7800 Pure hypercholesterolemia, unspecified: Secondary | ICD-10-CM | POA: Diagnosis not present

## 2019-07-05 DIAGNOSIS — H35033 Hypertensive retinopathy, bilateral: Secondary | ICD-10-CM | POA: Diagnosis not present

## 2019-07-12 ENCOUNTER — Telehealth: Payer: Self-pay | Admitting: Neurology

## 2019-07-12 MED ORDER — PREGABALIN 75 MG PO CAPS: 75.0000 mg | ORAL_CAPSULE | Freq: Three times a day (TID) | ORAL | 5 refills | Status: DC

## 2019-07-12 NOTE — Telephone Encounter (Signed)
I will send Rx for Lyrica 75mg  three times daily, hopefully this will help settle his pain.

## 2019-07-12 NOTE — Telephone Encounter (Signed)
Patient was on Lyrica in the past and is taking a old prescription of 50 mg bid right now and would like to take it more often. He would like a prescription sent in and to be taken more often or a higher dosage. He said "when it strikes he would rather be dead" please advise.

## 2019-07-12 NOTE — Telephone Encounter (Signed)
Patient is in a lot of pain and wants to talk to someone about lyrica medication.

## 2019-07-13 NOTE — Telephone Encounter (Signed)
Pt is aware and has no questions or concerns at this time  

## 2019-07-28 DIAGNOSIS — L821 Other seborrheic keratosis: Secondary | ICD-10-CM | POA: Diagnosis not present

## 2019-08-24 DIAGNOSIS — S99921A Unspecified injury of right foot, initial encounter: Secondary | ICD-10-CM | POA: Diagnosis not present

## 2019-08-24 DIAGNOSIS — S92911A Unspecified fracture of right toe(s), initial encounter for closed fracture: Secondary | ICD-10-CM | POA: Diagnosis not present

## 2019-08-29 DIAGNOSIS — W19XXXA Unspecified fall, initial encounter: Secondary | ICD-10-CM | POA: Diagnosis not present

## 2019-08-29 DIAGNOSIS — S20212A Contusion of left front wall of thorax, initial encounter: Secondary | ICD-10-CM | POA: Diagnosis not present

## 2019-09-08 DIAGNOSIS — J449 Chronic obstructive pulmonary disease, unspecified: Secondary | ICD-10-CM | POA: Diagnosis not present

## 2019-09-08 DIAGNOSIS — J45909 Unspecified asthma, uncomplicated: Secondary | ICD-10-CM | POA: Diagnosis not present

## 2019-09-08 DIAGNOSIS — F329 Major depressive disorder, single episode, unspecified: Secondary | ICD-10-CM | POA: Diagnosis not present

## 2019-09-16 DIAGNOSIS — G8929 Other chronic pain: Secondary | ICD-10-CM | POA: Diagnosis not present

## 2019-09-16 DIAGNOSIS — S82891S Other fracture of right lower leg, sequela: Secondary | ICD-10-CM | POA: Diagnosis not present

## 2019-09-16 DIAGNOSIS — M25471 Effusion, right ankle: Secondary | ICD-10-CM | POA: Diagnosis not present

## 2019-09-16 DIAGNOSIS — S2232XS Fracture of one rib, left side, sequela: Secondary | ICD-10-CM | POA: Diagnosis not present

## 2019-10-05 DIAGNOSIS — L280 Lichen simplex chronicus: Secondary | ICD-10-CM | POA: Diagnosis not present

## 2019-10-05 DIAGNOSIS — D692 Other nonthrombocytopenic purpura: Secondary | ICD-10-CM | POA: Diagnosis not present

## 2019-10-05 DIAGNOSIS — L821 Other seborrheic keratosis: Secondary | ICD-10-CM | POA: Diagnosis not present

## 2019-10-05 DIAGNOSIS — B360 Pityriasis versicolor: Secondary | ICD-10-CM | POA: Diagnosis not present

## 2019-10-12 DIAGNOSIS — M79674 Pain in right toe(s): Secondary | ICD-10-CM | POA: Diagnosis not present

## 2019-10-12 DIAGNOSIS — M25571 Pain in right ankle and joints of right foot: Secondary | ICD-10-CM | POA: Diagnosis not present

## 2019-10-12 DIAGNOSIS — S92411A Displaced fracture of proximal phalanx of right great toe, initial encounter for closed fracture: Secondary | ICD-10-CM | POA: Diagnosis not present

## 2019-10-25 DIAGNOSIS — M5416 Radiculopathy, lumbar region: Secondary | ICD-10-CM | POA: Diagnosis not present

## 2019-10-25 DIAGNOSIS — M5440 Lumbago with sciatica, unspecified side: Secondary | ICD-10-CM | POA: Diagnosis not present

## 2019-10-25 DIAGNOSIS — M5136 Other intervertebral disc degeneration, lumbar region: Secondary | ICD-10-CM | POA: Diagnosis not present

## 2019-10-25 DIAGNOSIS — M4310 Spondylolisthesis, site unspecified: Secondary | ICD-10-CM | POA: Diagnosis not present

## 2019-10-25 DIAGNOSIS — M48062 Spinal stenosis, lumbar region with neurogenic claudication: Secondary | ICD-10-CM | POA: Diagnosis not present

## 2019-10-25 DIAGNOSIS — M4726 Other spondylosis with radiculopathy, lumbar region: Secondary | ICD-10-CM | POA: Diagnosis not present

## 2019-11-04 DIAGNOSIS — J449 Chronic obstructive pulmonary disease, unspecified: Secondary | ICD-10-CM | POA: Diagnosis not present

## 2019-11-04 DIAGNOSIS — J45909 Unspecified asthma, uncomplicated: Secondary | ICD-10-CM | POA: Diagnosis not present

## 2019-11-04 DIAGNOSIS — F329 Major depressive disorder, single episode, unspecified: Secondary | ICD-10-CM | POA: Diagnosis not present

## 2019-11-16 DIAGNOSIS — R6882 Decreased libido: Secondary | ICD-10-CM | POA: Diagnosis not present

## 2019-11-18 DIAGNOSIS — R6882 Decreased libido: Secondary | ICD-10-CM | POA: Diagnosis not present

## 2019-12-12 DIAGNOSIS — S0181XA Laceration without foreign body of other part of head, initial encounter: Secondary | ICD-10-CM | POA: Diagnosis not present

## 2019-12-16 DIAGNOSIS — S01511A Laceration without foreign body of lip, initial encounter: Secondary | ICD-10-CM | POA: Diagnosis not present

## 2019-12-16 DIAGNOSIS — W0110XA Fall on same level from slipping, tripping and stumbling with subsequent striking against unspecified object, initial encounter: Secondary | ICD-10-CM | POA: Diagnosis not present

## 2019-12-17 DIAGNOSIS — S60212D Contusion of left wrist, subsequent encounter: Secondary | ICD-10-CM | POA: Diagnosis not present

## 2020-01-02 ENCOUNTER — Other Ambulatory Visit: Payer: Self-pay | Admitting: Neurology

## 2020-01-11 DIAGNOSIS — J449 Chronic obstructive pulmonary disease, unspecified: Secondary | ICD-10-CM | POA: Diagnosis not present

## 2020-01-11 DIAGNOSIS — F329 Major depressive disorder, single episode, unspecified: Secondary | ICD-10-CM | POA: Diagnosis not present

## 2020-01-11 DIAGNOSIS — J45909 Unspecified asthma, uncomplicated: Secondary | ICD-10-CM | POA: Diagnosis not present

## 2020-02-03 DIAGNOSIS — F329 Major depressive disorder, single episode, unspecified: Secondary | ICD-10-CM | POA: Diagnosis not present

## 2020-02-03 DIAGNOSIS — J45909 Unspecified asthma, uncomplicated: Secondary | ICD-10-CM | POA: Diagnosis not present

## 2020-02-03 DIAGNOSIS — R739 Hyperglycemia, unspecified: Secondary | ICD-10-CM | POA: Diagnosis not present

## 2020-02-03 DIAGNOSIS — E669 Obesity, unspecified: Secondary | ICD-10-CM | POA: Diagnosis not present

## 2020-02-03 DIAGNOSIS — J449 Chronic obstructive pulmonary disease, unspecified: Secondary | ICD-10-CM | POA: Diagnosis not present

## 2020-02-03 DIAGNOSIS — G250 Essential tremor: Secondary | ICD-10-CM | POA: Diagnosis not present

## 2020-02-03 DIAGNOSIS — Z5181 Encounter for therapeutic drug level monitoring: Secondary | ICD-10-CM | POA: Diagnosis not present

## 2020-02-07 DIAGNOSIS — F319 Bipolar disorder, unspecified: Secondary | ICD-10-CM | POA: Diagnosis not present

## 2020-02-10 DIAGNOSIS — J449 Chronic obstructive pulmonary disease, unspecified: Secondary | ICD-10-CM | POA: Diagnosis not present

## 2020-02-10 DIAGNOSIS — J45909 Unspecified asthma, uncomplicated: Secondary | ICD-10-CM | POA: Diagnosis not present

## 2020-02-10 DIAGNOSIS — F329 Major depressive disorder, single episode, unspecified: Secondary | ICD-10-CM | POA: Diagnosis not present

## 2020-02-15 DIAGNOSIS — W19XXXA Unspecified fall, initial encounter: Secondary | ICD-10-CM | POA: Diagnosis not present

## 2020-02-15 DIAGNOSIS — M545 Low back pain: Secondary | ICD-10-CM | POA: Diagnosis not present

## 2020-02-15 DIAGNOSIS — S2241XA Multiple fractures of ribs, right side, initial encounter for closed fracture: Secondary | ICD-10-CM | POA: Diagnosis not present

## 2020-02-15 DIAGNOSIS — S299XXA Unspecified injury of thorax, initial encounter: Secondary | ICD-10-CM | POA: Diagnosis not present

## 2020-03-01 DIAGNOSIS — J449 Chronic obstructive pulmonary disease, unspecified: Secondary | ICD-10-CM | POA: Diagnosis not present

## 2020-03-01 DIAGNOSIS — F329 Major depressive disorder, single episode, unspecified: Secondary | ICD-10-CM | POA: Diagnosis not present

## 2020-03-01 DIAGNOSIS — J45909 Unspecified asthma, uncomplicated: Secondary | ICD-10-CM | POA: Diagnosis not present

## 2020-03-17 ENCOUNTER — Other Ambulatory Visit: Payer: Self-pay

## 2020-03-17 ENCOUNTER — Ambulatory Visit: Payer: Medicare HMO | Admitting: Neurology

## 2020-03-17 ENCOUNTER — Encounter: Payer: Self-pay | Admitting: Neurology

## 2020-03-17 VITALS — BP 145/82 | HR 79 | Ht 69.0 in | Wt 203.0 lb

## 2020-03-17 DIAGNOSIS — G250 Essential tremor: Secondary | ICD-10-CM | POA: Diagnosis not present

## 2020-03-17 DIAGNOSIS — G500 Trigeminal neuralgia: Secondary | ICD-10-CM

## 2020-03-17 MED ORDER — PROPRANOLOL HCL ER 120 MG PO CP24: 120.0000 mg | ORAL_CAPSULE | Freq: Two times a day (BID) | ORAL | 3 refills | Status: DC

## 2020-03-17 MED ORDER — PRIMIDONE 50 MG PO TABS: ORAL_TABLET | ORAL | 3 refills | Status: DC

## 2020-03-17 NOTE — Progress Notes (Signed)
Follow-up Visit   Date: 03/17/20    [name] MRN: [id number] DOB: 08-Feb-1946   Interim History: Gabriel Richardson is a 74 y.o. right-handed Caucasian male with history of depression, asthma, hypertension, and intention tremor returning to the clinic with essential tremor.  The patient was accompanied to the clinic by self.  History of present illness: Initial visit 04/30/2013:  He had previously been a patient of Dr. [name] since 1995, initially for right trigeminal neuralgia which he had frequent flares of since 1997 - early 2000. He has tried trileptal, neurontin, and lyrica and had the most relief with Lyrica. He has not had a flare since 2000, until 2016.    Around 2012, he started developing muscle cramps and left hand tremors and was concerned about multiple sclerosis. Although there was white matter changes on his MRI, Dr. [name] did not find that his exam and imaging was consistent with demyelinating disease. He also had CSF testing in January 2014 which did not show any inflammatory changes. He treated him symptomatically and was started on propranolol 60mg  which has helped with his tremors. He takes robaxin 500mg  twice daily as needed which also resolves his muscle cramps.  He is s/p anterior cervical fusion by Dr. [name] in 2010 and has history of left L5 radiculopathy status post L5-S1 laminectomy.  He saw Dr. [name] in July 2016 for new left ptosis. Myasthenia antibodies were checked and was normal. He declined to have additional testing done to evaluate this.  Late 2016, he began experiencing right facial shooting paresthesias over the upper and lower lip especially with light tactile stimuli, chewing, brushing, and talking. He has exquisite sensitivity over the right upper lip.  He denies painful paresthesias over the forehead and cheek.  His hand tremors are well-controlled on inderal 60mg  and he has noticed that it is improved with alcohol.  He has chronic low back  pain and sees Dr. [name] for this and have been contemplating surgery.  He takes tramadol as needed.  He saw Dr. [name] for low back pain follow-up, but also discussed his trigeminal neuralgia who states that he is able to microvascular decompression surgery for this, if medications are ineffective.  He currently takes Lyrica 100mg  twice daily, because he is unable to tolerate extra dose in the afternoon (blurry vision, sedation).   Starting in 2018, his hand tremors started getting worse.  His propranolol was steadily increased to 220/d without benefit, so primidone was added and titrated to 150mg /d.  UPDATE 03/17/2020:  He is here for follow-up visit.  Tremors are well-controlled on medication and he is tolerating primidone 100mg  BID and propranolol 120mg  BID well.  When tremors are bothersome, he takes an extra propranolol about once per week.  He has two falls over the past year. No issues with right trigeminal neurlagia and has not been taking Lyrica.  No interval illness or hospitalizations.    Medications:  Current Outpatient Medications on File Prior to Visit  Medication Sig Dispense Refill  . acetaminophen (TYLENOL) 500 MG tablet Take 1,000 mg by mouth every 6 (six) hours as needed for mild pain or headache.    . celecoxib (CELEBREX) 200 MG capsule Take 200 mg by mouth daily.   0  . lamoTRIgine (LAMICTAL) 200 MG tablet Take 200 mg by mouth at bedtime.  1  . ondansetron (ZOFRAN ODT) 4 MG disintegrating tablet Take 1 tablet (4 mg total) by mouth every 8 (eight) hours as needed. 20 tablet  0  . pregabalin (LYRICA) 75 MG capsule Take 1 capsule (75 mg total) by mouth 3 (three) times daily. 90 capsule 5  . primidone (MYSOLINE) 50 MG tablet TAKE TWO TABLETS (100MG ) BY MOUTH TWICE DAILY 360 tablet 0  . propranolol ER (INDERAL LA) 120 MG 24 hr capsule Take 1 capsule (120 mg total) by mouth 2 (two) times daily. OK to take extra tablet as needed for worsening tremor. 200 capsule 3  . sildenafil  (VIAGRA) 100 MG tablet Take 1 tablet (100 mg total) by mouth daily as needed for erectile dysfunction. 12 tablet 11  . traMADol (ULTRAM) 50 MG tablet Take 50 mg by mouth 2 (two) times daily as needed for moderate pain.     Marland Kitchen zolpidem (AMBIEN) 10 MG tablet Take 10 mg by mouth at bedtime.  1   No current facility-administered medications on file prior to visit.    Allergies: No Known Allergies  Vital Signs:  BP (!) 145/82   Pulse 79   Ht 5\' 9"  (1.753 m)   Wt 203 lb (92.1 kg)   SpO2 94%   BMI 29.98 kg/m    Neurological Exam: MENTAL STATUS:  Intact to person, place, and time. Speech is clear.    CRANIAL NERVES: Extraocular muscles intact.  Mild left ptosis (old).    MOTOR: Motor strength is 5/5 throughout.  Postural tremor the right hand, worse with arms out-stretched and finger-to-nose testing. Trace tremor on the left hand.  Tone is normal.  COORDINATION/GAIT:  Normal finger tapping. Gait is narrow-based, stable.  Mildly reduced arm swing bilaterally.   Data: AChR blocking and binding 11/02/2012 - neg  CSF 06/02/2012: R0 W1 G63 P43 IgG index 0.43 OCB -neg  MRI brain wwo contrast 07/08/2009: 1. Advanced white matter disease in a pattern compatible with multiple sclerosis. Mild progression since 2004. No areas of  enhancement.  2. No acute intracranial abnormality.   IMPRESSION/PLAN: 1.  Benign essential tremor, stable  - Continue primidone 100mg  BID  - Continue propranolol 120mg   BID.  OK to take extra dose as needed  2.  Right trigeminal neuralgia, asymptomatic  - Previously tried:  Gabapentin, tegretol, Lyrica  - He takes lamictal 200mg  at bedtime as a mood stabilizer  Return to clinic in 1 year  Total time spent reviewing records, interview, history/exam, documentation, and coordination of care on day of encounter:  20 min     Thank you for allowing me to participate in patient's care.  If I can answer any additional questions, I would be pleased to do so.     Sincerely,    Arty Lantzy K. Posey Pronto, DO

## 2020-03-17 NOTE — Patient Instructions (Signed)
Stay on the medications as you are taking  Return to clinic in 1 year

## 2020-04-03 DIAGNOSIS — J449 Chronic obstructive pulmonary disease, unspecified: Secondary | ICD-10-CM | POA: Diagnosis not present

## 2020-04-03 DIAGNOSIS — G8929 Other chronic pain: Secondary | ICD-10-CM | POA: Diagnosis not present

## 2020-04-03 DIAGNOSIS — F329 Major depressive disorder, single episode, unspecified: Secondary | ICD-10-CM | POA: Diagnosis not present

## 2020-04-03 DIAGNOSIS — G4700 Insomnia, unspecified: Secondary | ICD-10-CM | POA: Diagnosis not present

## 2020-04-03 DIAGNOSIS — J45909 Unspecified asthma, uncomplicated: Secondary | ICD-10-CM | POA: Diagnosis not present

## 2020-04-18 DIAGNOSIS — G8929 Other chronic pain: Secondary | ICD-10-CM | POA: Diagnosis not present

## 2020-04-18 DIAGNOSIS — R29898 Other symptoms and signs involving the musculoskeletal system: Secondary | ICD-10-CM | POA: Diagnosis not present

## 2020-04-18 DIAGNOSIS — M5442 Lumbago with sciatica, left side: Secondary | ICD-10-CM | POA: Diagnosis not present

## 2020-04-18 DIAGNOSIS — M5441 Lumbago with sciatica, right side: Secondary | ICD-10-CM | POA: Diagnosis not present

## 2020-04-18 DIAGNOSIS — M4807 Spinal stenosis, lumbosacral region: Secondary | ICD-10-CM | POA: Diagnosis not present

## 2020-04-20 DIAGNOSIS — M5440 Lumbago with sciatica, unspecified side: Secondary | ICD-10-CM | POA: Diagnosis not present

## 2020-04-20 DIAGNOSIS — Z6828 Body mass index (BMI) 28.0-28.9, adult: Secondary | ICD-10-CM | POA: Diagnosis not present

## 2020-04-20 DIAGNOSIS — M4726 Other spondylosis with radiculopathy, lumbar region: Secondary | ICD-10-CM | POA: Diagnosis not present

## 2020-04-20 DIAGNOSIS — M5412 Radiculopathy, cervical region: Secondary | ICD-10-CM | POA: Diagnosis not present

## 2020-04-20 DIAGNOSIS — M5416 Radiculopathy, lumbar region: Secondary | ICD-10-CM | POA: Diagnosis not present

## 2020-04-20 DIAGNOSIS — M4310 Spondylolisthesis, site unspecified: Secondary | ICD-10-CM | POA: Diagnosis not present

## 2020-04-20 DIAGNOSIS — M48062 Spinal stenosis, lumbar region with neurogenic claudication: Secondary | ICD-10-CM | POA: Diagnosis not present

## 2020-04-20 DIAGNOSIS — R030 Elevated blood-pressure reading, without diagnosis of hypertension: Secondary | ICD-10-CM | POA: Diagnosis not present

## 2020-04-21 ENCOUNTER — Other Ambulatory Visit: Payer: Self-pay | Admitting: Neurosurgery

## 2020-04-21 ENCOUNTER — Other Ambulatory Visit: Payer: Self-pay

## 2020-04-21 ENCOUNTER — Ambulatory Visit
Admission: RE | Admit: 2020-04-21 | Discharge: 2020-04-21 | Disposition: A | Discharge status: Home / Self Care | Payer: Medicare HMO | Source: Ambulatory Visit | Attending: Neurosurgery | Admitting: Neurosurgery

## 2020-04-21 DIAGNOSIS — M5126 Other intervertebral disc displacement, lumbar region: Secondary | ICD-10-CM | POA: Diagnosis not present

## 2020-04-21 DIAGNOSIS — M5416 Radiculopathy, lumbar region: Secondary | ICD-10-CM

## 2020-04-21 MED ORDER — GADOBENATE DIMEGLUMINE 529 MG/ML IV SOLN
19.0000 mL | Freq: Once | INTRAVENOUS | Status: AC | PRN
  Administered 2020-04-21: 19 mL via INTRAVENOUS

## 2020-04-26 DIAGNOSIS — R29898 Other symptoms and signs involving the musculoskeletal system: Secondary | ICD-10-CM | POA: Diagnosis not present

## 2020-04-26 DIAGNOSIS — Z6829 Body mass index (BMI) 29.0-29.9, adult: Secondary | ICD-10-CM | POA: Diagnosis not present

## 2020-04-26 DIAGNOSIS — R030 Elevated blood-pressure reading, without diagnosis of hypertension: Secondary | ICD-10-CM | POA: Diagnosis not present

## 2020-04-26 DIAGNOSIS — M5412 Radiculopathy, cervical region: Secondary | ICD-10-CM | POA: Diagnosis not present

## 2020-04-28 ENCOUNTER — Other Ambulatory Visit: Payer: Self-pay | Admitting: Neurosurgery

## 2020-04-28 DIAGNOSIS — R29898 Other symptoms and signs involving the musculoskeletal system: Secondary | ICD-10-CM

## 2020-04-29 ENCOUNTER — Other Ambulatory Visit: Payer: Self-pay

## 2020-04-29 ENCOUNTER — Ambulatory Visit
Admission: RE | Admit: 2020-04-29 | Discharge: 2020-04-29 | Disposition: A | Discharge status: Home / Self Care | Payer: Medicare HMO | Source: Ambulatory Visit | Attending: Neurosurgery | Admitting: Neurosurgery

## 2020-04-29 DIAGNOSIS — M4802 Spinal stenosis, cervical region: Secondary | ICD-10-CM | POA: Diagnosis not present

## 2020-04-29 DIAGNOSIS — R29898 Other symptoms and signs involving the musculoskeletal system: Secondary | ICD-10-CM

## 2020-05-01 DIAGNOSIS — J449 Chronic obstructive pulmonary disease, unspecified: Secondary | ICD-10-CM | POA: Diagnosis not present

## 2020-05-01 DIAGNOSIS — G4700 Insomnia, unspecified: Secondary | ICD-10-CM | POA: Diagnosis not present

## 2020-05-01 DIAGNOSIS — G8929 Other chronic pain: Secondary | ICD-10-CM | POA: Diagnosis not present

## 2020-05-01 DIAGNOSIS — F329 Major depressive disorder, single episode, unspecified: Secondary | ICD-10-CM | POA: Diagnosis not present

## 2020-05-01 DIAGNOSIS — J45909 Unspecified asthma, uncomplicated: Secondary | ICD-10-CM | POA: Diagnosis not present

## 2020-05-22 DIAGNOSIS — R030 Elevated blood-pressure reading, without diagnosis of hypertension: Secondary | ICD-10-CM | POA: Diagnosis not present

## 2020-05-22 DIAGNOSIS — M4804 Spinal stenosis, thoracic region: Secondary | ICD-10-CM | POA: Diagnosis not present

## 2020-05-22 DIAGNOSIS — Z6829 Body mass index (BMI) 29.0-29.9, adult: Secondary | ICD-10-CM | POA: Diagnosis not present

## 2020-05-22 DIAGNOSIS — R29898 Other symptoms and signs involving the musculoskeletal system: Secondary | ICD-10-CM | POA: Diagnosis not present

## 2020-05-22 DIAGNOSIS — M5412 Radiculopathy, cervical region: Secondary | ICD-10-CM | POA: Diagnosis not present

## 2020-05-22 DIAGNOSIS — M48062 Spinal stenosis, lumbar region with neurogenic claudication: Secondary | ICD-10-CM | POA: Diagnosis not present

## 2020-05-22 DIAGNOSIS — M4310 Spondylolisthesis, site unspecified: Secondary | ICD-10-CM | POA: Diagnosis not present

## 2020-06-06 DIAGNOSIS — M5000 Cervical disc disorder with myelopathy, unspecified cervical region: Secondary | ICD-10-CM | POA: Diagnosis not present

## 2020-06-06 DIAGNOSIS — Z1152 Encounter for screening for COVID-19: Secondary | ICD-10-CM | POA: Diagnosis not present

## 2020-06-07 DIAGNOSIS — M542 Cervicalgia: Secondary | ICD-10-CM | POA: Diagnosis not present

## 2020-06-07 DIAGNOSIS — M4802 Spinal stenosis, cervical region: Secondary | ICD-10-CM | POA: Diagnosis not present

## 2020-06-07 DIAGNOSIS — M4712 Other spondylosis with myelopathy, cervical region: Secondary | ICD-10-CM | POA: Diagnosis not present

## 2020-06-07 DIAGNOSIS — Z981 Arthrodesis status: Secondary | ICD-10-CM | POA: Diagnosis not present

## 2020-06-07 DIAGNOSIS — M5001 Cervical disc disorder with myelopathy,  high cervical region: Secondary | ICD-10-CM | POA: Diagnosis not present

## 2020-06-13 DIAGNOSIS — F329 Major depressive disorder, single episode, unspecified: Secondary | ICD-10-CM | POA: Diagnosis not present

## 2020-06-13 DIAGNOSIS — J45909 Unspecified asthma, uncomplicated: Secondary | ICD-10-CM | POA: Diagnosis not present

## 2020-06-13 DIAGNOSIS — G4700 Insomnia, unspecified: Secondary | ICD-10-CM | POA: Diagnosis not present

## 2020-06-13 DIAGNOSIS — J449 Chronic obstructive pulmonary disease, unspecified: Secondary | ICD-10-CM | POA: Diagnosis not present

## 2020-06-13 DIAGNOSIS — G8929 Other chronic pain: Secondary | ICD-10-CM | POA: Diagnosis not present

## 2020-06-27 DIAGNOSIS — L821 Other seborrheic keratosis: Secondary | ICD-10-CM | POA: Diagnosis not present

## 2020-06-27 DIAGNOSIS — D692 Other nonthrombocytopenic purpura: Secondary | ICD-10-CM | POA: Diagnosis not present

## 2020-06-28 DIAGNOSIS — M5000 Cervical disc disorder with myelopathy, unspecified cervical region: Secondary | ICD-10-CM | POA: Diagnosis not present

## 2020-06-28 DIAGNOSIS — M4804 Spinal stenosis, thoracic region: Secondary | ICD-10-CM | POA: Diagnosis not present

## 2020-06-28 DIAGNOSIS — M5412 Radiculopathy, cervical region: Secondary | ICD-10-CM | POA: Diagnosis not present

## 2020-06-29 ENCOUNTER — Other Ambulatory Visit: Payer: Self-pay | Admitting: Neurosurgery

## 2020-06-29 DIAGNOSIS — M4804 Spinal stenosis, thoracic region: Secondary | ICD-10-CM

## 2020-07-03 ENCOUNTER — Other Ambulatory Visit: Payer: Self-pay | Admitting: Family Medicine

## 2020-07-03 ENCOUNTER — Encounter (HOSPITAL_COMMUNITY): Payer: Self-pay

## 2020-07-03 ENCOUNTER — Ambulatory Visit (HOSPITAL_COMMUNITY): Payer: Medicare HMO

## 2020-07-03 ENCOUNTER — Other Ambulatory Visit (HOSPITAL_COMMUNITY): Payer: Self-pay | Admitting: Family Medicine

## 2020-07-03 DIAGNOSIS — R58 Hemorrhage, not elsewhere classified: Secondary | ICD-10-CM | POA: Diagnosis not present

## 2020-07-03 DIAGNOSIS — R519 Headache, unspecified: Secondary | ICD-10-CM | POA: Diagnosis not present

## 2020-07-03 DIAGNOSIS — R42 Dizziness and giddiness: Secondary | ICD-10-CM | POA: Diagnosis not present

## 2020-07-03 DIAGNOSIS — S0083XA Contusion of other part of head, initial encounter: Secondary | ICD-10-CM

## 2020-07-03 DIAGNOSIS — R937 Abnormal findings on diagnostic imaging of other parts of musculoskeletal system: Secondary | ICD-10-CM | POA: Diagnosis not present

## 2020-07-03 DIAGNOSIS — S300XXA Contusion of lower back and pelvis, initial encounter: Secondary | ICD-10-CM | POA: Diagnosis not present

## 2020-07-03 DIAGNOSIS — R110 Nausea: Secondary | ICD-10-CM | POA: Diagnosis not present

## 2020-07-04 ENCOUNTER — Other Ambulatory Visit: Payer: Self-pay | Admitting: Family Medicine

## 2020-07-04 ENCOUNTER — Ambulatory Visit
Admission: RE | Admit: 2020-07-04 | Discharge: 2020-07-04 | Disposition: A | Discharge status: Home / Self Care | Payer: Medicare HMO | Source: Ambulatory Visit | Attending: Family Medicine | Admitting: Family Medicine

## 2020-07-04 ENCOUNTER — Other Ambulatory Visit: Payer: Self-pay

## 2020-07-04 DIAGNOSIS — R42 Dizziness and giddiness: Secondary | ICD-10-CM | POA: Diagnosis not present

## 2020-07-04 DIAGNOSIS — S0990XA Unspecified injury of head, initial encounter: Secondary | ICD-10-CM | POA: Diagnosis not present

## 2020-07-04 DIAGNOSIS — R102 Pelvic and perineal pain: Secondary | ICD-10-CM | POA: Diagnosis not present

## 2020-07-04 DIAGNOSIS — S0083XA Contusion of other part of head, initial encounter: Secondary | ICD-10-CM

## 2020-07-06 ENCOUNTER — Ambulatory Visit
Admission: RE | Admit: 2020-07-06 | Discharge: 2020-07-06 | Disposition: A | Discharge status: Home / Self Care | Payer: Medicare HMO | Source: Ambulatory Visit | Attending: Neurosurgery | Admitting: Neurosurgery

## 2020-07-06 ENCOUNTER — Other Ambulatory Visit: Payer: Self-pay

## 2020-07-06 DIAGNOSIS — M40204 Unspecified kyphosis, thoracic region: Secondary | ICD-10-CM | POA: Diagnosis not present

## 2020-07-06 DIAGNOSIS — M4804 Spinal stenosis, thoracic region: Secondary | ICD-10-CM

## 2020-07-06 DIAGNOSIS — M4319 Spondylolisthesis, multiple sites in spine: Secondary | ICD-10-CM | POA: Diagnosis not present

## 2020-07-06 DIAGNOSIS — M5124 Other intervertebral disc displacement, thoracic region: Secondary | ICD-10-CM | POA: Diagnosis not present

## 2020-07-06 DIAGNOSIS — M47814 Spondylosis without myelopathy or radiculopathy, thoracic region: Secondary | ICD-10-CM | POA: Diagnosis not present

## 2020-07-07 DIAGNOSIS — S300XXD Contusion of lower back and pelvis, subsequent encounter: Secondary | ICD-10-CM | POA: Diagnosis not present

## 2020-07-11 DIAGNOSIS — J45909 Unspecified asthma, uncomplicated: Secondary | ICD-10-CM | POA: Diagnosis not present

## 2020-07-11 DIAGNOSIS — F329 Major depressive disorder, single episode, unspecified: Secondary | ICD-10-CM | POA: Diagnosis not present

## 2020-07-11 DIAGNOSIS — G4700 Insomnia, unspecified: Secondary | ICD-10-CM | POA: Diagnosis not present

## 2020-07-11 DIAGNOSIS — J449 Chronic obstructive pulmonary disease, unspecified: Secondary | ICD-10-CM | POA: Diagnosis not present

## 2020-07-11 DIAGNOSIS — G8929 Other chronic pain: Secondary | ICD-10-CM | POA: Diagnosis not present

## 2020-07-12 DIAGNOSIS — G992 Myelopathy in diseases classified elsewhere: Secondary | ICD-10-CM | POA: Diagnosis not present

## 2020-07-12 DIAGNOSIS — Z6828 Body mass index (BMI) 28.0-28.9, adult: Secondary | ICD-10-CM | POA: Diagnosis not present

## 2020-07-12 DIAGNOSIS — M4804 Spinal stenosis, thoracic region: Secondary | ICD-10-CM | POA: Diagnosis not present

## 2020-07-12 DIAGNOSIS — R030 Elevated blood-pressure reading, without diagnosis of hypertension: Secondary | ICD-10-CM | POA: Diagnosis not present

## 2020-07-17 ENCOUNTER — Other Ambulatory Visit: Payer: Self-pay | Admitting: Neurosurgery

## 2020-07-21 NOTE — H&P (Signed)
Patient ID:   [id number] Patient: Gabriel Richardson  Date of Birth: [birth year] Visit Type: Office Visit   Date: 07/12/2020 04:45 PM Provider: Marchia Meiers. Vertell Limber MD   This 75 year old male presents for MRI Review.  HISTORY OF PRESENT ILLNESS: 1.  MRI Review  The patient's thoracic MRI was reviewed with him in the office.  He does continue to have significant spinal cord compression at the T2-T3 level.  He has some mild degenerative changes throughout the remainder of his thoracic spine but none of which are causing significant spinal cord compression.  The patient continues to have problems with his gait and with his balance and stiffness in his legs.  He is doing well from the standpoint of his recent neck surgery.  I have recommended proceeding with posterior thoracic spinal cord decompression via laminectomy at the T2/T3 levels.  Risks and benefits were discussed in detail with the patient and he wishes to proceed with surgery.     Medical/Surgical/Interim History Reviewed, no change.  Last detailed document date:10/11/2014.    PAST MEDICAL HISTORY, SURGICAL HISTORY, FAMILY HISTORY, SOCIAL HISTORY AND REVIEW OF SYSTEMS I have reviewed the patient's past medical, surgical, family and social history as well as the comprehensive review of systems as included on the [facility] history form dated 05/22/2020, which I have signed.  PAST MEDICAL HISTORIES:  Family History: Reviewed, no changes.  Last detailed document date:10/11/2014.   Social History: Reviewed, no changes. Last detailed document date: 10/11/2014.    Started Medication Directions Instruction Stopped  Ambien 10 mg tablet take 1 tablet by oral route  every day at bedtime    Lamictal 200 mg tablet take 1 tablet by oral route  every evening   06/07/2020 methocarbamol 500 mg tablet take 1 tablet by oral route 4 times every day   06/07/2020 Percocet 5 mg-325 mg tablet take  1-2 tablets by oral route  every 4-6 hours as needed for pain    propanolol  BUCCAL take 1 atblet ( 60mg  ) by mouth daily   10/25/2019 tramadol 50 mg tablet take 1 tablet by oral route  every 6 hours as needed for pain       ALLERGIES: Ingredient Reaction Medication Name Comment NO KNOWN ALLERGIES    No known allergies. Reviewed, no changes.  PHYSICAL EXAM:  Vitals Date Temp F BP Pulse Ht In Wt Lb BMI BSA Pain Score 07/12/2020  150/80 62 70 201 28.84  7/10     IMPRESSION  The patient has persistent thoracic spinal cord compression at the T2-T3 levels.  I have recommended a thoracic laminectomy from T2 through T3 levels.  He wishes for this to be done as soon as possible.  Risks and benefits were discussed in detail with the patient.  He wishes to proceed with surgery.   PLAN: Thoracic 2 and thoracic 3 decompressive laminectomy for spinal stenosis and cord compression with thoracic myelopathy  Orders: Instruction(s)/Education: Assessment Instruction R03.0 Lifestyle education Z68.28 Dietary management education, guidance, and counseling  Completed Orders (this encounter) Order Details Reason Side Interpretation Result Initial Treatment Date Region Lifestyle education Patient will monitor and contact primary care physician if needed.       Dietary management education, guidance, and counseling Encouraged patient to eat well balanced diet.        Assessment/Plan  # Detail Type Description  1. 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     2. Assessment Thoracic spinal stenosis (M48.04).  3. Assessment Elevated blood-pressure reading, w/o diagnosis of htn (R03.0).     4. Assessment Body mass index (BMI) 28.0-28.9, adult (Y19.50).  Plan Orders Today's instructions / counseling include(s) Dietary management education, guidance, and counseling. Clinical information/comments:  Encouraged patient to eat well balanced diet.       Pain Management Plan Pain Scale: 7/10. Method: Numeric Pain Intensity Scale. Location: back, neck. Onset: 07/13/2014. Duration: varies. Quality: discomforting. Pain management follow-up plan of care: Patient will continue medication management..          Provider:  Marchia Meiers. Vertell Limber MD  07/14/2020 01:09 PM  Dictation edited by: Marchia Meiers. Vertell Limber    CC Providers:  Yaakov Guthrie [address] [place],  [place]  [id number]-  Francis Wong  [address] [place], [place] [address]-    Electronically signed by Marchia Meiers Vertell Limber MD on 07/14/2020 01:13 PM

## 2020-07-22 ENCOUNTER — Other Ambulatory Visit: Payer: Medicare HMO

## 2020-07-27 DIAGNOSIS — S300XXD Contusion of lower back and pelvis, subsequent encounter: Secondary | ICD-10-CM | POA: Diagnosis not present

## 2020-07-27 DIAGNOSIS — R29898 Other symptoms and signs involving the musculoskeletal system: Secondary | ICD-10-CM | POA: Diagnosis not present

## 2020-07-27 DIAGNOSIS — M4807 Spinal stenosis, lumbosacral region: Secondary | ICD-10-CM | POA: Diagnosis not present

## 2020-08-04 ENCOUNTER — Other Ambulatory Visit (HOSPITAL_COMMUNITY)
Admission: RE | Admit: 2020-08-04 | Discharge: 2020-08-04 | Disposition: A | Discharge status: Home / Self Care | Payer: Medicare HMO | Source: Ambulatory Visit | Attending: Neurosurgery | Admitting: Neurosurgery

## 2020-08-04 DIAGNOSIS — Z01812 Encounter for preprocedural laboratory examination: Secondary | ICD-10-CM | POA: Insufficient documentation

## 2020-08-04 DIAGNOSIS — Z20822 Contact with and (suspected) exposure to covid-19: Secondary | ICD-10-CM | POA: Diagnosis not present

## 2020-08-04 LAB — SARS CORONAVIRUS 2 (TAT 6-24 HRS): SARS Coronavirus 2: NEGATIVE

## 2020-08-07 NOTE — Progress Notes (Signed)
Gabriel Richardson denies chest pain or shortness of breath. Patient was tested for Covid and has been in quarantine since that time.

## 2020-08-08 ENCOUNTER — Other Ambulatory Visit: Payer: Self-pay

## 2020-08-08 ENCOUNTER — Ambulatory Visit (HOSPITAL_COMMUNITY): Payer: Medicare HMO

## 2020-08-08 ENCOUNTER — Encounter (HOSPITAL_COMMUNITY): Payer: Self-pay | Admitting: Neurosurgery

## 2020-08-08 ENCOUNTER — Observation Stay (HOSPITAL_COMMUNITY)
Admission: RE | Admit: 2020-08-08 | Discharge: 2020-08-09 | Disposition: A | Discharge status: Home / Self Care | Payer: Medicare HMO | Attending: Neurosurgery | Admitting: Neurosurgery

## 2020-08-08 ENCOUNTER — Ambulatory Visit (HOSPITAL_COMMUNITY)
Admission: RE | Disposition: A | Discharge status: Home / Self Care | Payer: Self-pay | Source: Home / Self Care | Attending: Neurosurgery

## 2020-08-08 DIAGNOSIS — G992 Myelopathy in diseases classified elsewhere: Secondary | ICD-10-CM | POA: Diagnosis not present

## 2020-08-08 DIAGNOSIS — M961 Postlaminectomy syndrome, not elsewhere classified: Secondary | ICD-10-CM | POA: Diagnosis not present

## 2020-08-08 DIAGNOSIS — G9520 Unspecified cord compression: Secondary | ICD-10-CM | POA: Diagnosis not present

## 2020-08-08 DIAGNOSIS — Z79899 Other long term (current) drug therapy: Secondary | ICD-10-CM | POA: Diagnosis not present

## 2020-08-08 DIAGNOSIS — Z419 Encounter for procedure for purposes other than remedying health state, unspecified: Secondary | ICD-10-CM

## 2020-08-08 DIAGNOSIS — M4714 Other spondylosis with myelopathy, thoracic region: Secondary | ICD-10-CM | POA: Diagnosis not present

## 2020-08-08 DIAGNOSIS — M7138 Other bursal cyst, other site: Secondary | ICD-10-CM | POA: Insufficient documentation

## 2020-08-08 DIAGNOSIS — R030 Elevated blood-pressure reading, without diagnosis of hypertension: Secondary | ICD-10-CM | POA: Diagnosis not present

## 2020-08-08 DIAGNOSIS — F418 Other specified anxiety disorders: Secondary | ICD-10-CM | POA: Diagnosis not present

## 2020-08-08 DIAGNOSIS — G959 Disease of spinal cord, unspecified: Secondary | ICD-10-CM | POA: Diagnosis not present

## 2020-08-08 DIAGNOSIS — M4804 Spinal stenosis, thoracic region: Secondary | ICD-10-CM | POA: Insufficient documentation

## 2020-08-08 HISTORY — PX: THORACIC DISCECTOMY: SHX6113

## 2020-08-08 LAB — CBC
HCT: 41.3 % (ref 39.0–52.0)
Hemoglobin: 13.9 g/dL (ref 13.0–17.0)
MCH: 34.8 pg — ABNORMAL HIGH (ref 26.0–34.0)
MCHC: 33.7 g/dL (ref 30.0–36.0)
MCV: 103.5 fL — ABNORMAL HIGH (ref 80.0–100.0)
Platelets: 193 10*3/uL (ref 150–400)
RBC: 3.99 MIL/uL — ABNORMAL LOW (ref 4.22–5.81)
RDW: 13.4 % (ref 11.5–15.5)
WBC: 5.5 10*3/uL (ref 4.0–10.5)
nRBC: 0 % (ref 0.0–0.2)

## 2020-08-08 LAB — BASIC METABOLIC PANEL
Anion gap: 8 (ref 5–15)
BUN: 9 mg/dL (ref 8–23)
CO2: 25 mmol/L (ref 22–32)
Calcium: 8.8 mg/dL — ABNORMAL LOW (ref 8.9–10.3)
Chloride: 103 mmol/L (ref 98–111)
Creatinine, Ser: 0.61 mg/dL (ref 0.61–1.24)
GFR, Estimated: >60 mL/min (ref >60)
Glucose, Bld: 106 mg/dL — ABNORMAL HIGH (ref 70–99)
Potassium: 4.2 mmol/L (ref 3.5–5.1)
Sodium: 136 mmol/L (ref 135–145)

## 2020-08-08 LAB — SURGICAL PCR SCREEN
MRSA, PCR: NEGATIVE
Staphylococcus aureus: NEGATIVE

## 2020-08-08 SURGERY — THORACIC DISCECTOMY
Anesthesia: General | Site: Spine Thoracic

## 2020-08-08 MED ORDER — METHOCARBAMOL 500 MG PO TABS
ORAL_TABLET | ORAL | Status: AC
  Filled 2020-08-08: qty 1

## 2020-08-08 MED ORDER — POLYETHYLENE GLYCOL 3350 17 G PO PACK: 17.0000 g | PACK | Freq: Every day | ORAL | Status: DC | PRN

## 2020-08-08 MED ORDER — HYDROCODONE-ACETAMINOPHEN 5-325 MG PO TABS
1.0000 | ORAL_TABLET | ORAL | Status: DC | PRN
Start: 2020-08-08 — End: 2020-08-09

## 2020-08-08 MED ORDER — CEFAZOLIN SODIUM-DEXTROSE 2-4 GM/100ML-% IV SOLN
2.0000 g | INTRAVENOUS | Status: AC
  Administered 2020-08-08: 2 g via INTRAVENOUS
  Filled 2020-08-08: qty 100

## 2020-08-08 MED ORDER — FENTANYL CITRATE (PF) 100 MCG/2ML IJ SOLN
INTRAMUSCULAR | Status: DC | PRN
  Administered 2020-08-08 (×2): 100 ug via INTRAVENOUS

## 2020-08-08 MED ORDER — PANTOPRAZOLE SODIUM 40 MG IV SOLR: 40.0000 mg | Freq: Every day | INTRAVENOUS | Status: DC

## 2020-08-08 MED ORDER — 0.9 % SODIUM CHLORIDE (POUR BTL) OPTIME
TOPICAL | Status: DC | PRN
  Administered 2020-08-08: 1000 mL

## 2020-08-08 MED ORDER — PRIMIDONE 50 MG PO TABS
100.0000 mg | ORAL_TABLET | Freq: Two times a day (BID) | ORAL | Status: DC
  Administered 2020-08-08 (×2): 100 mg via ORAL
  Filled 2020-08-08 (×3): qty 2

## 2020-08-08 MED ORDER — CHLORHEXIDINE GLUCONATE 0.12 % MT SOLN
15.0000 mL | Freq: Once | OROMUCOSAL | Status: AC
  Administered 2020-08-08: 15 mL via OROMUCOSAL
  Filled 2020-08-08: qty 15

## 2020-08-08 MED ORDER — ACETAMINOPHEN 650 MG RE SUPP: 650.0000 mg | RECTAL | Status: DC | PRN

## 2020-08-08 MED ORDER — FENTANYL CITRATE (PF) 100 MCG/2ML IJ SOLN
25.0000 ug | INTRAMUSCULAR | Status: DC | PRN
  Administered 2020-08-08 (×2): 50 ug via INTRAVENOUS

## 2020-08-08 MED ORDER — OXYCODONE HCL 5 MG PO TABS
5.0000 mg | ORAL_TABLET | Freq: Four times a day (QID) | ORAL | Status: DC | PRN
  Administered 2020-08-08: 5 mg via ORAL

## 2020-08-08 MED ORDER — KETOROLAC TROMETHAMINE 15 MG/ML IJ SOLN
7.5000 mg | Freq: Four times a day (QID) | INTRAMUSCULAR | Status: DC
  Administered 2020-08-08 – 2020-08-09 (×3): 7.5 mg via INTRAVENOUS
  Filled 2020-08-08 (×3): qty 1

## 2020-08-08 MED ORDER — MENTHOL 3 MG MT LOZG
1.0000 | LOZENGE | OROMUCOSAL | Status: DC | PRN
Start: 2020-08-08 — End: 2020-08-09

## 2020-08-08 MED ORDER — ZOLPIDEM TARTRATE 5 MG PO TABS
10.0000 mg | ORAL_TABLET | Freq: Every day | ORAL | Status: DC
  Administered 2020-08-08: 10 mg via ORAL
  Filled 2020-08-08: qty 2

## 2020-08-08 MED ORDER — METHOCARBAMOL 1000 MG/10ML IJ SOLN
500.0000 mg | Freq: Four times a day (QID) | INTRAVENOUS | Status: DC | PRN
  Filled 2020-08-08: qty 5

## 2020-08-08 MED ORDER — BUPIVACAINE HCL (PF) 0.5 % IJ SOLN
INTRAMUSCULAR | Status: AC
  Filled 2020-08-08: qty 30

## 2020-08-08 MED ORDER — PROPOFOL 10 MG/ML IV BOLUS
INTRAVENOUS | Status: AC
  Filled 2020-08-08: qty 40

## 2020-08-08 MED ORDER — LACTATED RINGERS IV SOLN: INTRAVENOUS | Status: DC

## 2020-08-08 MED ORDER — ACETAMINOPHEN 500 MG PO TABS: 1000.0000 mg | ORAL_TABLET | Freq: Four times a day (QID) | ORAL | Status: DC | PRN

## 2020-08-08 MED ORDER — PROPOFOL 10 MG/ML IV BOLUS
INTRAVENOUS | Status: DC | PRN
  Administered 2020-08-08: 100 mg via INTRAVENOUS
  Administered 2020-08-08: 30 mg via INTRAVENOUS

## 2020-08-08 MED ORDER — PHENOL 1.4 % MT LIQD: 1.0000 | OROMUCOSAL | Status: DC | PRN

## 2020-08-08 MED ORDER — LAMOTRIGINE 100 MG PO TABS
200.0000 mg | ORAL_TABLET | Freq: Every day | ORAL | Status: DC
  Administered 2020-08-08: 200 mg via ORAL
  Filled 2020-08-08: qty 2

## 2020-08-08 MED ORDER — LIDOCAINE-EPINEPHRINE 1 %-1:100000 IJ SOLN
INTRAMUSCULAR | Status: AC
  Filled 2020-08-08: qty 1

## 2020-08-08 MED ORDER — ROCURONIUM BROMIDE 10 MG/ML (PF) SYRINGE
PREFILLED_SYRINGE | INTRAVENOUS | Status: DC | PRN
  Administered 2020-08-08: 20 mg via INTRAVENOUS
  Administered 2020-08-08: 60 mg via INTRAVENOUS

## 2020-08-08 MED ORDER — OXYCODONE HCL 5 MG PO TABS
ORAL_TABLET | ORAL | Status: AC
  Filled 2020-08-08: qty 1

## 2020-08-08 MED ORDER — SUGAMMADEX SODIUM 200 MG/2ML IV SOLN
INTRAVENOUS | Status: DC | PRN
  Administered 2020-08-08: 200 mg via INTRAVENOUS

## 2020-08-08 MED ORDER — METHOCARBAMOL 500 MG PO TABS
500.0000 mg | ORAL_TABLET | Freq: Four times a day (QID) | ORAL | Status: DC | PRN
  Administered 2020-08-08 – 2020-08-09 (×4): 500 mg via ORAL
  Filled 2020-08-08 (×3): qty 1

## 2020-08-08 MED ORDER — FLEET ENEMA 7-19 GM/118ML RE ENEM: 1.0000 | ENEMA | Freq: Once | RECTAL | Status: DC | PRN

## 2020-08-08 MED ORDER — LIDOCAINE 2% (20 MG/ML) 5 ML SYRINGE
INTRAMUSCULAR | Status: DC | PRN
  Administered 2020-08-08: 80 mg via INTRAVENOUS

## 2020-08-08 MED ORDER — ONDANSETRON HCL 4 MG/2ML IJ SOLN: 4.0000 mg | Freq: Once | INTRAMUSCULAR | Status: DC | PRN

## 2020-08-08 MED ORDER — SODIUM CHLORIDE 0.9 % IV SOLN
250.0000 mL | INTRAVENOUS | Status: DC
  Administered 2020-08-08: 250 mL via INTRAVENOUS

## 2020-08-08 MED ORDER — LACTATED RINGERS IV SOLN: INTRAVENOUS | Status: DC | PRN

## 2020-08-08 MED ORDER — FENTANYL CITRATE (PF) 250 MCG/5ML IJ SOLN
INTRAMUSCULAR | Status: AC
  Filled 2020-08-08: qty 5

## 2020-08-08 MED ORDER — PROPRANOLOL HCL ER 120 MG PO CP24
120.0000 mg | ORAL_CAPSULE | Freq: Every day | ORAL | Status: DC | PRN
  Filled 2020-08-08: qty 1

## 2020-08-08 MED ORDER — ORAL CARE MOUTH RINSE: 15.0000 mL | Freq: Once | OROMUCOSAL | Status: AC

## 2020-08-08 MED ORDER — PHENYLEPHRINE HCL-NACL 10-0.9 MG/250ML-% IV SOLN
INTRAVENOUS | Status: DC | PRN
  Administered 2020-08-08: 75 ug/min via INTRAVENOUS

## 2020-08-08 MED ORDER — DEXAMETHASONE SODIUM PHOSPHATE 10 MG/ML IJ SOLN
INTRAMUSCULAR | Status: DC | PRN
  Administered 2020-08-08: 10 mg via INTRAVENOUS

## 2020-08-08 MED ORDER — PHENYLEPHRINE 40 MCG/ML (10ML) SYRINGE FOR IV PUSH (FOR BLOOD PRESSURE SUPPORT)
PREFILLED_SYRINGE | INTRAVENOUS | Status: DC | PRN
  Administered 2020-08-08: 80 ug via INTRAVENOUS
  Administered 2020-08-08: 100 ug via INTRAVENOUS

## 2020-08-08 MED ORDER — MIDAZOLAM HCL 2 MG/2ML IJ SOLN
INTRAMUSCULAR | Status: AC
  Filled 2020-08-08: qty 2

## 2020-08-08 MED ORDER — THROMBIN 5000 UNITS EX KIT
PACK | CUTANEOUS | Status: AC
  Filled 2020-08-08: qty 1

## 2020-08-08 MED ORDER — PROPRANOLOL HCL ER 60 MG PO CP24
120.0000 mg | ORAL_CAPSULE | Freq: Two times a day (BID) | ORAL | Status: DC
  Administered 2020-08-08 (×2): 120 mg via ORAL
  Filled 2020-08-08 (×3): qty 2

## 2020-08-08 MED ORDER — THROMBIN 5000 UNITS EX SOLR
CUTANEOUS | Status: DC | PRN
  Administered 2020-08-08: 5000 [IU] via TOPICAL

## 2020-08-08 MED ORDER — FENTANYL CITRATE (PF) 100 MCG/2ML IJ SOLN
INTRAMUSCULAR | Status: AC
  Filled 2020-08-08: qty 2

## 2020-08-08 MED ORDER — CEFAZOLIN SODIUM-DEXTROSE 2-4 GM/100ML-% IV SOLN
2.0000 g | Freq: Three times a day (TID) | INTRAVENOUS | Status: AC
  Administered 2020-08-08 – 2020-08-09 (×2): 2 g via INTRAVENOUS
  Filled 2020-08-08 (×2): qty 100

## 2020-08-08 MED ORDER — LIDOCAINE-EPINEPHRINE 1 %-1:100000 IJ SOLN
INTRAMUSCULAR | Status: DC | PRN
  Administered 2020-08-08: 5 mL

## 2020-08-08 MED ORDER — BUPIVACAINE HCL (PF) 0.5 % IJ SOLN
INTRAMUSCULAR | Status: DC | PRN
  Administered 2020-08-08: 5 mL

## 2020-08-08 MED ORDER — BACITRACIN ZINC 500 UNIT/GM EX OINT
TOPICAL_OINTMENT | CUTANEOUS | Status: AC
  Filled 2020-08-08: qty 28.35

## 2020-08-08 MED ORDER — SODIUM CHLORIDE 0.9% FLUSH: 3.0000 mL | INTRAVENOUS | Status: DC | PRN

## 2020-08-08 MED ORDER — BISACODYL 10 MG RE SUPP: 10.0000 mg | Freq: Every day | RECTAL | Status: DC | PRN

## 2020-08-08 MED ORDER — ONDANSETRON HCL 4 MG PO TABS: 4.0000 mg | ORAL_TABLET | Freq: Four times a day (QID) | ORAL | Status: DC | PRN

## 2020-08-08 MED ORDER — CHLORHEXIDINE GLUCONATE CLOTH 2 % EX PADS: 6.0000 | MEDICATED_PAD | Freq: Once | CUTANEOUS | Status: DC

## 2020-08-08 MED ORDER — ACETAMINOPHEN 325 MG PO TABS: 650.0000 mg | ORAL_TABLET | ORAL | Status: DC | PRN

## 2020-08-08 MED ORDER — KCL IN DEXTROSE-NACL 20-5-0.45 MEQ/L-%-% IV SOLN: INTRAVENOUS | Status: DC

## 2020-08-08 MED ORDER — DOCUSATE SODIUM 100 MG PO CAPS
100.0000 mg | ORAL_CAPSULE | Freq: Two times a day (BID) | ORAL | Status: DC
  Administered 2020-08-08 (×2): 100 mg via ORAL
  Filled 2020-08-08 (×2): qty 1

## 2020-08-08 MED ORDER — ALUM & MAG HYDROXIDE-SIMETH 200-200-20 MG/5ML PO SUSP: 30.0000 mL | Freq: Four times a day (QID) | ORAL | Status: DC | PRN

## 2020-08-08 MED ORDER — ONDANSETRON HCL 4 MG/2ML IJ SOLN
INTRAMUSCULAR | Status: DC | PRN
  Administered 2020-08-08: 4 mg via INTRAVENOUS

## 2020-08-08 MED ORDER — ONDANSETRON HCL 4 MG/2ML IJ SOLN: 4.0000 mg | Freq: Four times a day (QID) | INTRAMUSCULAR | Status: DC | PRN

## 2020-08-08 MED ORDER — OXYCODONE HCL 5 MG PO TABS
10.0000 mg | ORAL_TABLET | ORAL | Status: DC | PRN
Start: 2020-08-08 — End: 2020-08-09
  Administered 2020-08-08 – 2020-08-09 (×4): 10 mg via ORAL
  Filled 2020-08-08 (×4): qty 2

## 2020-08-08 MED ORDER — HYDROMORPHONE HCL 1 MG/ML IJ SOLN
0.5000 mg | INTRAMUSCULAR | Status: DC | PRN
  Administered 2020-08-08: 0.5 mg via INTRAVENOUS
  Filled 2020-08-08: qty 0.5

## 2020-08-08 MED ORDER — ZOLPIDEM TARTRATE 5 MG PO TABS: 5.0000 mg | ORAL_TABLET | Freq: Every evening | ORAL | Status: DC | PRN

## 2020-08-08 MED ORDER — SODIUM CHLORIDE 0.9% FLUSH
3.0000 mL | Freq: Two times a day (BID) | INTRAVENOUS | Status: DC
  Administered 2020-08-08 (×2): 3 mL via INTRAVENOUS

## 2020-08-08 SURGICAL SUPPLY — 56 items
ADH SKN CLS APL DERMABOND .7 (GAUZE/BANDAGES/DRESSINGS) ×1
BIT DRILL NEURO 2X3.1 SFT TUCH (MISCELLANEOUS) ×1 IMPLANT
BLADE CLIPPER SURG (BLADE) IMPLANT
BUR ROUND FLUTED 5 RND (BURR) ×2 IMPLANT
BUR ROUND FLUTED 5MM RND (BURR) ×1
CANISTER SUCT 3000ML PPV (MISCELLANEOUS) ×3 IMPLANT
CARTRIDGE OIL MAESTRO DRILL (MISCELLANEOUS) ×1 IMPLANT
COVER WAND RF STERILE (DRAPES) ×1 IMPLANT
DECANTER SPIKE VIAL GLASS SM (MISCELLANEOUS) ×3 IMPLANT
DERMABOND ADVANCED (GAUZE/BANDAGES/DRESSINGS) ×2
DERMABOND ADVANCED .7 DNX12 (GAUZE/BANDAGES/DRESSINGS) ×1 IMPLANT
DIFFUSER DRILL AIR PNEUMATIC (MISCELLANEOUS) ×3 IMPLANT
DRAPE C-ARM 42X72 X-RAY (DRAPES) ×4 IMPLANT
DRAPE LAPAROTOMY 100X72X124 (DRAPES) ×3 IMPLANT
DRAPE MICROSCOPE LEICA (MISCELLANEOUS) ×3 IMPLANT
DRILL NEURO 2X3.1 SOFT TOUCH (MISCELLANEOUS) ×3
DRSG OPSITE POSTOP 3X4 (GAUZE/BANDAGES/DRESSINGS) ×2 IMPLANT
DURAPREP 26ML APPLICATOR (WOUND CARE) ×3 IMPLANT
ELECT BLADE 4.0 EZ CLEAN MEGAD (MISCELLANEOUS) ×3
ELECT REM PT RETURN 9FT ADLT (ELECTROSURGICAL) ×3
ELECTRODE BLDE 4.0 EZ CLN MEGD (MISCELLANEOUS) IMPLANT
ELECTRODE REM PT RTRN 9FT ADLT (ELECTROSURGICAL) ×1 IMPLANT
EVACUATOR 1/8 PVC DRAIN (DRAIN) ×2 IMPLANT
GAUZE 4X4 16PLY RFD (DISPOSABLE) IMPLANT
GAUZE SPONGE 4X4 12PLY STRL (GAUZE/BANDAGES/DRESSINGS) IMPLANT
GLOVE BIO SURGEON STRL SZ8 (GLOVE) ×3 IMPLANT
GLOVE BIOGEL PI IND STRL 8.5 (GLOVE) ×1 IMPLANT
GLOVE BIOGEL PI INDICATOR 8.5 (GLOVE) ×2
GLOVE ECLIPSE 8.0 STRL XLNG CF (GLOVE) ×5 IMPLANT
GLOVE EXAM NITRILE XL STR (GLOVE) IMPLANT
GLOVE SRG 8 PF TXTR STRL LF DI (GLOVE) ×1 IMPLANT
GLOVE SURG POLYISO LF SZ8 (GLOVE) ×4 IMPLANT
GLOVE SURG UNDER POLY LF SZ8 (GLOVE) ×6
GLOVE SURG UNDER POLY LF SZ8.5 (GLOVE) ×4 IMPLANT
GOWN STRL REUS W/ TWL LRG LVL3 (GOWN DISPOSABLE) IMPLANT
GOWN STRL REUS W/ TWL XL LVL3 (GOWN DISPOSABLE) IMPLANT
GOWN STRL REUS W/TWL 2XL LVL3 (GOWN DISPOSABLE) IMPLANT
GOWN STRL REUS W/TWL LRG LVL3 (GOWN DISPOSABLE)
GOWN STRL REUS W/TWL XL LVL3 (GOWN DISPOSABLE)
KIT BASIN OR (CUSTOM PROCEDURE TRAY) ×3 IMPLANT
KIT TURNOVER KIT B (KITS) ×3 IMPLANT
NDL HYPO 25X1 1.5 SAFETY (NEEDLE) ×1 IMPLANT
NEEDLE HYPO 25X1 1.5 SAFETY (NEEDLE) ×3 IMPLANT
NS IRRIG 1000ML POUR BTL (IV SOLUTION) ×3 IMPLANT
OIL CARTRIDGE MAESTRO DRILL (MISCELLANEOUS) ×3
PACK LAMINECTOMY NEURO (CUSTOM PROCEDURE TRAY) ×3 IMPLANT
PAD ARMBOARD 7.5X6 YLW CONV (MISCELLANEOUS) ×15 IMPLANT
SPONGE SURGIFOAM ABS GEL SZ50 (HEMOSTASIS) IMPLANT
STAPLER SKIN PROX WIDE 3.9 (STAPLE) IMPLANT
SUT VIC AB 0 CT1 18XCR BRD8 (SUTURE) ×1 IMPLANT
SUT VIC AB 0 CT1 8-18 (SUTURE) ×3
SUT VIC AB 2-0 CT1 18 (SUTURE) ×3 IMPLANT
SUT VIC AB 3-0 SH 8-18 (SUTURE) ×5 IMPLANT
TOWEL GREEN STERILE (TOWEL DISPOSABLE) ×3 IMPLANT
TOWEL GREEN STERILE FF (TOWEL DISPOSABLE) ×3 IMPLANT
WATER STERILE IRR 1000ML POUR (IV SOLUTION) ×3 IMPLANT

## 2020-08-08 NOTE — Evaluation (Signed)
Physical Therapy Evaluation Patient Details Name: Gabriel Richardson MRN: [id number] DOB: 1946/03/13 Today's Date: 08/08/2020   History of Present Illness  The pt is a 75 yo male presenting 3/22 for T2-3 laminectomy. PMH includes: R ankle fx and surgical repair, anxiety, arthritis, bipolar disorder, COPD, Emphysema, and cervical fusion C3-54.    Clinical Impression  Pt in bed upon arrival of PT, agreeable to evaluation at this time. Prior to admission the pt was mobilizing independently or with use of SPC as needed, falling multiple times each month on a regular basis, receiving supervision for ADLs from wife after prior surgeries, and with wife completing all IADLs. The pt now presents with limitations in functional mobility, strength, power, and endurance due to above dx and resulting pain, and will continue to benefit from skilled PT to address these deficits. Eval limited by significant pain at incision site that limited pt ability to ambulate further than within-room distances. The pt was educated on spinal precautions, but then impulsively came to sitting EOB without following any precautions. Pt requesting use of SPC, but requiring minA to power up, steady in standing and then minA with BUE support to initiate gait in the room. The pt ambulates with very short strides, wide BOS, and took frequent standing breaks during short ambulation, minA to steady throughout. The pt will benefit from continued therapies to progress functional understanding of precautions, safe techniques for mobility, and reduce fall risk following surgery.      Follow Up Recommendations Home health PT;Supervision for mobility/OOB    Equipment Recommendations   (pt well equipped)    Recommendations for Other Services       Precautions / Restrictions Precautions Precautions: Fall;Back Precaution Booklet Issued: Yes (comment) Precaution Comments: discussed with pt and wife, pt unable to recall at this time, required  repeated cues to apply to mobility Required Braces or Orthoses:  (no brace needed) Restrictions Weight Bearing Restrictions: No      Mobility  Bed Mobility Overal bed mobility: Needs Assistance Bed Mobility: Rolling;Sidelying to Sit;Sit to Sidelying Rolling: Min assist Sidelying to sit: Min assist;HOB elevated     Sit to sidelying: Min assist;HOB elevated General bed mobility comments: minA to maintain precautions, direct cues for each movement, multimodal cues at times    Transfers Overall transfer level: Needs assistance Equipment used: Straight cane Transfers: Sit to/from Stand Sit to Stand: Mod assist         General transfer comment: modA to power up and steady with SPC, pt requesting to stand from elevated EOB due to pain  Ambulation/Gait Ambulation/Gait assistance: Min assist Gait Distance (Feet): 10 Feet Assistive device: Straight cane;1 person hand held assist Gait Pattern/deviations: Step-to pattern;Decreased stride length;Shuffle;Wide base of support;Trunk flexed Gait velocity: decreased   General Gait Details: pt with wide BOS, reaching for bilateral UE support, small steps with minimal clearance. Pt reporting unable to continue after 5 ft due to pain at incision site. returned to ebd to recieve pain medicince.         Balance Overall balance assessment: Needs assistance Sitting-balance support: No upper extremity supported;Feet supported Sitting balance-Leahy Scale: Fair     Standing balance support: Bilateral upper extremity supported Standing balance-Leahy Scale: Poor Standing balance comment: reliant on BUE support                             Pertinent Vitals/Pain Pain Assessment: Faces Faces Pain Scale: Hurts whole lot Pain Location: incision  site Pain Descriptors / Indicators: Discomfort;Operative site guarding Pain Intervention(s): Limited activity within patient's tolerance;Monitored during session;Patient requesting pain  meds-RN notified;RN gave pain meds during session;Repositioned    Home Living Family/patient expects to be discharged to:: Private residence Living Arrangements: Spouse/significant other Available Help at Discharge: Available 24 hours/day Type of Home: House Home Access: Level entry     Home Layout: One level Home Equipment: Walker - 2 wheels;Cane - single point;Shower seat;Grab bars - tub/shower      Prior Function Level of Independence: Needs assistance   Gait / Transfers Assistance Needed: pt reports he is able to ambulate with SPC at home, but at times states he was not using any AD prior to surgery  ADL's / Homemaking Assistance Needed: wife reports she completes all IADLs, and has been supervising all ADLs since prior surgeries. Pt states he does not need anyone to help with ADLs        Hand Dominance   Dominant Hand: Right    Extremity/Trunk Assessment   Upper Extremity Assessment Upper Extremity Assessment: Defer to OT evaluation    Lower Extremity Assessment Lower Extremity Assessment: Overall WFL for tasks assessed (pt initially reported numbness in BLE, then reported no numbness in BLE following surgery)    Cervical / Trunk Assessment Cervical / Trunk Assessment: Other exceptions Cervical / Trunk Exceptions: s/p thoracic spine surgery  Communication   Communication: No difficulties  Cognition Arousal/Alertness: Awake/alert Behavior During Therapy: WFL for tasks assessed/performed;Impulsive Overall Cognitive Status: Impaired/Different from baseline Area of Impairment: Safety/judgement;Problem solving                         Safety/Judgement: Decreased awareness of safety;Decreased awareness of deficits   Problem Solving: Difficulty sequencing;Requires verbal cues General Comments: pt impulsively moving without regards to instructions regarding spinal precautions for bed mobility and with gait. requires frequent reorientation to task, but may be  more personality than cog deficit      General Comments General comments (skin integrity, edema, etc.): pt with poor insight to deficits, safety following surgery. educated pt and his wife on movement restrictions following surgery, wife expressed verbal understanding.    Exercises     Assessment/Plan    PT Assessment Patient needs continued PT services  PT Problem List Decreased activity tolerance;Decreased balance;Decreased mobility;Decreased coordination;Decreased safety awareness;Decreased knowledge of use of DME;Pain       PT Treatment Interventions DME instruction;Gait training;Stair training;Functional mobility training;Therapeutic activities;Therapeutic exercise;Balance training;Patient/family education    PT Goals (Current goals can be found in the Care Plan section)  Acute Rehab PT Goals Patient Stated Goal: return home PT Goal Formulation: With patient Time For Goal Achievement: 08/22/20 Potential to Achieve Goals: Good    Frequency Min 5X/week    AM-PAC PT "6 Clicks" Mobility  Outcome Measure Help needed turning from your back to your side while in a flat bed without using bedrails?: A Little Help needed moving from lying on your back to sitting on the side of a flat bed without using bedrails?: A Little Help needed moving to and from a bed to a chair (including a wheelchair)?: A Lot Help needed standing up from a chair using your arms (e.g., wheelchair or bedside chair)?: A Little Help needed to walk in hospital room?: A Little Help needed climbing 3-5 steps with a railing? : A Lot 6 Click Score: 16    End of Session Equipment Utilized During Treatment: Gait belt Activity Tolerance: Patient limited by pain  Patient left: in bed;with call bell/phone within reach;with family/visitor present Nurse Communication: Patient requests pain meds PT Visit Diagnosis: Unsteadiness on feet (R26.81);Other abnormalities of gait and mobility (R26.89);Pain Pain - part of body:   (back)    Time: 2575-0518 PT Time Calculation (min) (ACUTE ONLY): 32 min   Charges:   PT Evaluation $PT Eval Low Complexity: 1 Low PT Treatments $Gait Training: 8-22 mins        Karma Ganja, PT, DPT   Acute Rehabilitation Department Pager #: [phone]  Otho Bellows 08/08/2020, 5:47 PM

## 2020-08-08 NOTE — Interval H&P Note (Signed)
History and Physical Interval Note:  08/08/2020 9:53 AM  Gabriel Richardson  has presented today for surgery, with the diagnosis of Thoracic spinal stenosis.  The various methods of treatment have been discussed with the patient and family. After consideration of risks, benefits and other options for treatment, the patient has consented to  Procedure(s) with comments: Thoracic 2-3 Laminectomy (N/A) - 3C/RM 20 as a surgical intervention.  The patient's history has been reviewed, patient examined, no change in status, stable for surgery.  I have reviewed the patient's chart and labs.  Questions were answered to the patient's satisfaction.     Peggyann Shoals

## 2020-08-08 NOTE — Progress Notes (Signed)
Patient is awake, alert, conversant.  MAEW with good strength.  Patient is doing well.

## 2020-08-08 NOTE — Op Note (Signed)
08/08/2020  12:33 PM  PATIENT:  Gabriel Richardson  75 y.o. 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, 3 level   PROCEDURE:  Procedure(s): Thoracic two- thoracic three Laminectomy (N/A) with resection of synovial cyst  SURGEON:  Surgeon(s) and Role:    * Celena Lanius, MD - Primary  PHYSICIAN ASSISTANT: McDaniel, NP  ASSISTANTS: Poteat, RN   ANESTHESIA:   general  EBL:  150 mL   BLOOD ADMINISTERED:none  DRAINS: (Medium ) Hemovact drain(s) in the epidural space with  Suction Open   LOCAL MEDICATIONS USED:  MARCAINE    and LIDOCAINE   SPECIMEN:  No Specimen  DISPOSITION OF SPECIMEN:  N/A  COUNTS:  YES  TOURNIQUET:  * No tourniquets in log *  DICTATION: Patient has spinal cord compression at T 23 due to a synovial cyst and acquired spinal stenosis.  He has a thoracic myelopathy.  . It was elected to take him to surgery for T 23 laminectomy and resection of synovial cyst.  Procedure: Patient was brought to the operating room and following the smooth and uncomplicated induction of general endotracheal anesthesia he was placed in a prone position on chest rolls in 3-pin [place] head fixation using the radiolucent head holder. C arm was used to localize the T 2 - T 3 level.  His back was prepped and draped in the usual sterile fashion with betadine scrub and DuraPrep. Area of planned incision was infiltrated with local lidocaine. Incision was made in the midline and carried to the lumbodorsal fascia which was incised on both sides of midline. Exposure of the T 2 and T 3 levels was confirmed with C arm.Spinous processes of T 2 and T 3 were removed. The high speed drill was used to create a laminectomy defect of T 2 and T 3. 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  At this point it was felt that all neural elements were well decompressed. The wound was then irrigated with saline. Hemostasis was assured with bipolar electrocautery and Surgifoam. A medium Hemovac drain was inserted through a separate stab incision.  The lumbodorsal fascia was closed with 0 Vicryl sutures the subcutaneous tissues reapproximated 2-0 Vicryl inverted sutures and the skin edges were reapproximated with 3-0 Vicryl subcuticular stitch. The wound is dressed with demabond and an occlusive dressing. Patient was extubated in the operating room and taken to recovery in stable and satisfactory condition having tolerated her operation well counts were correct at the end of the case.  PLAN OF CARE: Admit for overnight observation  PATIENT DISPOSITION:  PACU - hemodynamically stable.   Delay start of Pharmacological VTE agent (>24hrs) due to surgical blood loss or risk of bleeding: yes  

## 2020-08-08 NOTE — Anesthesia Procedure Notes (Signed)
Procedure Name: Intubation Date/Time: 08/08/2020 10:49 AM Performed by: Eligha Bridegroom, CRNA Pre-anesthesia Checklist: Patient identified, Emergency Drugs available, Suction available, Patient being monitored and Timeout performed Patient Re-evaluated:Patient Re-evaluated prior to induction Oxygen Delivery Method: Circle system utilized Preoxygenation: Pre-oxygenation with 100% oxygen Induction Type: IV induction Ventilation: Mask ventilation without difficulty and Oral airway inserted - appropriate to patient size Laryngoscope Size: Glidescope Grade View: Grade I Tube type: Oral Tube size: 7.5 mm Number of attempts: 1 Placement Confirmation: ETT inserted through vocal cords under direct vision,  positive ETCO2 and breath sounds checked- equal and bilateral Secured at: 22 cm Tube secured with: Tape Dental Injury: Teeth and Oropharynx as per pre-operative assessment

## 2020-08-08 NOTE — Anesthesia Postprocedure Evaluation (Signed)
Anesthesia Post Note  Patient: Gabriel Richardson  Procedure(s) Performed: Thoracic two- thoracic three Laminectomy (N/A Spine Thoracic)     Patient location during evaluation: PACU Anesthesia Type: General Level of consciousness: awake and alert Pain management: pain level controlled Vital Signs Assessment: post-procedure vital signs reviewed and stable Respiratory status: spontaneous breathing, nonlabored ventilation, respiratory function stable and patient connected to nasal cannula oxygen Cardiovascular status: blood pressure returned to baseline and stable Postop Assessment: no apparent nausea or vomiting Anesthetic complications: no   No complications documented.  Last Vitals:  Vitals:   08/08/20 1751 08/08/20 1943  BP: (!) 143/88 135/82  Pulse: 66 69  Resp: 18 18  Temp: 36.6 C 36.9 C  SpO2: 98% 96%    Last Pain:  Vitals:   08/08/20 2052  TempSrc:   PainSc: [place]

## 2020-08-08 NOTE — Brief Op Note (Signed)
08/08/2020  12:33 PM  PATIENT:  Gabriel Richardson  75 y.o. 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, 3 level   PROCEDURE:  Procedure(s): Thoracic two- thoracic three Laminectomy (N/A) with resection of synovial cyst  SURGEON:  Surgeon(s) and Role:    Erline Levine, MD - Primary  PHYSICIAN ASSISTANT: Glenford Peers, NP  ASSISTANTS: [place], RN   ANESTHESIA:   general  EBL:  150 mL   BLOOD ADMINISTERED:none  DRAINS: (Medium ) Hemovact drain(s) in the epidural space with  Suction Open   LOCAL MEDICATIONS USED:  MARCAINE    and LIDOCAINE   SPECIMEN:  No Specimen  DISPOSITION OF SPECIMEN:  N/A  COUNTS:  YES  TOURNIQUET:  * No tourniquets in log *  DICTATION: Patient has spinal cord compression at T 23 due to a synovial cyst and acquired spinal stenosis.  He has a thoracic myelopathy.  . It was elected to take him to surgery for T 23 laminectomy and resection of synovial cyst.  Procedure: Patient was brought to the operating room and following the smooth and uncomplicated induction of general endotracheal anesthesia he was placed in a prone position on chest rolls in 3-pin [place] head fixation using the radiolucent head holder. C arm was used to localize the T 2 - T 3 level.  His back was prepped and draped in the usual sterile fashion with betadine scrub and DuraPrep. Area of planned incision was infiltrated with local lidocaine. Incision was made in the midline and carried to the lumbodorsal fascia which was incised on both sides of midline. Exposure of the T 2 and T 3 levels was confirmed with C arm.Spinous processes of T 2 and T 3 were removed. The high speed drill was used to create a laminectomy defect of T 2 and T 3. 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  At this point it was felt that all neural elements were well decompressed. The wound was then irrigated with saline. Hemostasis was assured with bipolar electrocautery and Surgifoam. A medium Hemovac drain was inserted through a separate stab incision.  The lumbodorsal fascia was closed with 0 Vicryl sutures the subcutaneous tissues reapproximated 2-0 Vicryl inverted sutures and the skin edges were reapproximated with 3-0 Vicryl subcuticular stitch. The wound is dressed with demabond and an occlusive dressing. Patient was extubated in the operating room and taken to recovery in stable and satisfactory condition having tolerated her operation well counts were correct at the end of the case.  PLAN OF CARE: Admit for overnight observation  PATIENT DISPOSITION:  PACU - hemodynamically stable.   Delay start of Pharmacological VTE agent (>24hrs) due to surgical blood loss or risk of bleeding: yes

## 2020-08-08 NOTE — Anesthesia Preprocedure Evaluation (Addendum)
Anesthesia Evaluation  Patient identified by MRN, date of birth, ID band Patient awake    Reviewed: Allergy & Precautions, NPO status , Patient's Chart, lab work & pertinent test results, reviewed documented beta blocker date and time   Airway Mallampati: II  TM Distance: >3 FB Neck ROM: Limited    Dental  (+) Teeth Intact, Dental Advisory Given, Implants,    Pulmonary asthma , COPD, former smoker,    Pulmonary exam normal breath sounds clear to auscultation       Cardiovascular hypertension, Pt. on home beta blockers (-) angina(-) Past MI Normal cardiovascular exam Rhythm:Regular Rate:Normal     Neuro/Psych PSYCHIATRIC DISORDERS Anxiety Depression Bipolar Disorder Thoracic spinal stenosis S/p ACDF  Neuromuscular disease    GI/Hepatic negative GI ROS, Neg liver ROS,   Endo/Other  negative endocrine ROS  Renal/GU negative Renal ROS     Musculoskeletal  (+) Arthritis ,   Abdominal   Peds  Hematology negative hematology ROS (+)   Anesthesia Other Findings Day of surgery medications reviewed with the patient.  Reproductive/Obstetrics                             Anesthesia Physical Anesthesia Plan  ASA: III  Anesthesia Plan: General   Post-op Pain Management:    Induction: Intravenous  PONV Risk Score and Plan: 3 and Dexamethasone, Ondansetron and Treatment may vary due to age or medical condition  Airway Management Planned: Oral ETT and Video Laryngoscope Planned  Additional Equipment:   Intra-op Plan:   Post-operative Plan: Extubation in OR  Informed Consent: I have reviewed the patients History and Physical, chart, labs and discussed the procedure including the risks, benefits and alternatives for the proposed anesthesia with the patient or authorized representative who has indicated his/her understanding and acceptance.     Dental advisory given  Plan Discussed with:  CRNA  Anesthesia Plan Comments: (2nd PIV)        Anesthesia Quick Evaluation

## 2020-08-08 NOTE — Transfer of Care (Signed)
Immediate Anesthesia Transfer of Care Note  Patient: Gabriel Richardson  Procedure(s) Performed: Thoracic two- thoracic three Laminectomy (N/A Spine Thoracic)  Patient Location: PACU  Anesthesia Type:General  Level of Consciousness: drowsy  Airway & Oxygen Therapy: Patient Spontanous Breathing and Patient connected to nasal cannula oxygen  Post-op Assessment: Report given to RN and Post -op Vital signs reviewed and stable  Post vital signs: Reviewed and stable  Last Vitals:  Vitals Value Taken Time  BP 158/78 08/08/20 1247  Temp    Pulse 69 08/08/20 1248  Resp 12 08/08/20 1248  SpO2 95 % 08/08/20 1248  Vitals shown include unvalidated device data.  Last Pain:  Vitals:   08/08/20 0934  TempSrc:   PainSc: 8       Patients Stated Pain Goal: 3 (16/38/46 6599)  Complications: No complications documented.

## 2020-08-09 ENCOUNTER — Encounter (HOSPITAL_COMMUNITY): Payer: Self-pay | Admitting: Neurosurgery

## 2020-08-09 DIAGNOSIS — R030 Elevated blood-pressure reading, without diagnosis of hypertension: Secondary | ICD-10-CM | POA: Diagnosis not present

## 2020-08-09 DIAGNOSIS — F329 Major depressive disorder, single episode, unspecified: Secondary | ICD-10-CM | POA: Diagnosis not present

## 2020-08-09 DIAGNOSIS — J45909 Unspecified asthma, uncomplicated: Secondary | ICD-10-CM | POA: Diagnosis not present

## 2020-08-09 DIAGNOSIS — G9520 Unspecified cord compression: Secondary | ICD-10-CM | POA: Diagnosis not present

## 2020-08-09 DIAGNOSIS — M4804 Spinal stenosis, thoracic region: Secondary | ICD-10-CM | POA: Diagnosis not present

## 2020-08-09 DIAGNOSIS — M7138 Other bursal cyst, other site: Secondary | ICD-10-CM | POA: Diagnosis not present

## 2020-08-09 DIAGNOSIS — G8929 Other chronic pain: Secondary | ICD-10-CM | POA: Diagnosis not present

## 2020-08-09 DIAGNOSIS — G4700 Insomnia, unspecified: Secondary | ICD-10-CM | POA: Diagnosis not present

## 2020-08-09 DIAGNOSIS — J449 Chronic obstructive pulmonary disease, unspecified: Secondary | ICD-10-CM | POA: Diagnosis not present

## 2020-08-09 DIAGNOSIS — Z79899 Other long term (current) drug therapy: Secondary | ICD-10-CM | POA: Diagnosis not present

## 2020-08-09 DIAGNOSIS — M4714 Other spondylosis with myelopathy, thoracic region: Secondary | ICD-10-CM | POA: Diagnosis not present

## 2020-08-09 MED ORDER — OXYCODONE-ACETAMINOPHEN 5-325 MG PO TABS: 1.0000 | ORAL_TABLET | ORAL | 0 refills | Status: AC | PRN

## 2020-08-09 MED FILL — Thrombin For Soln Kit 5000 Unit: CUTANEOUS | Qty: 1 | Status: AC

## 2020-08-09 NOTE — Progress Notes (Signed)
Subjective: Patient reports that he is doing well, pleased with his postoperative status. He has no complaints. No acute events overnight.   Objective: Vital signs in last 24 hours: Temp:  [97.9 F (36.6 C)-98.4 F (36.9 C)] 97.9 F (36.6 C) (03/23 0722) Pulse Rate:  [66-74] 74 (03/23 0722) Resp:  [17-20] 18 (03/23 0722) BP: (113-158)/(41-93) 113/93 (03/23 0722) SpO2:  [94 %-100 %] 96 % (03/23 0722) Weight:  [91.6 kg] 91.6 kg (03/22 0819)  Intake/Output from previous day: 03/22 0701 - 03/23 0700 In: 1960 [P.O.:360; I.V.:1500; IV Piggyback:100] Out: 340 [Drains:190; Blood:150] Intake/Output this shift: No intake/output data recorded.  Physical Exam: Patient is awake, A/O X 4, conversant, and in good spirits. Speech is fluent and appropriate. Doing well. MAEW with good strength that is symmetric bilaterally. Dressing is clean dry intact. Incision is well approximated with no drainage, erythema, or edema. Drain with minimal output overnight, now removed.     Lab Results: Recent Labs    08/08/20 0834  WBC 5.5  HGB 13.9  HCT 41.3  PLT 193   BMET Recent Labs    08/08/20 0834  NA 136  K 4.2  CL 103  CO2 25  GLUCOSE 106*  BUN 9  CREATININE 0.61  CALCIUM 8.8*    Studies/Results: DG Thoracic Spine 2 View  Result Date: 08/08/2020 CLINICAL DATA:  T2-3 laminectomy EXAM: DG C-ARM 1-60 MIN; THORACIC SPINE 2 VIEWS COMPARISON:  07/06/2020 FLUOROSCOPY TIME:  Fluoroscopy Time:  9 seconds Radiation Exposure Index (if provided by the fluoroscopic device): 2.18 mGy Number of Acquired Spot Images: 3 FINDINGS: Initial images demonstrate posterior localization over the T2-T3 interspace. Subsequent surgical retractors and instruments are noted. IMPRESSION: Intraoperative localization for upper thoracic laminectomy. Electronically Signed   By: Inez Catalina M.D.   On: 08/08/2020 15:16   DG C-Arm 1-60 Min  Result Date: 08/08/2020 CLINICAL DATA:  T2-3 laminectomy EXAM: DG C-ARM 1-60 MIN;  THORACIC SPINE 2 VIEWS COMPARISON:  07/06/2020 FLUOROSCOPY TIME:  Fluoroscopy Time:  9 seconds Radiation Exposure Index (if provided by the fluoroscopic device): 2.18 mGy Number of Acquired Spot Images: 3 FINDINGS: Initial images demonstrate posterior localization over the T2-T3 interspace. Subsequent surgical retractors and instruments are noted. IMPRESSION: Intraoperative localization for upper thoracic laminectomy. Electronically Signed   By: Inez Catalina M.D.   On: 08/08/2020 15:16    Assessment/Plan: Patient is post-op day 1 s/p T2/3 laminectomy with resection of synovial cyst. He is recovering well and reports a resolution of his preoperative symptoms.  His only complaint is mild incisional discomfort that is well controlled with PO analgesics.  He has ambulated with nursing staff and is awaiting occupational therapy and physical therapy evaluation. Drain removed this morning.  Continue working on pain control, mobility and ambulating patient. Will plan for discharge today.    Will send Percocet 5/325 Q4-6H PRN into the patient's pharmacy. He declined muscle relaxer.     LOS: 0 days     Marvis Moeller, DNP, NP-C 08/09/2020, 7:58 AM

## 2020-08-09 NOTE — Progress Notes (Signed)
Physical Therapy Treatment Patient Details Name: Gabriel Richardson MRN: [id number] DOB: December 11, 1945 Today's Date: 08/09/2020    History of Present Illness The pt is a 75 yo male presenting 3/22 for T2-3 laminectomy. PMH includes: R ankle fx and surgical repair, anxiety, arthritis, bipolar disorder, COPD, Emphysema, and cervical fusion C3-54.    PT Comments    Pt progressing well with post-op mobility. He was able to demonstrate transfers and ambulation with gross min guard assist and RW for support. Pt was educated on precautions, appropriate activity progression, and car transfer. Will continue to follow.      Follow Up Recommendations  Home health PT;Supervision for mobility/OOB     Equipment Recommendations   (pt well equipped)    Recommendations for Other Services       Precautions / Restrictions Precautions Precautions: Fall;Back Precaution Booklet Issued: No Precaution Comments: VC's for maintenance of precautions during functional mobility. Able to recall 2/3. Required Braces or Orthoses:  (no brace needed order) Restrictions Weight Bearing Restrictions: No    Mobility  Bed Mobility Overal bed mobility: Needs Assistance Bed Mobility: Rolling;Sidelying to Sit;Sit to Sidelying Rolling: Supervision Sidelying to sit: Supervision       General bed mobility comments: Pt received sitting up in the recliner.    Transfers Overall transfer level: Needs assistance Equipment used: Rolling walker (2 wheeled) Transfers: Sit to/from Stand Sit to Stand: Min guard         General transfer comment: VC's for improved posture and safety as pt powered up to full standing position.  Ambulation/Gait Ambulation/Gait assistance: Min guard;Min assist Gait Distance (Feet): 200 Feet Assistive device: Rolling walker (2 wheeled) Gait Pattern/deviations: Step-to pattern;Decreased stride length;Shuffle;Wide base of support;Trunk flexed Gait velocity: decreased Gait velocity  interpretation: <1.31 ft/sec, indicative of household ambulator General Gait Details: Occasional assist for walker management. VC's throughout for closer walker proximity, forward gaze, and improved posture.   Stairs             Wheelchair Mobility    Modified Rankin (Stroke Patients Only)       Balance Overall balance assessment: Needs assistance Sitting-balance support: No upper extremity supported;Feet supported Sitting balance-Leahy Scale: Fair     Standing balance support: Bilateral upper extremity supported Standing balance-Leahy Scale: Poor Standing balance comment: reliant on BUE support on RW.                            Cognition Arousal/Alertness: Awake/alert Behavior During Therapy: WFL for tasks assessed/performed;Impulsive Overall Cognitive Status: Impaired/Different from baseline Area of Impairment: Safety/judgement;Problem solving                         Safety/Judgement: Decreased awareness of safety;Decreased awareness of deficits   Problem Solving: Difficulty sequencing;Requires verbal cues General Comments: Patient with continued impulsivity and decreased safety awareness.      Exercises      General Comments General comments (skin integrity, edema, etc.): No family present at bedside at time of evaluation.      Pertinent Vitals/Pain Pain Assessment: No/denies pain Pain Intervention(s): Monitored during session    Home Living Family/patient expects to be discharged to:: Private residence Living Arrangements: Spouse/significant other Available Help at Discharge: Available 24 hours/day Type of Home: House Home Access: Level entry   Home Layout: One level Home Equipment: Environmental consultant - 2 wheels;Cane - single point;Shower seat;Grab bars - tub/shower;Bedside commode (BSC x2)      Prior  Function Level of Independence: Needs assistance  Gait / Transfers Assistance Needed: Patient reports intermittent use of SPC. ADL's /  Homemaking Assistance Needed: Per chart review, wife provides supervision A for all ADLs.     PT Goals (current goals can now be found in the care plan section) Acute Rehab PT Goals Patient Stated Goal: Home today PT Goal Formulation: With patient Time For Goal Achievement: 08/22/20 Potential to Achieve Goals: Good Progress towards PT goals: Progressing toward goals    Frequency    Min 5X/week      PT Plan Current plan remains appropriate    Co-evaluation              AM-PAC PT "6 Clicks" Mobility   Outcome Measure  Help needed turning from your back to your side while in a flat bed without using bedrails?: A Little Help needed moving from lying on your back to sitting on the side of a flat bed without using bedrails?: A Little Help needed moving to and from a bed to a chair (including a wheelchair)?: A Little Help needed standing up from a chair using your arms (e.g., wheelchair or bedside chair)?: A Little Help needed to walk in hospital room?: A Little Help needed climbing 3-5 steps with a railing? : A Little 6 Click Score: 18    End of Session Equipment Utilized During Treatment: Gait belt Activity Tolerance: Patient tolerated treatment well Patient left: in chair;with call bell/phone within reach;with family/visitor present Nurse Communication: Mobility status PT Visit Diagnosis: Unsteadiness on feet (R26.81);Other abnormalities of gait and mobility (R26.89);Pain Pain - part of body:  (back)     Time: 2505-3976 PT Time Calculation (min) (ACUTE ONLY): 12 min  Charges:  $Gait Training: 8-22 mins                     Gabriel Richardson, PT, DPT Acute Rehabilitation Services Pager: [phone] Office: [phone]    Gabriel Richardson 08/09/2020, 9:31 AM

## 2020-08-09 NOTE — Plan of Care (Signed)
Adequately ready for discharge 

## 2020-08-09 NOTE — Discharge Summary (Signed)
Physician Discharge Summary  Patient ID: Gabriel Richardson MRN: [id number] DOB/AGE: Oct 01, 1945 75 y.o.  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 The patient tolerated the procedure well and was taken to the recovery room and then to the floor in stable condition. The hospital course was routine. There were no complications. The wound remained clean dry and intact. Pt had appropriate back soreness. No complaints of leg pain or new N/T/W. The patient remained afebrile with stable vital signs, and tolerated a regular diet. The patient continued to increase activities, and pain was well controlled with oral pain medications.   Consults: None  Significant Diagnostic Studies: radiology: X-ray  Treatments: surgery: T2/3 laminectomy with resection of synovial cyst  Discharge Exam: Blood pressure (!) 113/93, pulse 74, temperature 97.9 F (36.6 C), resp. rate 18, height 5\' 10"  (1.778 m), weight 91.6 kg, SpO2 96 %.  Physical Exam: Patient is awake, A/O X 4, conversant, and in good spirits. Speech is fluent and appropriate. Doing well. MAEW with good strength that is symmetric bilaterally. Dressing is clean dry intact. Incision is well approximated with no drainage, erythema, or edema.  Disposition: Discharge disposition: 01-Home or Self Care        Allergies as of 08/09/2020   No Known Allergies     Medication List    STOP taking these medications   oxyCODONE 5 MG immediate  release tablet Commonly known as: Oxy IR/ROXICODONE     TAKE these medications   acetaminophen 500 MG tablet Commonly known as: TYLENOL Take 1,000 mg by mouth every 6 (six) hours as needed for mild pain or headache.   lamoTRIgine 200 MG tablet Commonly known as: LAMICTAL Take 200 mg by mouth at bedtime.   oxyCODONE-acetaminophen 5-325 MG tablet Commonly known as: Percocet Take 1 tablet by mouth every 4 (four) hours as needed for severe pain.   primidone 50 MG tablet Commonly known as: MYSOLINE TAKE TWO TABLETS (100MG ) BY MOUTH TWICE DAILY What changed:   how much to take  how to take this  when to take this   propranolol ER 120 MG 24 hr capsule Commonly known as: INDERAL LA Take 1 capsule (120 mg total) by mouth 2 (two) times daily. OK to take extra tablet as needed for worsening tremor.   sildenafil 100 MG tablet Commonly known as: Viagra Take 1 tablet (100 mg total) by mouth daily as needed for erectile dysfunction.   zolpidem 10 MG tablet Commonly known as: AMBIEN Take 10 mg by mouth at bedtime.       Follow-up Information    Erline Levine, MD Follow up.   Specialty: Neurosurgery Contact information: [address] [place] [address] [phone]               Signed: Marvis Moeller, DNP, NP-C 08/09/2020, 8:31 AM

## 2020-08-09 NOTE — Discharge Instructions (Signed)
Wound Care Remove outer dressing in 3 days Leave incision open to air. You may shower. Do not scrub directly on incision.  Do not put any creams, lotions, or ointments on incision. Activity Walk each and every day, increasing distance each day. No lifting greater than 5 lbs.  Avoid bending, arching, and twisting. No driving for [place]; may ride as a passenger locally. If provided with back brace, wear when out of bed.  It is not necessary to wear in bed. Diet Resume your normal diet.  Return to Work Will be discussed at you follow up appointment. Call Your Doctor If Any of These Occur Redness, drainage, or swelling at the wound.  Temperature greater than 101 degrees. Severe pain not relieved by pain medication. Incision starts to come apart. Follow Up Appt Call today for appointment in [place] ([phone]) or for problems.  If you have any hardware placed in your spine, you will need an x-ray before your appointment.  

## 2020-08-09 NOTE — Progress Notes (Signed)
Patient alert and oriented, voiding adequately, MAE well with no difficulty. Incision area cdi with no s/s of infection. Patient discharged home per order. Patient and spouse stated understanding of discharge instructions given and also stated he does not want any home health services, and that his wife will be his caregiver. MD notified. Patient has an appointment with Dr. Vertell Limber

## 2020-08-09 NOTE — Evaluation (Signed)
Occupational Therapy Evaluation Patient Details Name: Gabriel Richardson MRN: [id number] DOB: 13-May-1946 Today's Date: 08/09/2020    History of Present Illness The pt is a 75 yo male presenting 3/22 for T2-3 laminectomy. PMH includes: R ankle fx and surgical repair, anxiety, arthritis, bipolar disorder, COPD, Emphysema, and cervical fusion C3-54.   Clinical Impression   PTA patient was living with his wife in a private residence and was requiring some assist with ADLs/IADLs with and without use of SPC. Patient states that his wife provides a significant amount of assist with IADLs. Patient currently presents below baseline level of function demonstrating Min guard to Min A grossly for observed ADLs with use of RW. OT provided education on spinal precautions, home set-up to maximize safety and independence with self-care tasks, and acquisition/use of AE. Patient continues to require cues for adherence to back precautions during functional activities with ability to recall 1/3 at conclusion of session and would benefit from further education. OT will continue to follow acutely. Given CLOF and increase fall risk, recommendation for HHOT and 24hr supervision/assist.    Follow Up Recommendations  Home health OT;Supervision/Assistance - 24 hour    Equipment Recommendations  None recommended by OT (Patient has necessary DME.)    Recommendations for Other Services       Precautions / Restrictions Precautions Precautions: Fall;Back Precaution Booklet Issued: No Precaution Comments: Verbally reviewed spinal preacutions. Patient only able to recall 1/3 at conclusion of evaluation. Would benefit from further education. Required Braces or Orthoses:  (no brace needed) Restrictions Weight Bearing Restrictions: No      Mobility Bed Mobility Overal bed mobility: Needs Assistance Bed Mobility: Rolling;Sidelying to Sit;Sit to Sidelying Rolling: Supervision Sidelying to sit: Supervision       General  bed mobility comments: Patient with poor carryover of log rolling technique even after education. Requires cues for sequencing. No external assist required.    Transfers Overall transfer level: Needs assistance Equipment used: Rolling walker (2 wheeled) Transfers: Sit to/from Stand Sit to Stand: Min guard         General transfer comment: Min guard for sit to stand from EOB for steadying x2 trials. Cues for hand placement.    Balance Overall balance assessment: Needs assistance Sitting-balance support: No upper extremity supported;Feet supported Sitting balance-Leahy Scale: Fair     Standing balance support: Bilateral upper extremity supported Standing balance-Leahy Scale: Poor Standing balance comment: reliant on BUE support on RW.                           ADL either performed or assessed with clinical judgement   ADL Overall ADL's : Needs assistance/impaired                 Upper Body Dressing : Set up;Sitting   Lower Body Dressing: Min guard;Sit to/from stand;Cueing for back precautions Lower Body Dressing Details (indicate cue type and reason): Requires cues for adherence to back precautions. Increased time/effort. Toilet Transfer: Consulting civil engineer Details (indicate cue type and reason): Simulated with transfer to recliner with use of RW and supervision A for safety.         Functional mobility during ADLs: Supervision/safety;Rolling walker       Vision Baseline Vision/History: Wears glasses Wears Glasses: At all times Patient Visual Report: No change from baseline       Perception     Praxis      Pertinent Vitals/Pain Pain Assessment: No/denies pain  Hand Dominance Right   Extremity/Trunk Assessment Upper Extremity Assessment Upper Extremity Assessment: Overall WFL for tasks assessed   Lower Extremity Assessment Lower Extremity Assessment: Defer to PT evaluation   Cervical / Trunk Assessment Cervical /  Trunk Assessment: Other exceptions Cervical / Trunk Exceptions: s/p thoracic spine surgery   Communication Communication Communication: No difficulties   Cognition Arousal/Alertness: Awake/alert Behavior During Therapy: WFL for tasks assessed/performed;Impulsive Overall Cognitive Status: Impaired/Different from baseline Area of Impairment: Safety/judgement;Problem solving                         Safety/Judgement: Decreased awareness of safety;Decreased awareness of deficits   Problem Solving: Difficulty sequencing;Requires verbal cues General Comments: Patient with continued impulsivity. Decreased STM with ability to recall only 1/3 back precautions after re-education.   General Comments  No family present at bedside at time of evaluation.    Exercises     Shoulder Instructions      Home Living Family/patient expects to be discharged to:: Private residence Living Arrangements: Spouse/significant other Available Help at Discharge: Available 24 hours/day Type of Home: House Home Access: Level entry     Home Layout: One level     Bathroom Shower/Tub: Teacher, early years/pre: Standard     Home Equipment: Environmental consultant - 2 wheels;Cane - single point;Shower seat;Grab bars - tub/shower;Bedside commode (BSC x2)          Prior Functioning/Environment Level of Independence: Needs assistance  Gait / Transfers Assistance Needed: Patient reports intermittent use of SPC. ADL's / Homemaking Assistance Needed: Per chart review, wife provides supervision A for all ADLs.            OT Problem List: Pain;Impaired balance (sitting and/or standing);Decreased safety awareness;Decreased cognition      OT Treatment/Interventions: Self-care/ADL training;Therapeutic exercise;Energy conservation;DME and/or AE instruction;Therapeutic activities;Cognitive remediation/compensation;Patient/family education;Balance training    OT Goals(Current goals can be found in the care  plan section) Acute Rehab OT Goals Patient Stated Goal: To return home. OT Goal Formulation: With patient Time For Goal Achievement: 08/23/20 Potential to Achieve Goals: Good ADL Goals Additional ADL Goal #1: Patient will recall 3/3 back precautions in prep for ADLs. Additional ADL Goal #2: Patient will complete a.m. ADLs with supervision A, LRAD and good carryover of spinal precautions with AE PRN. Additional ADL Goal #3: Patient will recall 3 fall reducing techniques in prep for safe return home.  OT Frequency: Min 2X/week   Barriers to D/C:            Co-evaluation              AM-PAC OT "6 Clicks" Daily Activity     Outcome Measure Help from another person eating meals?: None Help from another person taking care of personal grooming?: A Little Help from another person toileting, which includes using toliet, bedpan, or urinal?: A Little Help from another person bathing (including washing, rinsing, drying)?: A Little Help from another person to put on and taking off regular upper body clothing?: A Little Help from another person to put on and taking off regular lower body clothing?: A Little 6 Click Score: 19   End of Session Equipment Utilized During Treatment: Rolling walker Nurse Communication: Mobility status  Activity Tolerance: Patient tolerated treatment well Patient left: in chair;with call bell/phone within reach  OT Visit Diagnosis: Unsteadiness on feet (R26.81);Muscle weakness (generalized) (M62.81);History of falling (Z91.81)  Time: 4103-0131 OT Time Calculation (min): 17 min Charges:  OT General Charges $OT Visit: 1 Visit OT Evaluation $OT Eval Moderate Complexity: 1 Mod  Analiah Drum H. OTR/L Supplemental OT, Department of rehab services [phone]  Kadisha Goodine R H. 08/09/2020, 8:35 AM

## 2020-08-31 DIAGNOSIS — F329 Major depressive disorder, single episode, unspecified: Secondary | ICD-10-CM | POA: Diagnosis not present

## 2020-08-31 DIAGNOSIS — J449 Chronic obstructive pulmonary disease, unspecified: Secondary | ICD-10-CM | POA: Diagnosis not present

## 2020-08-31 DIAGNOSIS — J45909 Unspecified asthma, uncomplicated: Secondary | ICD-10-CM | POA: Diagnosis not present

## 2020-08-31 DIAGNOSIS — G8929 Other chronic pain: Secondary | ICD-10-CM | POA: Diagnosis not present

## 2020-08-31 DIAGNOSIS — G4700 Insomnia, unspecified: Secondary | ICD-10-CM | POA: Diagnosis not present

## 2020-09-11 DIAGNOSIS — J449 Chronic obstructive pulmonary disease, unspecified: Secondary | ICD-10-CM | POA: Diagnosis not present

## 2020-09-11 DIAGNOSIS — R61 Generalized hyperhidrosis: Secondary | ICD-10-CM | POA: Diagnosis not present

## 2020-09-11 DIAGNOSIS — M4807 Spinal stenosis, lumbosacral region: Secondary | ICD-10-CM | POA: Diagnosis not present

## 2020-09-14 ENCOUNTER — Encounter: Payer: Self-pay | Admitting: Gastroenterology

## 2020-09-26 DIAGNOSIS — F319 Bipolar disorder, unspecified: Secondary | ICD-10-CM | POA: Diagnosis not present

## 2020-09-27 DIAGNOSIS — G8929 Other chronic pain: Secondary | ICD-10-CM | POA: Diagnosis not present

## 2020-09-27 DIAGNOSIS — F329 Major depressive disorder, single episode, unspecified: Secondary | ICD-10-CM | POA: Diagnosis not present

## 2020-09-27 DIAGNOSIS — J45909 Unspecified asthma, uncomplicated: Secondary | ICD-10-CM | POA: Diagnosis not present

## 2020-09-27 DIAGNOSIS — G4700 Insomnia, unspecified: Secondary | ICD-10-CM | POA: Diagnosis not present

## 2020-09-27 DIAGNOSIS — J449 Chronic obstructive pulmonary disease, unspecified: Secondary | ICD-10-CM | POA: Diagnosis not present

## 2020-11-10 DIAGNOSIS — F329 Major depressive disorder, single episode, unspecified: Secondary | ICD-10-CM | POA: Diagnosis not present

## 2020-11-10 DIAGNOSIS — G8929 Other chronic pain: Secondary | ICD-10-CM | POA: Diagnosis not present

## 2020-11-10 DIAGNOSIS — J45909 Unspecified asthma, uncomplicated: Secondary | ICD-10-CM | POA: Diagnosis not present

## 2020-11-10 DIAGNOSIS — J449 Chronic obstructive pulmonary disease, unspecified: Secondary | ICD-10-CM | POA: Diagnosis not present

## 2020-11-10 DIAGNOSIS — G4700 Insomnia, unspecified: Secondary | ICD-10-CM | POA: Diagnosis not present

## 2020-11-14 DIAGNOSIS — R739 Hyperglycemia, unspecified: Secondary | ICD-10-CM | POA: Diagnosis not present

## 2020-11-14 DIAGNOSIS — M4807 Spinal stenosis, lumbosacral region: Secondary | ICD-10-CM | POA: Diagnosis not present

## 2020-11-14 DIAGNOSIS — G250 Essential tremor: Secondary | ICD-10-CM | POA: Diagnosis not present

## 2020-11-14 DIAGNOSIS — R61 Generalized hyperhidrosis: Secondary | ICD-10-CM | POA: Diagnosis not present

## 2020-11-14 DIAGNOSIS — F329 Major depressive disorder, single episode, unspecified: Secondary | ICD-10-CM | POA: Diagnosis not present

## 2020-11-14 DIAGNOSIS — J449 Chronic obstructive pulmonary disease, unspecified: Secondary | ICD-10-CM | POA: Diagnosis not present

## 2020-11-14 DIAGNOSIS — G4700 Insomnia, unspecified: Secondary | ICD-10-CM | POA: Diagnosis not present

## 2020-11-14 DIAGNOSIS — N529 Male erectile dysfunction, unspecified: Secondary | ICD-10-CM | POA: Diagnosis not present

## 2020-11-15 DIAGNOSIS — M5416 Radiculopathy, lumbar region: Secondary | ICD-10-CM | POA: Diagnosis not present

## 2020-11-17 DIAGNOSIS — Z0000 Encounter for general adult medical examination without abnormal findings: Secondary | ICD-10-CM | POA: Diagnosis not present

## 2020-11-17 DIAGNOSIS — Z1389 Encounter for screening for other disorder: Secondary | ICD-10-CM | POA: Diagnosis not present

## 2020-11-30 DIAGNOSIS — S2232XA Fracture of one rib, left side, initial encounter for closed fracture: Secondary | ICD-10-CM | POA: Diagnosis not present

## 2020-11-30 DIAGNOSIS — S20212A Contusion of left front wall of thorax, initial encounter: Secondary | ICD-10-CM | POA: Diagnosis not present

## 2020-11-30 DIAGNOSIS — W19XXXA Unspecified fall, initial encounter: Secondary | ICD-10-CM | POA: Diagnosis not present

## 2020-11-30 DIAGNOSIS — R0781 Pleurodynia: Secondary | ICD-10-CM | POA: Diagnosis not present

## 2020-11-30 DIAGNOSIS — S9032XA Contusion of left foot, initial encounter: Secondary | ICD-10-CM | POA: Diagnosis not present

## 2020-12-04 DIAGNOSIS — M48062 Spinal stenosis, lumbar region with neurogenic claudication: Secondary | ICD-10-CM | POA: Diagnosis not present

## 2020-12-04 DIAGNOSIS — M4310 Spondylolisthesis, site unspecified: Secondary | ICD-10-CM | POA: Diagnosis not present

## 2020-12-04 DIAGNOSIS — M5416 Radiculopathy, lumbar region: Secondary | ICD-10-CM | POA: Diagnosis not present

## 2020-12-12 DIAGNOSIS — G4700 Insomnia, unspecified: Secondary | ICD-10-CM | POA: Diagnosis not present

## 2020-12-12 DIAGNOSIS — J45909 Unspecified asthma, uncomplicated: Secondary | ICD-10-CM | POA: Diagnosis not present

## 2020-12-12 DIAGNOSIS — G8929 Other chronic pain: Secondary | ICD-10-CM | POA: Diagnosis not present

## 2020-12-12 DIAGNOSIS — J449 Chronic obstructive pulmonary disease, unspecified: Secondary | ICD-10-CM | POA: Diagnosis not present

## 2020-12-12 DIAGNOSIS — F329 Major depressive disorder, single episode, unspecified: Secondary | ICD-10-CM | POA: Diagnosis not present

## 2020-12-15 DIAGNOSIS — S2232XD Fracture of one rib, left side, subsequent encounter for fracture with routine healing: Secondary | ICD-10-CM | POA: Diagnosis not present

## 2020-12-15 DIAGNOSIS — S9032XA Contusion of left foot, initial encounter: Secondary | ICD-10-CM | POA: Diagnosis not present

## 2020-12-15 DIAGNOSIS — W19XXXD Unspecified fall, subsequent encounter: Secondary | ICD-10-CM | POA: Diagnosis not present

## 2021-01-03 DIAGNOSIS — F329 Major depressive disorder, single episode, unspecified: Secondary | ICD-10-CM | POA: Diagnosis not present

## 2021-01-03 DIAGNOSIS — G8929 Other chronic pain: Secondary | ICD-10-CM | POA: Diagnosis not present

## 2021-01-03 DIAGNOSIS — J449 Chronic obstructive pulmonary disease, unspecified: Secondary | ICD-10-CM | POA: Diagnosis not present

## 2021-01-03 DIAGNOSIS — J45909 Unspecified asthma, uncomplicated: Secondary | ICD-10-CM | POA: Diagnosis not present

## 2021-01-03 DIAGNOSIS — G4700 Insomnia, unspecified: Secondary | ICD-10-CM | POA: Diagnosis not present

## 2021-01-30 DIAGNOSIS — S2232XD Fracture of one rib, left side, subsequent encounter for fracture with routine healing: Secondary | ICD-10-CM | POA: Diagnosis not present

## 2021-01-30 DIAGNOSIS — M4807 Spinal stenosis, lumbosacral region: Secondary | ICD-10-CM | POA: Diagnosis not present

## 2021-01-30 DIAGNOSIS — M5442 Lumbago with sciatica, left side: Secondary | ICD-10-CM | POA: Diagnosis not present

## 2021-02-07 DIAGNOSIS — L821 Other seborrheic keratosis: Secondary | ICD-10-CM | POA: Diagnosis not present

## 2021-02-07 DIAGNOSIS — D692 Other nonthrombocytopenic purpura: Secondary | ICD-10-CM | POA: Diagnosis not present

## 2021-02-12 DIAGNOSIS — Z6827 Body mass index (BMI) 27.0-27.9, adult: Secondary | ICD-10-CM | POA: Diagnosis not present

## 2021-02-12 DIAGNOSIS — R202 Paresthesia of skin: Secondary | ICD-10-CM | POA: Diagnosis not present

## 2021-02-12 DIAGNOSIS — I10 Essential (primary) hypertension: Secondary | ICD-10-CM | POA: Diagnosis not present

## 2021-02-12 DIAGNOSIS — R29898 Other symptoms and signs involving the musculoskeletal system: Secondary | ICD-10-CM | POA: Diagnosis not present

## 2021-02-12 DIAGNOSIS — M5416 Radiculopathy, lumbar region: Secondary | ICD-10-CM | POA: Diagnosis not present

## 2021-02-15 ENCOUNTER — Other Ambulatory Visit: Payer: Self-pay | Admitting: Neurosurgery

## 2021-02-15 DIAGNOSIS — R29898 Other symptoms and signs involving the musculoskeletal system: Secondary | ICD-10-CM

## 2021-02-15 DIAGNOSIS — R202 Paresthesia of skin: Secondary | ICD-10-CM

## 2021-02-22 DIAGNOSIS — F319 Bipolar disorder, unspecified: Secondary | ICD-10-CM | POA: Diagnosis not present

## 2021-03-01 ENCOUNTER — Other Ambulatory Visit: Payer: Medicare HMO

## 2021-03-14 ENCOUNTER — Other Ambulatory Visit: Payer: Self-pay

## 2021-03-14 ENCOUNTER — Ambulatory Visit
Admission: RE | Admit: 2021-03-14 | Discharge: 2021-03-14 | Disposition: A | Discharge status: Home / Self Care | Payer: Medicare HMO | Source: Ambulatory Visit | Attending: Neurosurgery | Admitting: Neurosurgery

## 2021-03-14 ENCOUNTER — Other Ambulatory Visit: Payer: Medicare HMO

## 2021-03-14 DIAGNOSIS — R29898 Other symptoms and signs involving the musculoskeletal system: Secondary | ICD-10-CM

## 2021-03-14 DIAGNOSIS — M4802 Spinal stenosis, cervical region: Secondary | ICD-10-CM | POA: Diagnosis not present

## 2021-03-14 DIAGNOSIS — M5124 Other intervertebral disc displacement, thoracic region: Secondary | ICD-10-CM | POA: Diagnosis not present

## 2021-03-14 DIAGNOSIS — S32020A Wedge compression fracture of second lumbar vertebra, initial encounter for closed fracture: Secondary | ICD-10-CM | POA: Diagnosis not present

## 2021-03-14 DIAGNOSIS — R202 Paresthesia of skin: Secondary | ICD-10-CM

## 2021-03-14 DIAGNOSIS — R531 Weakness: Secondary | ICD-10-CM | POA: Diagnosis not present

## 2021-03-14 DIAGNOSIS — M4319 Spondylolisthesis, multiple sites in spine: Secondary | ICD-10-CM | POA: Diagnosis not present

## 2021-03-14 DIAGNOSIS — M47817 Spondylosis without myelopathy or radiculopathy, lumbosacral region: Secondary | ICD-10-CM | POA: Diagnosis not present

## 2021-03-14 DIAGNOSIS — M47816 Spondylosis without myelopathy or radiculopathy, lumbar region: Secondary | ICD-10-CM | POA: Diagnosis not present

## 2021-03-14 MED ORDER — GADOBENATE DIMEGLUMINE 529 MG/ML IV SOLN
18.0000 mL | Freq: Once | INTRAVENOUS | Status: AC | PRN
  Administered 2021-03-14: 18 mL via INTRAVENOUS

## 2021-03-15 DIAGNOSIS — R29898 Other symptoms and signs involving the musculoskeletal system: Secondary | ICD-10-CM | POA: Diagnosis not present

## 2021-03-15 DIAGNOSIS — I10 Essential (primary) hypertension: Secondary | ICD-10-CM | POA: Diagnosis not present

## 2021-03-15 DIAGNOSIS — Z6827 Body mass index (BMI) 27.0-27.9, adult: Secondary | ICD-10-CM | POA: Diagnosis not present

## 2021-03-22 ENCOUNTER — Telehealth: Payer: Self-pay | Admitting: Neurology

## 2021-03-22 NOTE — Telephone Encounter (Signed)
Pt called in stating he has had 2 spinal surgeries and an MRI. He would like for Dr. Posey Pronto to review that MRI done on 03/14/21.

## 2021-03-22 NOTE — Telephone Encounter (Signed)
I would need to get up to date with his medical history with updated exam, so he would need to schedule a follow-up visit (routine) to discuss any questions he may have related to his MRI.  Imaging was ordered by Dr. Vertell Limber and would recommend that he first discuss there results with his surgeon.

## 2021-03-23 ENCOUNTER — Other Ambulatory Visit: Payer: Self-pay | Admitting: Neurology

## 2021-03-23 NOTE — Telephone Encounter (Signed)
04/20/21 appointment with Dr. Patient and added to wait list by request.

## 2021-03-23 NOTE — Telephone Encounter (Signed)
Called patient and left a message for a call back.  

## 2021-03-27 DIAGNOSIS — Z5181 Encounter for therapeutic drug level monitoring: Secondary | ICD-10-CM | POA: Diagnosis not present

## 2021-03-27 DIAGNOSIS — I700 Atherosclerosis of aorta: Secondary | ICD-10-CM | POA: Diagnosis not present

## 2021-03-27 DIAGNOSIS — G319 Degenerative disease of nervous system, unspecified: Secondary | ICD-10-CM | POA: Diagnosis not present

## 2021-03-27 DIAGNOSIS — M5442 Lumbago with sciatica, left side: Secondary | ICD-10-CM | POA: Diagnosis not present

## 2021-03-27 DIAGNOSIS — R29898 Other symptoms and signs involving the musculoskeletal system: Secondary | ICD-10-CM | POA: Diagnosis not present

## 2021-03-27 DIAGNOSIS — D692 Other nonthrombocytopenic purpura: Secondary | ICD-10-CM | POA: Diagnosis not present

## 2021-03-27 DIAGNOSIS — R030 Elevated blood-pressure reading, without diagnosis of hypertension: Secondary | ICD-10-CM | POA: Diagnosis not present

## 2021-04-02 ENCOUNTER — Other Ambulatory Visit: Payer: Self-pay

## 2021-04-02 ENCOUNTER — Encounter: Payer: Self-pay | Admitting: Neurology

## 2021-04-02 ENCOUNTER — Ambulatory Visit (INDEPENDENT_AMBULATORY_CARE_PROVIDER_SITE_OTHER): Payer: Medicare HMO | Admitting: Neurology

## 2021-04-02 VITALS — BP 129/69 | HR 73 | Resp 20 | Ht 70.0 in | Wt 196.0 lb

## 2021-04-02 DIAGNOSIS — G250 Essential tremor: Secondary | ICD-10-CM | POA: Diagnosis not present

## 2021-04-02 DIAGNOSIS — R531 Weakness: Secondary | ICD-10-CM

## 2021-04-02 DIAGNOSIS — M545 Low back pain, unspecified: Secondary | ICD-10-CM | POA: Diagnosis not present

## 2021-04-02 DIAGNOSIS — G8929 Other chronic pain: Secondary | ICD-10-CM

## 2021-04-02 DIAGNOSIS — D692 Other nonthrombocytopenic purpura: Secondary | ICD-10-CM | POA: Diagnosis not present

## 2021-04-02 MED ORDER — PRIMIDONE 50 MG PO TABS: ORAL_TABLET | ORAL | 3 refills | Status: DC

## 2021-04-02 MED ORDER — PROPRANOLOL HCL ER 120 MG PO CP24: 120.0000 mg | ORAL_CAPSULE | Freq: Two times a day (BID) | ORAL | 3 refills | Status: DC

## 2021-04-02 NOTE — Progress Notes (Signed)
Follow-up Visit   Date: 04/02/21    Gabriel Richardson MRN: [id number] DOB: [birth year]   Interim History: Gabriel Richardson is a 75 y.o. right-handed Caucasian male with history of depression, asthma, and hypertension, who is followed here for essential tremor. He presents today to review MRI images of his spine. The patient was accompanied to the clinic by self.  History of present illness: Initial visit 04/30/2013:  He had previously been a patient of Dr. [name] since 1995, initially for right trigeminal neuralgia which he had frequent flares of since 1997 - early 2000. He has tried trileptal, neurontin, and lyrica and had the most relief with Lyrica.  He has not had a flare since 2000, until 2016.    Around 2012, he started developing muscle cramps and left hand tremors and was concerned about multiple sclerosis. Although there was white matter changes on his MRI, Dr. [name] did not find that his exam and imaging was consistent with demyelinating disease. He also had CSF testing in January 2014 which did not show any inflammatory changes. He treated him symptomatically and was started on propranolol 60mg  which has helped with his tremors.  He takes robaxin 500mg  twice daily as needed which also resolves his muscle cramps.  He is s/p anterior cervical fusion by Dr. [name] in 2010 and has history of left L5 radiculopathy status post L5-S1 laminectomy.  He saw Dr. [name] in July 2016 for new left ptosis.  Myasthenia antibodies were checked and was normal.  He declined to have additional testing done to evaluate this.  Late 2016, he began experiencing right facial shooting paresthesias over the upper and lower lip especially with light tactile stimuli, chewing, brushing, and talking. He has exquisite sensitivity over the right upper lip.  He denies painful paresthesias over the forehead and cheek.  His hand tremors are well-controlled on inderal 60mg  and he has noticed that it is improved with  alcohol.  He has chronic low back pain and sees Dr. [name] for this and have been contemplating surgery.  He takes tramadol as needed.  He saw Dr. [name] for low back pain follow-up, but also discussed his trigeminal neuralgia who states that he is able to microvascular decompression surgery for this, if medications are ineffective.  He currently takes Lyrica 100mg  twice daily, because he is unable to tolerate extra dose in the afternoon (blurry vision, sedation).   Starting in 2018, his hand tremors started getting worse.  His propranolol was steadily increased to 220/d without benefit, so primidone was added and titrated to 150mg /d.  UPDATE 04/02/2021:  Patient requested appointment to review his MRI spine which was ordered by neurosurgeon as part of work-up for ongoing weakness in the arms and legs.  He underwent thoracic T2-T3 laminectomy with resection of synovial cyst (March 2022) by Dr. Vertell Limber. He reports easy fatigability in the arms and legs. He uses a cane for assistance. No falls.  He complain of low back pain.  He has received ESI in the past which provided temporarily relief. He did not perform PT for arm or leg strengthening.  MRI cervical spine shows ACDF at C4-5 and C5-6 and mild canal stenosis at C7-T1 with mild-mod biforaminal stenosis. Diffuse marrow changes were seen.   MRI thoracic and lumbar spine shows resolution of T2-3 canal stenosis s/p laminectomy, L2 compression fracture,L5-S1 left subarticular recess and foraminal stenosis (severe).  These results were reviewed with pt by Dr. Reatha Armour (for Dr. Vertell Limber) who who did not  see any role for surgery.   Medications:  Current Outpatient Medications on File Prior to Visit  Medication Sig Dispense Refill   acetaminophen (TYLENOL) 500 MG tablet Take 1,000 mg by mouth every 6 (six) hours as needed for mild pain or headache.     Ketorolac Tromethamine (TORADOL ORAL PO) Take 60 mg by mouth. Takes prn     lamoTRIgine (LAMICTAL) 200 MG  tablet Take 200 mg by mouth at bedtime.  1   oxyCODONE-acetaminophen (PERCOCET) 5-325 MG tablet Take 1 tablet by mouth every 4 (four) hours as needed for severe pain. 42 tablet 0   primidone (MYSOLINE) 50 MG tablet TAKE TWO TABLETS BY MOUTH TWICE A DAY 120 tablet 0   propranolol ER (INDERAL LA) 120 MG 24 hr capsule Take 1 capsule (120 mg total) by mouth 2 (two) times daily. OK to take extra tablet as needed for worsening tremor. 200 capsule 3   SHINGRIX injection      sildenafil (VIAGRA) 100 MG tablet Take 1 tablet (100 mg total) by mouth daily as needed for erectile dysfunction. 12 tablet 11   zolpidem (AMBIEN) 10 MG tablet Take 10 mg by mouth at bedtime.  1   No current facility-administered medications on file prior to visit.    Allergies: No Known Allergies  Vital Signs:  BP 129/69   Pulse 73   Resp 20   Ht 5\' 10"  (1.778 m)   Wt 196 lb (88.9 kg)   SpO2 95%   BMI 28.12 kg/m    Neurological Exam: MENTAL STATUS including orientation to time, place, person, recent and remote memory, attention span and concentration, language, and fund of knowledge is normal.  Speech is not dysarthric.  CRANIAL NERVES: No visual field defects.  Pupils equal round and reactive to light.  Normal conjugate, extra-ocular eye movements in all directions of gaze.  Mild left ptosis (old).  Face is symmetric. Palate elevates symmetrically.  Tongue is midline.  MOTOR:  Motor strength is 5/5 in all extremities. Mild postural tremor on the right hand.  No atrophy, fasciculations or abnormal movements.  No pronator drift.  Tone is normal.    MSRs:  Reflexes are 2+/4 throughout, except reduced at the left ankle.  SENSORY:  Intact to vibration throughout.  COORDINATION/GAIT:  Normal finger-to- nose-finger..  Intact rapid alternating movements bilaterally.  Gait mildly wide-based and stable, assited with cane  Data: AChR blocking and binding 11/02/2012 - neg  CSF 06/02/2012:  R0  W1  G63  P43  IgG index 0.43    OCB -neg  MRI brain wwo contrast 07/08/2009: 1. Advanced white matter disease in a pattern compatible with multiple sclerosis. Mild progression since 2004. No areas of   enhancement.   2. No acute intracranial abnormality.  MRI thoracic and lumbar spine 03/16/2021: 1. Postsurgical changes from laminectomy at the T2-3 level with interval resolution of spinal canal stenosis. Improvement of the neural foraminal narrowing at this level, now mild bilaterally. 2. Normal appearance of the thoracic spinal cord on T2 weighted images with heterogeneous signal on post contrast images in the cord from the C6 through the T2 level which is favored to represent artifact rather than contrast enhancement. However, pathological contrast enhancement cannot be excluded. In case of high clinical concern for cord lesion at this level, repeat images are recommended. 3. Acute/subacute compression fracture of the superior endplate of L2 with approximately 30% loss of vertebral body height without retropulsion. 4. Degenerative changes and anterolisthesis at L5-S1 resulting in  severe narrowing of the left subarticular zone and severe left neural foraminal narrowing. 5. 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  MRI cervical spine wo contrast  03/16/2021: 1. Interval ACDF of C3-4 with slight retrolisthesis of C3 on C4. Mild canal stenosis at this level has slightly improved from prior. 2. Prior ACDF at C4-5 and C5-6 with solid arthrodesis. No residual canal stenosis. 3. Stable degenerative changes of the cervical spine including borderline-mild canal stenosis at C7-T1 with moderate left and mild right foraminal stenosis. 4. Diffuse marrow heterogeneity without discrete marrow replacing lesion. Findings are nonspecific but can be seen in the setting of chronic anemia, smoking, and/or obesity.     IMPRESSION/PLAN: 1.   Generalized weakness of the arms and legs (subjective). No focal weakness on exam, no exam findings to suggest ongoing myelopathy. I reviewed his MRI spine and do not see any compressive pathology to explain his symptoms.  I further agree with Dr. Reatha Armour who has informed pt that there is no role for surgery.  I have encouraged him to start PT for core strengthening, he would like to discuss this with his PCP.  2. Benign essential tremor, stable  - Continue primidone 100mg  BID  - Continue propranolol 120mg   BID.  OK to take extra dose as needed  2.  Right trigeminal neuralgia, asymptomatic  - Previously tried:  Gabapentin, tegretol, Lyrica  - He takes lamictal 200mg  at bedtime as a mood stabilizer  Return to clinic in 1 year  Total time spent reviewing records, interview, history/exam, documentation, and coordination of care on day of encounter:  30 min   Thank you for allowing me to participate in patient's care.  If I can answer any additional questions, I would be pleased to do so.    Sincerely,    Douglass Dunshee K. Posey Pronto, DO

## 2021-04-02 NOTE — Patient Instructions (Signed)
Return to clinic in 1 year.

## 2021-04-09 DIAGNOSIS — S299XXA Unspecified injury of thorax, initial encounter: Secondary | ICD-10-CM | POA: Diagnosis not present

## 2021-04-09 DIAGNOSIS — W19XXXA Unspecified fall, initial encounter: Secondary | ICD-10-CM | POA: Diagnosis not present

## 2021-04-09 DIAGNOSIS — R0781 Pleurodynia: Secondary | ICD-10-CM | POA: Diagnosis not present

## 2021-04-09 DIAGNOSIS — S300XXA Contusion of lower back and pelvis, initial encounter: Secondary | ICD-10-CM | POA: Diagnosis not present

## 2021-04-17 DIAGNOSIS — S300XXA Contusion of lower back and pelvis, initial encounter: Secondary | ICD-10-CM | POA: Diagnosis not present

## 2021-04-17 DIAGNOSIS — R29898 Other symptoms and signs involving the musculoskeletal system: Secondary | ICD-10-CM | POA: Diagnosis not present

## 2021-04-17 DIAGNOSIS — R030 Elevated blood-pressure reading, without diagnosis of hypertension: Secondary | ICD-10-CM | POA: Diagnosis not present

## 2021-04-17 DIAGNOSIS — M5442 Lumbago with sciatica, left side: Secondary | ICD-10-CM | POA: Diagnosis not present

## 2021-04-17 DIAGNOSIS — D692 Other nonthrombocytopenic purpura: Secondary | ICD-10-CM | POA: Diagnosis not present

## 2021-04-17 DIAGNOSIS — G319 Degenerative disease of nervous system, unspecified: Secondary | ICD-10-CM | POA: Diagnosis not present

## 2021-04-17 DIAGNOSIS — Z5181 Encounter for therapeutic drug level monitoring: Secondary | ICD-10-CM | POA: Diagnosis not present

## 2021-04-17 DIAGNOSIS — I700 Atherosclerosis of aorta: Secondary | ICD-10-CM | POA: Diagnosis not present

## 2021-04-17 DIAGNOSIS — S299XXA Unspecified injury of thorax, initial encounter: Secondary | ICD-10-CM | POA: Diagnosis not present

## 2021-04-20 ENCOUNTER — Ambulatory Visit: Payer: Medicare HMO | Admitting: Neurology

## 2021-05-03 DIAGNOSIS — F329 Major depressive disorder, single episode, unspecified: Secondary | ICD-10-CM | POA: Diagnosis not present

## 2021-05-03 DIAGNOSIS — G4700 Insomnia, unspecified: Secondary | ICD-10-CM | POA: Diagnosis not present

## 2021-05-03 DIAGNOSIS — G8929 Other chronic pain: Secondary | ICD-10-CM | POA: Diagnosis not present

## 2021-05-13 DIAGNOSIS — Z87891 Personal history of nicotine dependence: Secondary | ICD-10-CM | POA: Diagnosis not present

## 2021-05-13 DIAGNOSIS — W06XXXA Fall from bed, initial encounter: Secondary | ICD-10-CM | POA: Diagnosis not present

## 2021-05-13 DIAGNOSIS — J45909 Unspecified asthma, uncomplicated: Secondary | ICD-10-CM | POA: Diagnosis not present

## 2021-05-13 DIAGNOSIS — S0181XA Laceration without foreign body of other part of head, initial encounter: Secondary | ICD-10-CM | POA: Diagnosis not present

## 2021-05-13 DIAGNOSIS — Z23 Encounter for immunization: Secondary | ICD-10-CM | POA: Insufficient documentation

## 2021-05-13 DIAGNOSIS — I10 Essential (primary) hypertension: Secondary | ICD-10-CM | POA: Insufficient documentation

## 2021-05-13 DIAGNOSIS — J449 Chronic obstructive pulmonary disease, unspecified: Secondary | ICD-10-CM | POA: Diagnosis not present

## 2021-05-13 DIAGNOSIS — S0993XA Unspecified injury of face, initial encounter: Secondary | ICD-10-CM | POA: Diagnosis present

## 2021-05-13 NOTE — ED Triage Notes (Signed)
Pt presents with a laceration to his chin. Pt states, "I had a couple of drinks too many and fell on the carpet"

## 2021-05-14 ENCOUNTER — Emergency Department (HOSPITAL_BASED_OUTPATIENT_CLINIC_OR_DEPARTMENT_OTHER): Payer: Medicare HMO

## 2021-05-14 ENCOUNTER — Emergency Department (HOSPITAL_BASED_OUTPATIENT_CLINIC_OR_DEPARTMENT_OTHER)
Admission: EM | Admit: 2021-05-14 | Discharge: 2021-05-14 | Disposition: A | Discharge status: Home / Self Care | Payer: Medicare HMO | Attending: Emergency Medicine | Admitting: Emergency Medicine

## 2021-05-14 ENCOUNTER — Encounter (HOSPITAL_BASED_OUTPATIENT_CLINIC_OR_DEPARTMENT_OTHER): Payer: Self-pay

## 2021-05-14 DIAGNOSIS — S0181XA Laceration without foreign body of other part of head, initial encounter: Secondary | ICD-10-CM

## 2021-05-14 MED ORDER — TETANUS-DIPHTH-ACELL PERTUSSIS 5-2.5-18.5 LF-MCG/0.5 IM SUSY
0.5000 mL | PREFILLED_SYRINGE | Freq: Once | INTRAMUSCULAR | Status: AC
  Administered 2021-05-14: 02:00:00 0.5 mL via INTRAMUSCULAR
  Filled 2021-05-14: qty 0.5

## 2021-05-14 MED ORDER — LIDOCAINE-EPINEPHRINE (PF) 2 %-1:200000 IJ SOLN
20.0000 mL | Freq: Once | INTRAMUSCULAR | Status: AC
  Administered 2021-05-14: 20 mL
  Filled 2021-05-14: qty 20

## 2021-05-14 MED ORDER — TETANUS-DIPHTH-ACELL PERTUSSIS 5-2.5-18.5 LF-MCG/0.5 IM SUSY
PREFILLED_SYRINGE | INTRAMUSCULAR | Status: AC
  Filled 2021-05-14: qty 0.5

## 2021-05-14 NOTE — Discharge Instructions (Signed)
You presented with a laceration to the chin.  This was both sutured and glued.  Glue will fall out on its own.  Apply antibiotic ointment.  You do not need suture removal.

## 2021-05-14 NOTE — ED Provider Notes (Signed)
[facility] EMERGENCY DEPT Provider Note   CSN: [id number] Arrival date & time: 05/13/21  2352     History Chief Complaint  Patient presents with   Fall   Laceration    NUMAIR MASDEN is a 75 y.o. male.  HPI     This is a 75 year old male with a history of COPD, bipolar disorder who presents following a fall.  Patient reports that he had some alcoholic beverages this evening.  He tripped and fell out of the bed.  He hit his chin.  He did not lose consciousness.  He is not on any blood thinners.  He reports injury to the chin.  Denies chest pain, shortness of breath, hip pain, back pain.  No neck pain.  Unknown last tetanus.  Past Medical History:  Diagnosis Date   Ankle fracture    right with syndesmotic disruption   Anxiety    Arthritis    Asthma    Bipolar disorder ([place])    Cervical stenosis of spinal canal    Closed fracture of right distal fibula 01/07/2019   COPD (chronic obstructive pulmonary disease) ([place])    Depression    Emphysema of lung ([place])    Neuromuscular disorder ([place])    Trigeminal neuralgia    Wears glasses     Patient Active Problem List   Diagnosis Date Noted   Thoracic myelopathy 08/08/2020   Closed fracture of right distal fibula 01/07/2019   Right trigeminal neuralgia 07/31/2015   Benign essential tremor 07/31/2015   Ptosis 12/14/2012   Tremor 12/14/2012   Depression 11/02/2012   Asthma, chronic 11/02/2012   HYPERTENSION, BENIGN 01/15/2009   ABNORMAL ELECTROCARDIOGRAM 01/12/2009    Past Surgical History:  Procedure Laterality Date   CERVICAL FUSION     COLONOSCOPY  10 years ago    At [place] exam"   LUMBAR LAMINECTOMY     ORIF ANKLE FRACTURE Right 01/07/2019   Procedure: Right ankle open reduction internal fixation with syndesmosis fixation;  Surgeon: Nicholes Stairs, MD;  Location: [facility];  Service: Orthopedics;  Laterality: Right;  90 mins   SPINE SURGERY     Cervical C3-C5 anterior fusion    THORACIC DISCECTOMY N/A 08/08/2020   Procedure: Thoracic two- thoracic three Laminectomy;  Surgeon: Erline Levine, MD;  Location: [facility];  Service: Neurosurgery;  Laterality: N/A;       Family History  Problem Relation Age of Onset   Kidney disease Mother    Breast cancer Mother        Living, 88   Diabetes Mellitus II Father        Deceased, 74   Tremor Father    Depression Daughter    Healthy Brother    Colon cancer Neg Hx     Social History   Tobacco Use   Smoking status: Former    Packs/day: 1.50    Years: 25.00    Pack years: 37.50    Types: Cigarettes    Quit date: 05/20/1997    Years since quitting: 24.0   Smokeless tobacco: Never  Vaping Use   Vaping Use: Never used  Substance Use Topics   Alcohol use: Yes    Alcohol/week: 2.0 standard drinks    Types: 2 Shots of liquor per week    Comment: 14 drinks per week per pt   Drug use: No    Home Medications Prior to Admission medications   Medication Sig Start Date End Date Taking? Authorizing Provider  acetaminophen (TYLENOL)  500 MG tablet Take 1,000 mg by mouth every 6 (six) hours as needed for mild pain or headache.    [provider]  Ketorolac Tromethamine (TORADOL ORAL PO) Take 60 mg by mouth. Takes prn    [provider]  lamoTRIgine (LAMICTAL) 200 MG tablet Take 200 mg by mouth at bedtime. 01/18/15   [provider]  oxyCODONE-acetaminophen (PERCOCET) 5-325 MG tablet Take 1 tablet by mouth every 4 (four) hours as needed for severe pain. 08/09/20 08/09/21  Marvis Moeller, NP  primidone (MYSOLINE) 50 MG tablet TAKE TWO TABLETS BY MOUTH TWICE A DAY 04/02/21   Patel, Donika K, DO  propranolol ER (INDERAL LA) 120 MG 24 hr capsule Take 1 capsule (120 mg total) by mouth 2 (two) times daily. OK to take extra tablet as needed for worsening tremor. 04/02/21   Alda Berthold, DO  SHINGRIX injection  11/21/20   [provider]  sildenafil (VIAGRA) 100 MG tablet Take 1 tablet (100 mg  total) by mouth daily as needed for erectile dysfunction. 03/08/15   Darlyne Russian, MD  zolpidem (AMBIEN) 10 MG tablet Take 10 mg by mouth at bedtime. 07/19/15   [provider]    Allergies    Patient has no known allergies.  Review of Systems   Review of Systems  Constitutional:  Negative for fever.  Respiratory:  Negative for shortness of breath.   Cardiovascular:  Negative for chest pain.  Skin:  Positive for wound.  Neurological:  Negative for headaches.  All other systems reviewed and are negative.  Physical Exam Updated Vital Signs BP 132/77 (BP Location: Right Arm)    Pulse 69    Temp 97.8 F (36.6 C) (Oral)    Resp 18    SpO2 98%   Physical Exam Vitals and nursing note reviewed.  Constitutional:      Appearance: He is well-developed. He is not ill-appearing.     Comments: ABCs intact  HENT:     Head: Normocephalic.     Comments: 8 cm vertical laceration over the mid chin extending just inferior to the lip to under the mandible, slightly gaping, midface stable    Mouth/Throat:     Mouth: Mucous membranes are moist.  Eyes:     Pupils: Pupils are equal, round, and reactive to light.  Cardiovascular:     Rate and Rhythm: Normal rate and regular rhythm.     Heart sounds: Normal heart sounds. No murmur heard. Pulmonary:     Effort: Pulmonary effort is normal. No respiratory distress.     Breath sounds: Normal breath sounds. No wheezing.  Abdominal:     Palpations: Abdomen is soft.     Tenderness: There is no abdominal tenderness. There is no rebound.  Musculoskeletal:     Cervical back: Neck supple.  Lymphadenopathy:     Cervical: No cervical adenopathy.  Skin:    General: Skin is warm and dry.  Neurological:     Mental Status: He is alert and oriented to person, place, and time.  Psychiatric:        Mood and Affect: Mood normal.    ED Results / Procedures / Treatments   Labs (all labs ordered are listed, but only abnormal results are  displayed) Labs Reviewed - No data to display  EKG None  Radiology No results found.  Procedures .Marland KitchenLaceration Repair  Date/Time: 05/14/2021 2:22 AM Performed by: Merryl Hacker, MD Authorized by: Merryl Hacker, MD   Consent:  Consent obtained:  Verbal   Consent given by:  Patient   Risks, benefits, and alternatives were discussed: yes     Risks discussed:  Pain, poor cosmetic result, poor wound healing and need for additional repair Universal protocol:    Patient identity confirmed:  Verbally with patient Anesthesia:    Anesthesia method:  Local infiltration   Local anesthetic:  Lidocaine 2% WITH epi Laceration details:    Location:  Face   Face location:  Chin   Length (cm):  8   Depth (mm):  8 Exploration:    Limited defect created (wound extended): no     Hemostasis achieved with:  Direct pressure   Wound exploration: wound explored through full range of motion     Wound extent: no areolar tissue violation noted, no fascia violation noted, no foreign bodies/material noted, no muscle damage noted, no nerve damage noted, no tendon damage noted, no underlying fracture noted and no vascular damage noted     Contaminated: no   Treatment:    Area cleansed with:  Chlorhexidine and povidone-iodine   Amount of cleaning:  Standard   Irrigation solution:  Sterile saline   Irrigation method:  Pressure wash   Visualized foreign bodies/material removed: no     Debridement:  None   Undermining:  None   Scar revision: no     Layers/structures repaired:  Deep subcutaneous Deep subcutaneous:    Suture size:  4-0   Suture material:  Vicryl   Suture technique:  Simple interrupted   Number of sutures:  4 Skin repair:    Repair method:  Steri-Strips and tissue adhesive   Number of Steri-Strips:  7 Approximation:    Approximation:  Close Repair type:    Repair type:  Intermediate Post-procedure details:    Dressing:  Open (no dressing)   Procedure completion:   Tolerated   Medications Ordered in ED Medications  lidocaine-EPINEPHrine (XYLOCAINE W/EPI) 2 %-1:200000 (PF) injection 20 mL (20 mLs Infiltration Given by Other 05/14/21 5176)    ED Course  I have reviewed the triage vital signs and the nursing notes.  Pertinent labs & imaging results that were available during my care of the patient were reviewed by me and considered in my medical decision making (see chart for details).    MDM Rules/Calculators/A&P                          Patient presents following a fall.  He is nontoxic and vital signs are reassuring.  ABCs intact.  Reports alcohol use tonight which he attributes to the fall.  He is able to provide history.  He is awake, alert, oriented.  Clinically not intoxicated.  I ordered a CT scan given his age and increased risk for intracranial injury.  Patient declined CT imaging.  He stated understanding risk and benefits including but not limited to, missed intracranial bleed which could result in debilitation or death.  Laceration was repaired at bedside.  He was given wound care instructions.  After history, exam, and medical workup I feel the patient has been appropriately medically screened and is safe for discharge home. Pertinent diagnoses were discussed with the patient. Patient was given return precautions.      Final Clinical Impression(s) / ED Diagnoses Final diagnoses:  Chin laceration, initial encounter    Rx / DC Orders ED Discharge Orders     None        Jaece Ducharme, Barbette Hair, MD  05/14/21 0225 ° °

## 2021-05-21 DIAGNOSIS — S299XXA Unspecified injury of thorax, initial encounter: Secondary | ICD-10-CM | POA: Diagnosis not present

## 2021-06-05 DIAGNOSIS — H524 Presbyopia: Secondary | ICD-10-CM | POA: Diagnosis not present

## 2021-06-05 DIAGNOSIS — H35033 Hypertensive retinopathy, bilateral: Secondary | ICD-10-CM | POA: Diagnosis not present

## 2021-06-14 DIAGNOSIS — R29898 Other symptoms and signs involving the musculoskeletal system: Secondary | ICD-10-CM | POA: Diagnosis not present

## 2021-06-14 DIAGNOSIS — I700 Atherosclerosis of aorta: Secondary | ICD-10-CM | POA: Diagnosis not present

## 2021-06-14 DIAGNOSIS — Z5181 Encounter for therapeutic drug level monitoring: Secondary | ICD-10-CM | POA: Diagnosis not present

## 2021-06-14 DIAGNOSIS — R296 Repeated falls: Secondary | ICD-10-CM | POA: Diagnosis not present

## 2021-06-14 DIAGNOSIS — S299XXA Unspecified injury of thorax, initial encounter: Secondary | ICD-10-CM | POA: Diagnosis not present

## 2021-06-14 DIAGNOSIS — G319 Degenerative disease of nervous system, unspecified: Secondary | ICD-10-CM | POA: Diagnosis not present

## 2021-06-14 DIAGNOSIS — E559 Vitamin D deficiency, unspecified: Secondary | ICD-10-CM | POA: Diagnosis not present

## 2021-06-14 DIAGNOSIS — D692 Other nonthrombocytopenic purpura: Secondary | ICD-10-CM | POA: Diagnosis not present

## 2021-06-14 DIAGNOSIS — R030 Elevated blood-pressure reading, without diagnosis of hypertension: Secondary | ICD-10-CM | POA: Diagnosis not present

## 2021-07-19 DIAGNOSIS — E559 Vitamin D deficiency, unspecified: Secondary | ICD-10-CM | POA: Diagnosis not present

## 2021-07-19 DIAGNOSIS — R030 Elevated blood-pressure reading, without diagnosis of hypertension: Secondary | ICD-10-CM | POA: Diagnosis not present

## 2021-07-19 DIAGNOSIS — D692 Other nonthrombocytopenic purpura: Secondary | ICD-10-CM | POA: Diagnosis not present

## 2021-07-19 DIAGNOSIS — G8929 Other chronic pain: Secondary | ICD-10-CM | POA: Diagnosis not present

## 2021-07-19 DIAGNOSIS — G319 Degenerative disease of nervous system, unspecified: Secondary | ICD-10-CM | POA: Diagnosis not present

## 2021-07-19 DIAGNOSIS — R29898 Other symptoms and signs involving the musculoskeletal system: Secondary | ICD-10-CM | POA: Diagnosis not present

## 2021-07-19 DIAGNOSIS — Z5181 Encounter for therapeutic drug level monitoring: Secondary | ICD-10-CM | POA: Diagnosis not present

## 2021-07-19 DIAGNOSIS — M545 Low back pain, unspecified: Secondary | ICD-10-CM | POA: Diagnosis not present

## 2021-07-19 DIAGNOSIS — I700 Atherosclerosis of aorta: Secondary | ICD-10-CM | POA: Diagnosis not present

## 2021-07-23 DIAGNOSIS — F319 Bipolar disorder, unspecified: Secondary | ICD-10-CM | POA: Diagnosis not present

## 2021-08-16 DIAGNOSIS — L03114 Cellulitis of left upper limb: Secondary | ICD-10-CM | POA: Diagnosis not present

## 2021-08-16 DIAGNOSIS — H1032 Unspecified acute conjunctivitis, left eye: Secondary | ICD-10-CM | POA: Diagnosis not present

## 2021-08-23 DIAGNOSIS — L03114 Cellulitis of left upper limb: Secondary | ICD-10-CM | POA: Diagnosis not present

## 2021-08-23 DIAGNOSIS — H04202 Unspecified epiphora, left lacrimal gland: Secondary | ICD-10-CM | POA: Diagnosis not present

## 2021-08-27 DIAGNOSIS — H1045 Other chronic allergic conjunctivitis: Secondary | ICD-10-CM | POA: Diagnosis not present

## 2021-09-04 DIAGNOSIS — G4700 Insomnia, unspecified: Secondary | ICD-10-CM | POA: Diagnosis not present

## 2021-09-04 DIAGNOSIS — J449 Chronic obstructive pulmonary disease, unspecified: Secondary | ICD-10-CM | POA: Diagnosis not present

## 2021-09-04 DIAGNOSIS — G8929 Other chronic pain: Secondary | ICD-10-CM | POA: Diagnosis not present

## 2021-09-04 DIAGNOSIS — F329 Major depressive disorder, single episode, unspecified: Secondary | ICD-10-CM | POA: Diagnosis not present

## 2021-09-11 DIAGNOSIS — M47816 Spondylosis without myelopathy or radiculopathy, lumbar region: Secondary | ICD-10-CM | POA: Diagnosis not present

## 2021-09-11 DIAGNOSIS — Z9889 Other specified postprocedural states: Secondary | ICD-10-CM | POA: Diagnosis not present

## 2021-09-11 DIAGNOSIS — G894 Chronic pain syndrome: Secondary | ICD-10-CM | POA: Diagnosis not present

## 2021-09-17 DIAGNOSIS — G894 Chronic pain syndrome: Secondary | ICD-10-CM | POA: Diagnosis not present

## 2021-09-17 DIAGNOSIS — M47816 Spondylosis without myelopathy or radiculopathy, lumbar region: Secondary | ICD-10-CM | POA: Diagnosis not present

## 2021-09-17 DIAGNOSIS — F1120 Opioid dependence, uncomplicated: Secondary | ICD-10-CM | POA: Diagnosis not present

## 2021-09-17 DIAGNOSIS — Z9889 Other specified postprocedural states: Secondary | ICD-10-CM | POA: Diagnosis not present

## 2021-10-24 DIAGNOSIS — M5459 Other low back pain: Secondary | ICD-10-CM | POA: Diagnosis not present

## 2021-10-24 DIAGNOSIS — M47816 Spondylosis without myelopathy or radiculopathy, lumbar region: Secondary | ICD-10-CM | POA: Diagnosis not present

## 2021-12-06 DIAGNOSIS — G8929 Other chronic pain: Secondary | ICD-10-CM | POA: Diagnosis not present

## 2021-12-06 DIAGNOSIS — G4700 Insomnia, unspecified: Secondary | ICD-10-CM | POA: Diagnosis not present

## 2021-12-06 DIAGNOSIS — F329 Major depressive disorder, single episode, unspecified: Secondary | ICD-10-CM | POA: Diagnosis not present

## 2021-12-12 DIAGNOSIS — M47816 Spondylosis without myelopathy or radiculopathy, lumbar region: Secondary | ICD-10-CM | POA: Diagnosis not present

## 2021-12-12 DIAGNOSIS — M5459 Other low back pain: Secondary | ICD-10-CM | POA: Diagnosis not present

## 2021-12-26 DIAGNOSIS — M5459 Other low back pain: Secondary | ICD-10-CM | POA: Diagnosis not present

## 2021-12-26 DIAGNOSIS — M47816 Spondylosis without myelopathy or radiculopathy, lumbar region: Secondary | ICD-10-CM | POA: Diagnosis not present

## 2022-01-18 DIAGNOSIS — G250 Essential tremor: Secondary | ICD-10-CM | POA: Diagnosis not present

## 2022-01-18 DIAGNOSIS — D692 Other nonthrombocytopenic purpura: Secondary | ICD-10-CM | POA: Diagnosis not present

## 2022-01-18 DIAGNOSIS — F3170 Bipolar disorder, currently in remission, most recent episode unspecified: Secondary | ICD-10-CM | POA: Diagnosis not present

## 2022-01-18 DIAGNOSIS — R7989 Other specified abnormal findings of blood chemistry: Secondary | ICD-10-CM | POA: Diagnosis not present

## 2022-01-18 DIAGNOSIS — M4807 Spinal stenosis, lumbosacral region: Secondary | ICD-10-CM | POA: Diagnosis not present

## 2022-01-18 DIAGNOSIS — R296 Repeated falls: Secondary | ICD-10-CM | POA: Diagnosis not present

## 2022-01-18 DIAGNOSIS — Z23 Encounter for immunization: Secondary | ICD-10-CM | POA: Diagnosis not present

## 2022-01-18 DIAGNOSIS — Z131 Encounter for screening for diabetes mellitus: Secondary | ICD-10-CM | POA: Diagnosis not present

## 2022-01-18 DIAGNOSIS — Z6828 Body mass index (BMI) 28.0-28.9, adult: Secondary | ICD-10-CM | POA: Diagnosis not present

## 2022-01-29 DIAGNOSIS — M47816 Spondylosis without myelopathy or radiculopathy, lumbar region: Secondary | ICD-10-CM | POA: Diagnosis not present

## 2022-02-07 DIAGNOSIS — F319 Bipolar disorder, unspecified: Secondary | ICD-10-CM | POA: Diagnosis not present

## 2022-02-21 DIAGNOSIS — M47816 Spondylosis without myelopathy or radiculopathy, lumbar region: Secondary | ICD-10-CM | POA: Diagnosis not present

## 2022-02-21 DIAGNOSIS — G894 Chronic pain syndrome: Secondary | ICD-10-CM | POA: Diagnosis not present

## 2022-02-21 DIAGNOSIS — Z9889 Other specified postprocedural states: Secondary | ICD-10-CM | POA: Diagnosis not present

## 2022-02-21 DIAGNOSIS — F1120 Opioid dependence, uncomplicated: Secondary | ICD-10-CM | POA: Diagnosis not present

## 2022-04-08 ENCOUNTER — Encounter: Payer: Self-pay | Admitting: Neurology

## 2022-04-08 ENCOUNTER — Ambulatory Visit: Payer: Medicare HMO | Admitting: Neurology

## 2022-04-08 VITALS — BP 131/81 | HR 70 | Ht 70.0 in | Wt 192.0 lb

## 2022-04-08 DIAGNOSIS — G250 Essential tremor: Secondary | ICD-10-CM | POA: Diagnosis not present

## 2022-04-08 MED ORDER — PRIMIDONE 50 MG PO TABS: ORAL_TABLET | ORAL | 3 refills | Status: DC

## 2022-04-08 MED ORDER — PROPRANOLOL HCL ER 120 MG PO CP24: 120.0000 mg | ORAL_CAPSULE | Freq: Two times a day (BID) | ORAL | 3 refills | Status: DC

## 2022-04-08 NOTE — Progress Notes (Signed)
Follow-up Visit   Date: 04/08/22    Gabriel Richardson MRN: [id number] DOB: [birth year]   Interim History: Gabriel Richardson is a 76 y.o. right-handed Caucasian male with history of depression, asthma, and hypertension followed here essential tremor.  He has history of trigeminal neuralgia.  The patient was accompanied to the clinic by self.  IMPRESSION/PLAN: Benign essential tremor, stable  - Continue primidone '100mg'$  twice daily  - Continue propranolol '120mg'$  twice daily.  OK to take an extra dose as needed  2.  Right trigeminal neuralgia, asymptomatic  - Previously tried:  Gabapentin, tegretol, Lyrica  - He takes lamictal '200mg'$  at bedtime as a mood stabilizer  Return to clinic in 1 year  --------------------------------------------------------- History of present illness: Initial visit 04/30/2013:  He had previously been a patient of Dr. [name] since 1995, initially for right trigeminal neuralgia which he had frequent flares of since 1997 - early 2000. He has tried trileptal, neurontin, and lyrica and had the most relief with Lyrica.  He has not had a flare since 2000, until 2016.    Around 2012, he started developing muscle cramps and left hand tremors and was concerned about multiple sclerosis. Although there was white matter changes on his MRI, Dr. [name] did not find that his exam and imaging was consistent with demyelinating disease. He also had CSF testing in January 2014 which did not show any inflammatory changes. He treated him symptomatically and was started on propranolol '60mg'$  which has helped with his tremors.  He takes robaxin '500mg'$  twice daily as needed which also resolves his muscle cramps.  He is s/p anterior cervical fusion by Dr. [name] in 2010 and has history of left L5 radiculopathy status post L5-S1 laminectomy.  He saw Dr. [name] in July 2016 for new left ptosis.  Myasthenia antibodies were checked and was normal.  He declined to have additional testing done to  evaluate this.  Late 2016, he began experiencing right facial shooting paresthesias over the upper and lower lip especially with light tactile stimuli, chewing, brushing, and talking. He has exquisite sensitivity over the right upper lip.  He denies painful paresthesias over the forehead and cheek.  His hand tremors are well-controlled on inderal '60mg'$  and he has noticed that it is improved with alcohol.  He has chronic low back pain and sees Dr. [name] for this and have been contemplating surgery.  He takes tramadol as needed.  He saw Dr. [name] for low back pain follow-up, but also discussed his trigeminal neuralgia who states that he is able to microvascular decompression surgery for this, if medications are ineffective.  He currently takes Lyrica '100mg'$  twice daily, because he is unable to tolerate extra dose in the afternoon (blurry vision, sedation).   Starting in 2018, his hand tremors started getting worse.  His propranolol was steadily increased to 220/d without benefit, so primidone was added and titrated to '150mg'$ /d.  UPDATE 04/02/2021:  Patient requested appointment to review his MRI spine which was ordered by neurosurgeon as part of work-up for ongoing weakness in the arms and legs.  He underwent thoracic T2-T3 laminectomy with resection of synovial cyst (March 2022) by Dr. Vertell Limber. He reports easy fatigability in the arms and legs. He uses a cane for assistance. No falls.  He complain of low back pain.  He has received ESI in the past which provided temporarily relief. He did not perform PT for arm or leg strengthening.  MRI cervical spine shows ACDF at C4-5  and C5-6 and mild canal stenosis at C7-T1 with mild-mod biforaminal stenosis. Diffuse marrow changes were seen.   MRI thoracic and lumbar spine shows resolution of T2-3 canal stenosis s/p laminectomy, L2 compression fracture,L5-S1 left subarticular recess and foraminal stenosis (severe).  These results were reviewed with pt by Dr.  Reatha Armour (for Dr. Vertell Limber) who who did not see any role for surgery.  UPDATE 04/08/2022:  He is here for follow-up visit.  Tremors are doing very well on primidone '100mg'$  BID and propranolol '120mg'$  BID.  He rarely takes an extra dose of propranolol.  No interval hospitalizations, illnesses, or falls.  He uses a cane for support due to low back pain, for which he gets ESI.  No new neurological complaints.   Medications:  Current Outpatient Medications on File Prior to Visit  Medication Sig Dispense Refill   acetaminophen (TYLENOL) 500 MG tablet Take 1,000 mg by mouth every 6 (six) hours as needed for mild pain or headache.     Ketorolac Tromethamine (TORADOL ORAL PO) Take 60 mg by mouth. Takes prn     lamoTRIgine (LAMICTAL) 200 MG tablet Take 200 mg by mouth at bedtime.  1   primidone (MYSOLINE) 50 MG tablet TAKE TWO TABLETS BY MOUTH TWICE A DAY 360 tablet 3   propranolol ER (INDERAL LA) 120 MG 24 hr capsule Take 1 capsule (120 mg total) by mouth 2 (two) times daily. OK to take extra tablet as needed for worsening tremor. 200 capsule 3   SHINGRIX injection      sildenafil (VIAGRA) 100 MG tablet Take 1 tablet (100 mg total) by mouth daily as needed for erectile dysfunction. 12 tablet 11   zolpidem (AMBIEN) 10 MG tablet Take 10 mg by mouth at bedtime. Takes 12.5 mg at bedtime  1   No current facility-administered medications on file prior to visit.    Allergies: No Known Allergies  Vital Signs:  BP 131/81   Pulse 70   Ht '5\' 10"'$  (1.778 m)   Wt 192 lb (87.1 kg)   SpO2 96%   BMI 27.55 kg/m    Neurological Exam: MENTAL STATUS including orientation to time, place, person, recent and remote memory, attention span and concentration, language, and fund of knowledge is normal.  Speech is not dysarthric.  CRANIAL NERVES: No visual field defects.  Pupils equal round and reactive to light.  Normal conjugate, extra-ocular eye movements in all directions of gaze.  Mild left ptosis (old).  Face is  symmetric. Palate elevates symmetrically.  Tongue is midline.  MOTOR:  Motor strength is 5/5 in all extremities. Mild postural tremor on the right > left hand.  No atrophy, fasciculations or abnormal movements.  No pronator drift.  Tone is normal.    COORDINATION/GAIT:  Normal finger-to- nose-finger.  Intact rapid alternating movements bilaterally.  Gait mildly wide-based and stable, assited with cane  Data: AChR blocking and binding 11/02/2012 - neg  CSF 06/02/2012:  R0  W1  G63  P43  IgG index 0.43   OCB -neg  MRI brain wwo contrast 07/08/2009: 1. Advanced white matter disease in a pattern compatible with multiple sclerosis. Mild progression since 2004. No areas of   enhancement.   2. No acute intracranial abnormality.  MRI thoracic and lumbar spine 03/16/2021: 1. Postsurgical changes from laminectomy at the T2-3 level with interval resolution of spinal canal stenosis. Improvement of the neural foraminal narrowing at this level, now mild bilaterally. 2. Normal appearance of the thoracic spinal cord on T2 weighted  images with heterogeneous signal on post contrast images in the cord from the C6 through the T2 level which is favored to represent artifact rather than contrast enhancement. However, pathological contrast enhancement cannot be excluded. In case of high clinical concern for cord lesion at this level, repeat images are recommended. 3. Acute/subacute compression fracture of the superior endplate of L2 with approximately 30% loss of vertebral body height without retropulsion. 4. Degenerative changes and anterolisthesis at L5-S1 resulting in severe narrowing of the left subarticular zone and severe left neural foraminal narrowing. 5. 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  MRI cervical spine wo contrast  03/16/2021: 1. Interval ACDF of C3-4 with slight retrolisthesis of C3 on C4. Mild canal  stenosis at this level has slightly improved from prior. 2. Prior ACDF at C4-5 and C5-6 with solid arthrodesis. No residual canal stenosis. 3. Stable degenerative changes of the cervical spine including borderline-mild canal stenosis at C7-T1 with moderate left and mild right foraminal stenosis. 4. Diffuse marrow heterogeneity without discrete marrow replacing lesion. Findings are nonspecific but can be seen in the setting of chronic anemia, smoking, and/or obesity.      Thank you for allowing me to participate in patient's care.  If I can answer any additional questions, I would be pleased to do so.    Sincerely,    Jahquan Klugh K. Posey Pronto, DO

## 2022-04-08 NOTE — Patient Instructions (Signed)
Return to clinic in 1 year.

## 2022-05-29 DIAGNOSIS — G8929 Other chronic pain: Secondary | ICD-10-CM | POA: Diagnosis not present

## 2022-05-29 DIAGNOSIS — M545 Low back pain, unspecified: Secondary | ICD-10-CM | POA: Diagnosis not present

## 2022-05-29 DIAGNOSIS — Z131 Encounter for screening for diabetes mellitus: Secondary | ICD-10-CM | POA: Diagnosis not present

## 2022-05-29 DIAGNOSIS — F3170 Bipolar disorder, currently in remission, most recent episode unspecified: Secondary | ICD-10-CM | POA: Diagnosis not present

## 2022-05-29 DIAGNOSIS — Z23 Encounter for immunization: Secondary | ICD-10-CM | POA: Diagnosis not present

## 2022-05-29 DIAGNOSIS — D7589 Other specified diseases of blood and blood-forming organs: Secondary | ICD-10-CM | POA: Diagnosis not present

## 2022-05-29 DIAGNOSIS — G4700 Insomnia, unspecified: Secondary | ICD-10-CM | POA: Diagnosis not present

## 2022-05-29 DIAGNOSIS — R030 Elevated blood-pressure reading, without diagnosis of hypertension: Secondary | ICD-10-CM | POA: Diagnosis not present

## 2022-05-29 DIAGNOSIS — E559 Vitamin D deficiency, unspecified: Secondary | ICD-10-CM | POA: Diagnosis not present

## 2022-05-29 DIAGNOSIS — N529 Male erectile dysfunction, unspecified: Secondary | ICD-10-CM | POA: Diagnosis not present

## 2022-05-29 DIAGNOSIS — G250 Essential tremor: Secondary | ICD-10-CM | POA: Diagnosis not present

## 2022-05-31 ENCOUNTER — Other Ambulatory Visit: Payer: Self-pay | Admitting: Neurology

## 2022-06-04 ENCOUNTER — Telehealth: Payer: Self-pay | Admitting: Neurology

## 2022-06-04 MED ORDER — PREGABALIN 100 MG PO CAPS: ORAL_CAPSULE | ORAL | 5 refills | Status: DC

## 2022-06-04 NOTE — Telephone Encounter (Signed)
I'm sorry to hear this.  In the past, his pain was controlled on Lyrica, so I will restart this.  I will send prescription for Lyrica '100mg'$  at bedtime x 1 week, then increase to '100mg'$  twice daily.  Let's have him schedule follow-up visit (ok to use work-in).

## 2022-06-04 NOTE — Telephone Encounter (Signed)
Called patient and informed him of the recommendations of Dr. Posey Pronto. Patient stated he will go and pick up the Lyrica and hopefully it helps. He is aware someone from the front will give him a call to get him scheduled for a f/u. Patient had no further questions or concerns.

## 2022-06-04 NOTE — Telephone Encounter (Signed)
Pt called in and left a message. He is having an episode of trigeminal neuralgia and would like to find out what he should do?

## 2022-06-04 NOTE — Telephone Encounter (Signed)
Called patient and left a message for a call back

## 2022-06-11 DIAGNOSIS — M47816 Spondylosis without myelopathy or radiculopathy, lumbar region: Secondary | ICD-10-CM | POA: Diagnosis not present

## 2022-06-11 DIAGNOSIS — F1120 Opioid dependence, uncomplicated: Secondary | ICD-10-CM | POA: Diagnosis not present

## 2022-06-19 ENCOUNTER — Ambulatory Visit: Payer: Medicare HMO | Admitting: Neurology

## 2022-06-19 ENCOUNTER — Telehealth: Payer: Self-pay | Admitting: Neurology

## 2022-06-19 NOTE — Progress Notes (Deleted)
Follow-up Visit   Date: 06/19/22    Gabriel Richardson MRN: [id number] DOB: [birth year]   Interim History: Gabriel Richardson is a 77 y.o. right-handed Caucasian male with history of depression, asthma, and hypertension followed here essential tremor.  He has history of trigeminal neuralgia.  The patient was accompanied to the clinic by self.  IMPRESSION/PLAN: Benign essential tremor, stable  - Continue primidone '100mg'$  twice daily  - Continue propranolol '120mg'$  twice daily.  OK to take an extra dose as needed  2.  Right trigeminal neuralgia, asymptomatic  - Previously tried:  Gabapentin, tegretol, Lyrica  - He takes lamictal '200mg'$  at bedtime as a mood stabilizer  Return to clinic in 1 year  --------------------------------------------------------- History of present illness: Initial visit 04/30/2013:  He had previously been a patient of Dr. [name] since 1995, initially for right trigeminal neuralgia which he had frequent flares of since 1997 - early 2000. He has tried trileptal, neurontin, and lyrica and had the most relief with Lyrica.  He has not had a flare since 2000, until 2016.    Around 2012, he started developing muscle cramps and left hand tremors and was concerned about multiple sclerosis. Although there was white matter changes on his MRI, Dr. [name] did not find that his exam and imaging was consistent with demyelinating disease. He also had CSF testing in January 2014 which did not show any inflammatory changes. He treated him symptomatically and was started on propranolol '60mg'$  which has helped with his tremors.  He takes robaxin '500mg'$  twice daily as needed which also resolves his muscle cramps.  He is s/p anterior cervical fusion by Dr. [name] in 2010 and has history of left L5 radiculopathy status post L5-S1 laminectomy.  He saw Dr. [name] in July 2016 for new left ptosis.  Myasthenia antibodies were checked and was normal.  He declined to have additional testing done to  evaluate this.  Late 2016, he began experiencing right facial shooting paresthesias over the upper and lower lip especially with light tactile stimuli, chewing, brushing, and talking. He has exquisite sensitivity over the right upper lip.  He denies painful paresthesias over the forehead and cheek.  His hand tremors are well-controlled on inderal '60mg'$  and he has noticed that it is improved with alcohol.  He has chronic low back pain and sees Dr. [name] for this and have been contemplating surgery.  He takes tramadol as needed.  He saw Dr. [name] for low back pain follow-up, but also discussed his trigeminal neuralgia who states that he is able to microvascular decompression surgery for this, if medications are ineffective.  He currently takes Lyrica '100mg'$  twice daily, because he is unable to tolerate extra dose in the afternoon (blurry vision, sedation).   Starting in 2018, his hand tremors started getting worse.  His propranolol was steadily increased to 220/d without benefit, so primidone was added and titrated to '150mg'$ /d.  UPDATE 04/02/2021:  Patient requested appointment to review his MRI spine which was ordered by neurosurgeon as part of work-up for ongoing weakness in the arms and legs.  He underwent thoracic T2-T3 laminectomy with resection of synovial cyst (March 2022) by Dr. Vertell Limber. He reports easy fatigability in the arms and legs. He uses a cane for assistance. No falls.  He complain of low back pain.  He has received ESI in the past which provided temporarily relief. He did not perform PT for arm or leg strengthening.  MRI cervical spine shows ACDF at C4-5  and C5-6 and mild canal stenosis at C7-T1 with mild-mod biforaminal stenosis. Diffuse marrow changes were seen.   MRI thoracic and lumbar spine shows resolution of T2-3 canal stenosis s/p laminectomy, L2 compression fracture,L5-S1 left subarticular recess and foraminal stenosis (severe).  These results were reviewed with pt by Dr.  Reatha Armour (for Dr. Vertell Limber) who who did not see any role for surgery.  UPDATE 04/08/2022:  He is here for follow-up visit.  Tremors are doing very well on primidone '100mg'$  BID and propranolol '120mg'$  BID.  He rarely takes an extra dose of propranolol.  No interval hospitalizations, illnesses, or falls.  He uses a cane for support due to low back pain, for which he gets ESI.  No new neurological complaints.   UPDATE 06/19/2022:  He is here for follow-up visit.  ***   Medications:  Current Outpatient Medications on File Prior to Visit  Medication Sig Dispense Refill   acetaminophen (TYLENOL) 500 MG tablet Take 1,000 mg by mouth every 6 (six) hours as needed for mild pain or headache.     Ketorolac Tromethamine (TORADOL ORAL PO) Take 60 mg by mouth. Takes prn     lamoTRIgine (LAMICTAL) 200 MG tablet Take 200 mg by mouth at bedtime.  1   pregabalin (LYRICA) 100 MG capsule Take 1 tablet daily x 1 week, then increase to 1 tablet twice daily. 60 capsule 5   primidone (MYSOLINE) 50 MG tablet TAKE TWO TABLETS BY MOUTH TWICE A DAY 360 tablet 3   propranolol ER (INDERAL LA) 120 MG 24 hr capsule Take 1 capsule (120 mg total) by mouth 2 (two) times daily. OK to take extra tablet as needed for worsening tremor. 200 capsule 3   SHINGRIX injection      sildenafil (VIAGRA) 100 MG tablet Take 1 tablet (100 mg total) by mouth daily as needed for erectile dysfunction. 12 tablet 11   zolpidem (AMBIEN) 10 MG tablet Take 10 mg by mouth at bedtime. Takes 12.5 mg at bedtime  1   No current facility-administered medications on file prior to visit.    Allergies: No Known Allergies  Vital Signs:  There were no vitals taken for this visit.   Neurological Exam: MENTAL STATUS including orientation to time, place, person, recent and remote memory, attention span and concentration, language, and fund of knowledge is normal.  Speech is not dysarthric.  CRANIAL NERVES: No visual field defects.  Pupils equal round and reactive  to light.  Normal conjugate, extra-ocular eye movements in all directions of gaze.  Mild left ptosis (old).  Face is symmetric. Palate elevates symmetrically.  Tongue is midline.  MOTOR:  Motor strength is 5/5 in all extremities. Mild postural tremor on the right > left hand.  No atrophy, fasciculations or abnormal movements.  No pronator drift.  Tone is normal.    COORDINATION/GAIT:  Normal finger-to- nose-finger.  Intact rapid alternating movements bilaterally.  Gait mildly wide-based and stable, assited with cane  Data: AChR blocking and binding 11/02/2012 - neg  CSF 06/02/2012:  R0  W1  G63  P43  IgG index 0.43   OCB -neg  MRI brain wwo contrast 07/08/2009: 1. Advanced white matter disease in a pattern compatible with multiple sclerosis. Mild progression since 2004. No areas of   enhancement.   2. No acute intracranial abnormality.  MRI thoracic and lumbar spine 03/16/2021: 1. Postsurgical changes from laminectomy at the T2-3 level with interval resolution of spinal canal stenosis. Improvement of the neural foraminal narrowing at this  level, now mild bilaterally. 2. Normal appearance of the thoracic spinal cord on T2 weighted images with heterogeneous signal on post contrast images in the cord from the C6 through the T2 level which is favored to represent artifact rather than contrast enhancement. However, pathological contrast enhancement cannot be excluded. In case of high clinical concern for cord lesion at this level, repeat images are recommended. 3. Acute/subacute compression fracture of the superior endplate of L2 with approximately 30% loss of vertebral body height without retropulsion. 4. Degenerative changes and anterolisthesis at L5-S1 resulting in severe narrowing of the left subarticular zone and severe left neural foraminal narrowing. 5. 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  MRI  cervical spine wo contrast  03/16/2021: 1. Interval ACDF of C3-4 with slight retrolisthesis of C3 on C4. Mild canal stenosis at this level has slightly improved from prior. 2. Prior ACDF at C4-5 and C5-6 with solid arthrodesis. No residual canal stenosis. 3. Stable degenerative changes of the cervical spine including borderline-mild canal stenosis at C7-T1 with moderate left and mild right foraminal stenosis. 4. Diffuse marrow heterogeneity without discrete marrow replacing lesion. Findings are nonspecific but can be seen in the setting of chronic anemia, smoking, and/or obesity.      Thank you for allowing me to participate in patient's care.  If I can answer any additional questions, I would be pleased to do so.    Sincerely,    Telesforo Brosnahan K. Posey Pronto, DO

## 2022-06-19 NOTE — Telephone Encounter (Signed)
Pt called in to confirm the time of his appt today thinking it was at 3:30, but he had already missed his appt. He wanted to see if he could talk with someone about his medicine?

## 2022-06-20 NOTE — Telephone Encounter (Signed)
Called patient and left a message for a call back

## 2022-06-20 NOTE — Telephone Encounter (Signed)
Called patient and informed him of Dr. Serita Grit recommendation that he may take lyrica '100mg'$  BID with oxycodone (prescribed by pain management). He may get more sleepy with both pain medications. Patient verbalized understanding and had further questions or concerns.

## 2022-06-20 NOTE — Telephone Encounter (Signed)
Pt called in and left a message. He was returning our call

## 2022-06-20 NOTE — Telephone Encounter (Signed)
He may take lyrica '100mg'$  BID with oxycodone (prescribed by pain management).  He may get more sleepy with both pain medications.

## 2022-07-03 DIAGNOSIS — R112 Nausea with vomiting, unspecified: Secondary | ICD-10-CM | POA: Diagnosis not present

## 2022-07-04 DIAGNOSIS — H524 Presbyopia: Secondary | ICD-10-CM | POA: Diagnosis not present

## 2022-07-04 DIAGNOSIS — H35033 Hypertensive retinopathy, bilateral: Secondary | ICD-10-CM | POA: Diagnosis not present

## 2022-07-08 ENCOUNTER — Ambulatory Visit: Payer: Medicare HMO | Admitting: Neurology

## 2022-07-08 DIAGNOSIS — F319 Bipolar disorder, unspecified: Secondary | ICD-10-CM | POA: Diagnosis not present

## 2022-08-06 ENCOUNTER — Ambulatory Visit: Payer: Medicare HMO | Admitting: Neurology

## 2022-08-13 ENCOUNTER — Encounter: Payer: Self-pay | Admitting: Neurology

## 2022-08-13 ENCOUNTER — Ambulatory Visit: Payer: Medicare HMO | Admitting: Neurology

## 2022-08-13 VITALS — BP 132/83 | HR 77 | Ht 70.0 in | Wt 199.0 lb

## 2022-08-13 DIAGNOSIS — G250 Essential tremor: Secondary | ICD-10-CM

## 2022-08-13 DIAGNOSIS — G500 Trigeminal neuralgia: Secondary | ICD-10-CM | POA: Diagnosis not present

## 2022-08-13 MED ORDER — PREGABALIN 100 MG PO CAPS: 100.0000 mg | ORAL_CAPSULE | Freq: Two times a day (BID) | ORAL | 5 refills | Status: DC

## 2022-08-13 NOTE — Progress Notes (Signed)
Follow-up Visit   Date: 04/08/22    Gabriel Richardson MRN: [id number] DOB: 19-May-1946   Interim History: Gabriel Richardson is a 77 y.o. right-handed Caucasian male with history of depression, asthma, and hypertension followed here essential tremor.  He has history of trigeminal neuralgia.  The patient was accompanied to the clinic by self.  IMPRESSION/PLAN: Benign essential tremor, stable  - Continue primidone 100mg  twice daily  - Continue propranolol 120mg  twice daily.  OK to take an extra dose as needed  2.  Right trigeminal neuralgia with exacerbation of pain in January.  His last flare was about 10-15 years ago.   - Previously tried:  Gabapentin, tegretol  - Continue Lyrica 100mg  twice daily  - He takes lamictal 200mg  at bedtime as a mood stabilizer  Return to clinic in 1 year  --------------------------------------------------------- History of present illness: Initial visit 04/30/2013:  He had previously been a patient of Dr. [name] since 1995, initially for right trigeminal neuralgia which he had frequent flares of since 1997 - early 2000. He has tried trileptal, neurontin, and lyrica and had the most relief with Lyrica.  He has not had a flare since 2000, until 2016, and again in 2024.    Around 2012, he started developing muscle cramps and left hand tremors and was concerned about multiple sclerosis. Although there was white matter changes on his MRI, Dr. [name] did not find that his exam and imaging was consistent with demyelinating disease. He also had CSF testing in January 2014 which did not show any inflammatory changes. He treated him symptomatically and was started on propranolol 60mg  which has helped with his tremors.   He is s/p anterior cervical fusion by Dr. [name] in 2010 and has history of left L5 radiculopathy status post L5-S1 laminectomy.  He saw Dr. [name] in July 2016 for new left ptosis.  Myasthenia antibodies were checked and was normal.  He declined to have  additional testing done to evaluate this.  Starting in 2018, his hand tremors started getting worse.  His propranolol was steadily increased to 220/d without benefit, so primidone was added and titrated to 150mg /d.  UPDATE 04/08/2022:  He is here for follow-up visit.  Tremors are doing very well on primidone 100mg  BID and propranolol 120mg  BID.  He rarely takes an extra dose of propranolol.  No interval hospitalizations, illnesses, or falls.  He uses a cane for support due to low back pain, for which he gets ESI.  No new neurological complaints.   UPDATE 08/13/2022:  He is here for sooner than scheduled visit due to recurrence of right trigeminal neuralgia.  He started having severe right sided facial pain and was started on Lyrica 100mg  twice daily.  Pain is relieved with Lyrica and he no longer has facial pain.  He has stopped using hydrocodone for low back pain since starting Lyrica.  Tremors remain stable on primidone 100mg  BID and propranolol 120mg  BID.   Medications:  Current Outpatient Medications on File Prior to Visit  Medication Sig Dispense Refill   acetaminophen (TYLENOL) 500 MG tablet Take 1,000 mg by mouth every 6 (six) hours as needed for mild pain or headache.     Ketorolac Tromethamine (TORADOL ORAL PO) Take 60 mg by mouth. Takes prn     lamoTRIgine (LAMICTAL) 200 MG tablet Take 200 mg by mouth at bedtime.  1   primidone (MYSOLINE) 50 MG tablet TAKE TWO TABLETS BY MOUTH TWICE A DAY 360 tablet 3  propranolol ER (INDERAL LA) 120 MG 24 hr capsule Take 1 capsule (120 mg total) by mouth 2 (two) times daily. OK to take extra tablet as needed for worsening tremor. 200 capsule 3   SHINGRIX injection      sildenafil (VIAGRA) 100 MG tablet Take 1 tablet (100 mg total) by mouth daily as needed for erectile dysfunction. 12 tablet 11   zolpidem (AMBIEN) 10 MG tablet Take 10 mg by mouth at bedtime. Takes 12.5 mg at bedtime  1   No current facility-administered medications on file prior to  visit.    Allergies: No Known Allergies  Vital Signs:  BP 131/81   Pulse 70   Ht 5\' 10"  (1.778 m)   Wt 192 lb (87.1 kg)   SpO2 96%   BMI 27.55 kg/m    Neurological Exam: MENTAL STATUS including orientation to time, place, person, recent and remote memory, attention span and concentration, language, and fund of knowledge is normal.  Speech is not dysarthric.  CRANIAL NERVES:  Pupils equal round and reactive to light.  Normal conjugate, extra-ocular eye movements in all directions of gaze.  Mild left ptosis (old).  Face is symmetric.   MOTOR:  Motor strength is 5/5 in all extremities. Mild postural tremor on the right > left hand.  No atrophy, fasciculations or abnormal movements.  No pronator drift.  Tone is normal.    COORDINATION/GAIT:  Normal finger-to- nose-finger.  Intact rapid alternating movements bilaterally.  Gait mildly wide-based and stable, assited with cane  Data: AChR blocking and binding 11/02/2012 - neg  CSF 06/02/2012:  R0  W1  G63  P43  IgG index 0.43   OCB -neg  MRI brain wwo contrast 07/08/2009: 1. Advanced white matter disease in a pattern compatible with multiple sclerosis. Mild progression since 2004. No areas of   enhancement.   2. No acute intracranial abnormality.  MRI thoracic and lumbar spine 03/16/2021: 1. Postsurgical changes from laminectomy at the T2-3 level with interval resolution of spinal canal stenosis. Improvement of the neural foraminal narrowing at this level, now mild bilaterally. 2. Normal appearance of the thoracic spinal cord on T2 weighted images with heterogeneous signal on post contrast images in the cord from the C6 through the T2 level which is favored to represent artifact rather than contrast enhancement. However, pathological contrast enhancement cannot be excluded. In case of high clinical concern for cord lesion at this level, repeat images are recommended. 3. Acute/subacute compression fracture of the superior endplate  of L2 with approximately 30% loss of vertebral body height without retropulsion. 4. Degenerative changes and anterolisthesis at L5-S1 resulting in severe narrowing of the left subarticular zone and severe left neural foraminal narrowing. 5. 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  MRI cervical spine wo contrast  03/16/2021: 1. Interval ACDF of C3-4 with slight retrolisthesis of C3 on C4. Mild canal stenosis at this level has slightly improved from prior. 2. Prior ACDF at C4-5 and C5-6 with solid arthrodesis. No residual canal stenosis. 3. Stable degenerative changes of the cervical spine including borderline-mild canal stenosis at C7-T1 with moderate left and mild right foraminal stenosis. 4. Diffuse marrow heterogeneity without discrete marrow replacing lesion. Findings are nonspecific but can be seen in the setting of chronic anemia, smoking, and/or obesity.      Thank you for allowing me to participate in patient's care.  If I can answer any additional questions, I would be pleased  to do so.    Sincerely,    Kydan Shanholtzer K. Posey Pronto, DO

## 2022-08-13 NOTE — Patient Instructions (Signed)
Return to clinic in 1 year.

## 2022-09-03 DIAGNOSIS — J069 Acute upper respiratory infection, unspecified: Secondary | ICD-10-CM | POA: Diagnosis not present

## 2022-09-03 DIAGNOSIS — Z0000 Encounter for general adult medical examination without abnormal findings: Secondary | ICD-10-CM | POA: Diagnosis not present

## 2022-09-03 DIAGNOSIS — F339 Major depressive disorder, recurrent, unspecified: Secondary | ICD-10-CM | POA: Diagnosis not present

## 2022-09-06 ENCOUNTER — Encounter: Payer: Self-pay | Admitting: Gastroenterology

## 2022-09-10 DIAGNOSIS — F1120 Opioid dependence, uncomplicated: Secondary | ICD-10-CM | POA: Diagnosis not present

## 2022-09-10 DIAGNOSIS — Z9889 Other specified postprocedural states: Secondary | ICD-10-CM | POA: Diagnosis not present

## 2022-09-10 DIAGNOSIS — Z1330 Encounter for screening examination for mental health and behavioral disorders, unspecified: Secondary | ICD-10-CM | POA: Diagnosis not present

## 2022-09-10 DIAGNOSIS — M47816 Spondylosis without myelopathy or radiculopathy, lumbar region: Secondary | ICD-10-CM | POA: Diagnosis not present

## 2022-09-18 ENCOUNTER — Telehealth: Payer: Self-pay | Admitting: Neurology

## 2022-09-18 DIAGNOSIS — M542 Cervicalgia: Secondary | ICD-10-CM | POA: Diagnosis not present

## 2022-09-18 DIAGNOSIS — G44311 Acute post-traumatic headache, intractable: Secondary | ICD-10-CM | POA: Diagnosis not present

## 2022-09-18 DIAGNOSIS — W19XXXA Unspecified fall, initial encounter: Secondary | ICD-10-CM | POA: Diagnosis not present

## 2022-09-18 DIAGNOSIS — M62838 Other muscle spasm: Secondary | ICD-10-CM | POA: Diagnosis not present

## 2022-09-18 NOTE — Telephone Encounter (Signed)
Pt called in stating he was at the [facility] walk in clinic due to a fall and they took some x rays and recommended him to have a CT scan. He was told a specialist would have to order the CT scan. He would like to see if Dr. Allena Katz could order the CT scan for him?

## 2022-09-19 NOTE — Telephone Encounter (Signed)
Called and spoke to patient he states that he went to the [facility] in clinic on [address] for a sore neck which he reports he cannot turn his neck without turning his whole body. Patient states that he isn't sure what exactly they want to CT but he will call them and have them send over their notes from his visit with them. Patient did state that he has had prior surgeries to his upper back/neck area but no longer follows up with those doctors since it has been years since the operation. Patient is aware that Dr. Allena Katz is out this week. He would like Dr. Allena Katz to review his office notes that he is having faxed over to [place]. Patient has been provided our office fax number.

## 2022-09-25 ENCOUNTER — Telehealth: Payer: Self-pay | Admitting: Anesthesiology

## 2022-09-25 NOTE — Telephone Encounter (Signed)
Please have him set up follow-up visit to discuss neck symptoms.  XR cervical spine shows stable postop and surgical changes, no new abnormalities.

## 2022-09-25 NOTE — Telephone Encounter (Signed)
Called patient and informed him that I spoke to him on 5/1 and requested he get [place] the notes from the [place] walk in clinic. I informed patient that we have not received anything yet. Patient stated he will give them a call and have them send it to me again. Patient stated that he had another fall this weekend.

## 2022-09-25 NOTE — Telephone Encounter (Signed)
Pt called stating he fell and hurt his head, states he went to [facility] in Clinic and was advised he needed a CT scan. States his was advised to call his neurologist for a follow up and see if they can order a CT scan for him.

## 2022-09-26 NOTE — Telephone Encounter (Signed)
Called patient and informed him that Dr. Allena Katz would like him to set up a follow up-visit to discuss his eck symptoms. Informed patient that XR Cervical Spine shows stable postop and surgical changes, no new abnormalities. Patient verbalized understanding and is ok with scheduling a follow up. Patient is aware someone from the front will give him a call to get him scheduled with Dr. Allena Katz. Patient had no further questions or concerns.

## 2022-10-01 DIAGNOSIS — M4807 Spinal stenosis, lumbosacral region: Secondary | ICD-10-CM | POA: Diagnosis not present

## 2022-10-01 DIAGNOSIS — M542 Cervicalgia: Secondary | ICD-10-CM | POA: Diagnosis not present

## 2022-10-01 DIAGNOSIS — F3170 Bipolar disorder, currently in remission, most recent episode unspecified: Secondary | ICD-10-CM | POA: Diagnosis not present

## 2022-10-01 DIAGNOSIS — R296 Repeated falls: Secondary | ICD-10-CM | POA: Diagnosis not present

## 2022-10-01 DIAGNOSIS — G250 Essential tremor: Secondary | ICD-10-CM | POA: Diagnosis not present

## 2022-10-01 DIAGNOSIS — J449 Chronic obstructive pulmonary disease, unspecified: Secondary | ICD-10-CM | POA: Diagnosis not present

## 2022-10-01 DIAGNOSIS — I700 Atherosclerosis of aorta: Secondary | ICD-10-CM | POA: Diagnosis not present

## 2022-10-01 DIAGNOSIS — G500 Trigeminal neuralgia: Secondary | ICD-10-CM | POA: Diagnosis not present

## 2022-10-01 DIAGNOSIS — G9520 Unspecified cord compression: Secondary | ICD-10-CM | POA: Diagnosis not present

## 2022-10-10 DIAGNOSIS — H18413 Arcus senilis, bilateral: Secondary | ICD-10-CM | POA: Diagnosis not present

## 2022-10-10 DIAGNOSIS — H25043 Posterior subcapsular polar age-related cataract, bilateral: Secondary | ICD-10-CM | POA: Diagnosis not present

## 2022-10-10 DIAGNOSIS — H25013 Cortical age-related cataract, bilateral: Secondary | ICD-10-CM | POA: Diagnosis not present

## 2022-10-10 DIAGNOSIS — H2512 Age-related nuclear cataract, left eye: Secondary | ICD-10-CM | POA: Diagnosis not present

## 2022-10-10 DIAGNOSIS — H2513 Age-related nuclear cataract, bilateral: Secondary | ICD-10-CM | POA: Diagnosis not present

## 2022-10-15 ENCOUNTER — Encounter: Payer: Self-pay | Admitting: Neurology

## 2022-10-15 ENCOUNTER — Ambulatory Visit: Payer: Medicare HMO | Admitting: Neurology

## 2022-10-15 VITALS — BP 152/81 | HR 68 | Ht 70.0 in | Wt 195.0 lb

## 2022-10-15 DIAGNOSIS — G8928 Other chronic postprocedural pain: Secondary | ICD-10-CM | POA: Diagnosis not present

## 2022-10-15 DIAGNOSIS — Z9889 Other specified postprocedural states: Secondary | ICD-10-CM | POA: Diagnosis not present

## 2022-10-15 DIAGNOSIS — G250 Essential tremor: Secondary | ICD-10-CM

## 2022-10-15 DIAGNOSIS — W19XXXA Unspecified fall, initial encounter: Secondary | ICD-10-CM

## 2022-10-15 DIAGNOSIS — M542 Cervicalgia: Secondary | ICD-10-CM

## 2022-10-15 DIAGNOSIS — R519 Headache, unspecified: Secondary | ICD-10-CM

## 2022-10-15 DIAGNOSIS — G500 Trigeminal neuralgia: Secondary | ICD-10-CM

## 2022-10-15 NOTE — Progress Notes (Signed)
Follow-up Visit   Date: 10/15/22    Gabriel Richardson MRN: [id number] DOB: Jan 23, 1946   Interim History: Gabriel Richardson is a 77 y.o. right-handed Caucasian male with history of depression, asthma, and hypertension followed here essential tremor.  He has history of trigeminal neuralgia.  The patient was accompanied to the clinic by self.  IMPRESSION/PLAN: Benign essential tremor, stable  - Continue primidone 100mg  twice daily  - Continue propranolol 120mg  twice daily.  OK to take an extra dose as needed  2.  Right trigeminal neuralgia with exacerbation of pain in January.  His last flare was about 10-15 years ago. Pain is controlled on Lyrica.   - Previously tried:  Gabapentin, tegretol  - Continue Lyrica 100mg  twice daily  - He takes lamictal 200mg  at bedtime as a mood stabilizer  3.  Cervical canal stenosis s/p ACDF C3-4, C4-5, and C5-6 with new neck pain   - MRI cervical spine wo contrast  - Start neck PT  4.  New onset headaches > 50 years  - MRI brain wo contrast  Further recommendations pending results.   --------------------------------------------------------- History of present illness: Initial visit 04/30/2013:  He had previously been a patient of Dr. Sandria Manly since 1995, initially for right trigeminal neuralgia which he had frequent flares of since 1997 - early 2000. He has tried trileptal, neurontin, and lyrica and had the most relief with Lyrica.  He has not had a flare since 2000, until 2016, and again in 2024.    Around 2012, he started developing muscle cramps and left hand tremors and was concerned about multiple sclerosis. Although there was white matter changes on his MRI, Dr. Sandria Manly did not find that his exam and imaging was consistent with demyelinating disease. He also had CSF testing in January 2014 which did not show any inflammatory changes. He treated him symptomatically and was started on propranolol 60mg  which has helped with his tremors.   He is s/p  anterior cervical fusion by Dr. Trey Sailors in 2010 and has history of left L5 radiculopathy status post L5-S1 laminectomy.  He saw Dr. Hosie Poisson in July 2016 for new left ptosis.  Myasthenia antibodies were checked and was normal.  He declined to have additional testing done to evaluate this.  Starting in 2018, his hand tremors started getting worse.  His propranolol was steadily increased to 220/d without benefit, so primidone was added and titrated to 150mg /d.  UPDATE 04/08/2022:  He is here for follow-up visit.  Tremors are doing very well on primidone 100mg  BID and propranolol 120mg  BID.  He rarely takes an extra dose of propranolol.  No interval hospitalizations, illnesses, or falls.  He uses a cane for support due to low back pain, for which he gets ESI.  No new neurological complaints.   UPDATE 08/13/2022:  He is here for sooner than scheduled visit due to recurrence of right trigeminal neuralgia.  He started having severe right sided facial pain and was started on Lyrica 100mg  twice daily.  Pain is relieved with Lyrica and he no longer has facial pain.  He has stopped using hydrocodone for low back pain since starting Lyrica.  Tremors remain stable on primidone 100mg  BID and propranolol 120mg  BID.   UPDATE 10/15/2022:  He had two falls about a month ago and since this time he has neck stiffness, unable to rotate his head and has severe pain.  He notices that he is lightheaded when he tries to standup.  He complains  of left sided head which is new.   Trigeminal neuralgia is much better controlled on Lyrica 100mg  twice daily.  He continues to have tremors of the hands which are stable, he has good days and bad days.  He is compliant with primidone 100mg  BID and propranolol 120mg  BID.   Medications:  Current Outpatient Medications on File Prior to Visit  Medication Sig Dispense Refill   acetaminophen (TYLENOL) 500 MG tablet Take 1,000 mg by mouth every 6 (six) hours as needed for mild pain or headache.      lamoTRIgine (LAMICTAL) 200 MG tablet Take 200 mg by mouth at bedtime.  1   pregabalin (LYRICA) 100 MG capsule Take 1 capsule (100 mg total) by mouth 2 (two) times daily. Take 1 tablet daily twice daily. 60 capsule 5   primidone (MYSOLINE) 50 MG tablet TAKE TWO TABLETS BY MOUTH TWICE A DAY 360 tablet 3   propranolol ER (INDERAL LA) 120 MG 24 hr capsule Take 1 capsule (120 mg total) by mouth 2 (two) times daily. OK to take extra tablet as needed for worsening tremor. 200 capsule 3   SHINGRIX injection      sildenafil (VIAGRA) 100 MG tablet Take 1 tablet (100 mg total) by mouth daily as needed for erectile dysfunction. 12 tablet 11   zolpidem (AMBIEN) 10 MG tablet Take 10 mg by mouth at bedtime. Takes 12.5 mg at bedtime  1   Ketorolac Tromethamine (TORADOL ORAL PO) Take 60 mg by mouth. Takes prn (Patient not taking: Reported on 10/15/2022)     No current facility-administered medications on file prior to visit.    Allergies: No Known Allergies  Vital Signs:  BP (!) 152/81   Pulse 68   Ht 5\' 10"  (1.778 m)   Wt 195 lb (88.5 kg)   SpO2 93%   BMI 27.98 kg/m    Neurological Exam: MENTAL STATUS including orientation to time, place, person, recent and remote memory, attention span and concentration, language, and fund of knowledge is normal.  Speech is not dysarthric.  CRANIAL NERVES:  Pupils equal round and reactive to light.  Normal conjugate, extra-ocular eye movements in all directions of gaze.  Mild left ptosis (old).  Face is symmetric.   MOTOR:  Motor strength is 5/5 in all extremities. Mild postural tremor on the right > left hand.  No atrophy, fasciculations or abnormal movements.  No pronator drift.  Tone is normal.    MSRs:                                           Right        Left brachioradialis 2+  2+  biceps 2+  2+  triceps 2+  2+  patellar 2+  0  ankle jerk 0  0   COORDINATION/GAIT:  Normal finger-to- nose-finger.  Intact rapid alternating movements bilaterally.  Gait  mildly wide-based and stable, assited with cane  Data: AChR blocking and binding 11/02/2012 - neg  CSF 06/02/2012:  R0  W1  G63  P43  IgG index 0.43   OCB -neg  MRI brain wwo contrast 07/08/2009: 1. Advanced white matter disease in a pattern compatible with multiple sclerosis. Mild progression since 2004. No areas of   enhancement.   2. No acute intracranial abnormality.  MRI thoracic and lumbar spine 03/16/2021: 1. Postsurgical changes from laminectomy at the T2-3 level with  interval resolution of spinal canal stenosis. Improvement of the neural foraminal narrowing at this level, now mild bilaterally. 2. Normal appearance of the thoracic spinal cord on T2 weighted images with heterogeneous signal on post contrast images in the cord from the C6 through the T2 level which is favored to represent artifact rather than contrast enhancement. However, pathological contrast enhancement cannot be excluded. In case of high clinical concern for cord lesion at this level, repeat images are recommended. 3. Acute/subacute compression fracture of the superior endplate of L2 with approximately 30% loss of vertebral body height without retropulsion. 4. Degenerative changes and anterolisthesis at L5-S1 resulting in severe narrowing of the left subarticular zone and severe left neural foraminal narrowing. 5. 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  MRI cervical spine wo contrast  03/16/2021: 1. Interval ACDF of C3-4 with slight retrolisthesis of C3 on C4. Mild canal stenosis at this level has slightly improved from prior. 2. Prior ACDF at C4-5 and C5-6 with solid arthrodesis. No residual canal stenosis. 3. Stable degenerative changes of the cervical spine including borderline-mild canal stenosis at C7-T1 with moderate left and mild right foraminal stenosis. 4. Diffuse marrow heterogeneity without discrete marrow  replacing lesion. Findings are nonspecific but can be seen in the setting of chronic anemia, smoking, and/or obesity.    Thank you for allowing me to participate in patient's care.  If I can answer any additional questions, I would be pleased to do so.    Sincerely,    Timaya Bojarski K. Allena Katz, DO

## 2022-10-15 NOTE — Patient Instructions (Signed)
MRI brain and cervical spine  Start physical therapy

## 2022-10-28 ENCOUNTER — Encounter: Payer: Self-pay | Admitting: Neurology

## 2022-11-02 ENCOUNTER — Ambulatory Visit
Admission: RE | Admit: 2022-11-02 | Discharge: 2022-11-02 | Disposition: A | Discharge status: Home / Self Care | Payer: Medicare HMO | Source: Ambulatory Visit | Attending: Neurology | Admitting: Neurology

## 2022-11-02 DIAGNOSIS — Z9889 Other specified postprocedural states: Secondary | ICD-10-CM

## 2022-11-02 DIAGNOSIS — G250 Essential tremor: Secondary | ICD-10-CM

## 2022-11-02 DIAGNOSIS — G500 Trigeminal neuralgia: Secondary | ICD-10-CM

## 2022-11-02 DIAGNOSIS — M542 Cervicalgia: Secondary | ICD-10-CM | POA: Diagnosis not present

## 2022-11-02 DIAGNOSIS — Z981 Arthrodesis status: Secondary | ICD-10-CM | POA: Diagnosis not present

## 2022-11-02 DIAGNOSIS — R519 Headache, unspecified: Secondary | ICD-10-CM

## 2022-11-05 ENCOUNTER — Telehealth: Payer: Self-pay | Admitting: Neurology

## 2022-11-05 NOTE — Telephone Encounter (Signed)
Patient had the MRI scan done on Saturday, and wanted to know the process on finding out the results. Patient was advised that a nurse will call him with results or to answer any questions he may have ./ KB

## 2022-11-06 NOTE — Telephone Encounter (Signed)
Called patient and informed him that we have not received results yet but as soon as we do we will give him a call. Patient verbalized understanding and had no further questions or concerns.

## 2022-11-08 ENCOUNTER — Telehealth: Payer: Self-pay | Admitting: Neurology

## 2022-11-08 NOTE — Telephone Encounter (Signed)
Pt left a VM stating that he would like the results of the MRI he had done please call back

## 2022-11-10 ENCOUNTER — Emergency Department (HOSPITAL_COMMUNITY)
Admission: EM | Admit: 2022-11-10 | Discharge: 2022-11-10 | Disposition: A | Discharge status: Home / Self Care | Payer: Medicare HMO | Attending: Emergency Medicine | Admitting: Emergency Medicine

## 2022-11-10 ENCOUNTER — Encounter (HOSPITAL_COMMUNITY): Payer: Self-pay | Admitting: Emergency Medicine

## 2022-11-10 ENCOUNTER — Other Ambulatory Visit: Payer: Self-pay

## 2022-11-10 ENCOUNTER — Emergency Department (HOSPITAL_COMMUNITY): Payer: Medicare HMO

## 2022-11-10 DIAGNOSIS — S12100K Unspecified displaced fracture of second cervical vertebra, subsequent encounter for fracture with nonunion: Secondary | ICD-10-CM

## 2022-11-10 DIAGNOSIS — S12110A Anterior displaced Type II dens fracture, initial encounter for closed fracture: Secondary | ICD-10-CM | POA: Diagnosis not present

## 2022-11-10 DIAGNOSIS — G319 Degenerative disease of nervous system, unspecified: Secondary | ICD-10-CM | POA: Diagnosis not present

## 2022-11-10 DIAGNOSIS — Z981 Arthrodesis status: Secondary | ICD-10-CM | POA: Diagnosis not present

## 2022-11-10 DIAGNOSIS — W01198A Fall on same level from slipping, tripping and stumbling with subsequent striking against other object, initial encounter: Secondary | ICD-10-CM | POA: Diagnosis not present

## 2022-11-10 DIAGNOSIS — R58 Hemorrhage, not elsewhere classified: Secondary | ICD-10-CM | POA: Diagnosis not present

## 2022-11-10 DIAGNOSIS — M546 Pain in thoracic spine: Secondary | ICD-10-CM | POA: Insufficient documentation

## 2022-11-10 DIAGNOSIS — S0003XA Contusion of scalp, initial encounter: Secondary | ICD-10-CM | POA: Insufficient documentation

## 2022-11-10 DIAGNOSIS — S0990XA Unspecified injury of head, initial encounter: Secondary | ICD-10-CM | POA: Diagnosis not present

## 2022-11-10 DIAGNOSIS — S12121K Other nondisplaced dens fracture, subsequent encounter for fracture with nonunion: Secondary | ICD-10-CM | POA: Insufficient documentation

## 2022-11-10 DIAGNOSIS — S12100A Unspecified displaced fracture of second cervical vertebra, initial encounter for closed fracture: Secondary | ICD-10-CM | POA: Diagnosis not present

## 2022-11-10 DIAGNOSIS — R9431 Abnormal electrocardiogram [ECG] [EKG]: Secondary | ICD-10-CM | POA: Diagnosis not present

## 2022-11-10 DIAGNOSIS — W19XXXA Unspecified fall, initial encounter: Secondary | ICD-10-CM | POA: Diagnosis not present

## 2022-11-10 NOTE — ED Triage Notes (Addendum)
Pt in from home in via [place] after trip fall on hardwood floors. Pt denies LOC, neck pain or thinners. Pt did hit posterior head and has contusion with small lac present. Arrives a&ox4

## 2022-11-10 NOTE — Discharge Instructions (Signed)
Wear the collar at all times except for showering.  Follow up with neurosurgery in the office.

## 2022-11-10 NOTE — ED Provider Notes (Signed)
[name] [facility]ERGENCY DEPARTMENT AT [facility] Provider Note   CSN: [id number] Arrival date & time: 11/10/22  0340     History  Chief Complaint  Patient presents with   Fall   Head Injury    Gabriel Richardson is a 77 y.o. male.  77 yo M with a chief complaints of a fall.  Patient states that he has had difficulties with his balance and thinks he lost his balance this evening and struck the back of his head.  He has had neck pain that is gotten worse over the past 6 Chow or so.  Is complaining mostly of upper back pain and headache.  He later then tells me that he thinks his wife is overreacting sending him here.  He denies any chest pain difficulty breathing denies extremity pain.   Fall  Head Injury      Home Medications Prior to Admission medications   Medication Sig Start Date End Date Taking? Authorizing Provider  acetaminophen (TYLENOL) 500 MG tablet Take 1,000 mg by mouth every 6 (six) hours as needed for mild pain or headache.    [provider]  Ketorolac Tromethamine (TORADOL ORAL PO) Take 60 mg by mouth. Takes prn Patient not taking: Reported on 10/15/2022    [provider]  lamoTRIgine (LAMICTAL) 200 MG tablet Take 200 mg by mouth at bedtime. 01/18/15   [provider]  pregabalin (LYRICA) 100 MG capsule Take 1 capsule (100 mg total) by mouth 2 (two) times daily. Take 1 tablet daily twice daily. 08/13/22   Nita Sickle K, DO  primidone (MYSOLINE) 50 MG tablet TAKE TWO TABLETS BY MOUTH TWICE A DAY 04/08/22   Patel, Donika K, DO  propranolol ER (INDERAL LA) 120 MG 24 hr capsule Take 1 capsule (120 mg total) by mouth 2 (two) times daily. OK to take extra tablet as needed for worsening tremor. 04/08/22   Glendale Chard, DO  SHINGRIX injection  11/21/20   [provider]  sildenafil (VIAGRA) 100 MG tablet Take 1 tablet (100 mg total) by mouth daily as needed for erectile dysfunction. 03/08/15   Collene Gobble, MD  zolpidem  (AMBIEN) 10 MG tablet Take 10 mg by mouth at bedtime. Takes 12.5 mg at bedtime 07/19/15   [provider]      Allergies    Patient has no known allergies.    Review of Systems   Review of Systems  Physical Exam Updated Vital Signs BP (!) 151/86   Pulse 73   Resp 10   Ht 5\' 10"  (1.778 m)   Wt 89.8 kg   SpO2 97%   BMI 28.41 kg/m  Physical Exam Vitals and nursing note reviewed.  Constitutional:      Appearance: He is well-developed.  HENT:     Head: Normocephalic.     Comments: Occipital hematoma with a small overlying abrasion. Eyes:     Pupils: Pupils are equal, round, and reactive to light.  Neck:     Vascular: No JVD.  Cardiovascular:     Rate and Rhythm: Normal rate and regular rhythm.     Heart sounds: No murmur heard.    No friction rub. No gallop.  Pulmonary:     Effort: No respiratory distress.     Breath sounds: No wheezing.  Abdominal:     General: There is no distension.     Tenderness: There is no abdominal tenderness. There is no guarding or rebound.  Musculoskeletal:  General: Normal range of motion.     Cervical back: Normal range of motion and neck supple.     Comments: Palpated from head to toe without any other obvious noted areas of bony tenderness  Skin:    Coloration: Skin is not pale.     Findings: No rash.  Neurological:     Mental Status: He is alert and oriented to person, place, and time.  Psychiatric:        Behavior: Behavior normal.     ED Results / Procedures / Treatments   Labs (all labs ordered are listed, but only abnormal results are displayed) Labs Reviewed - No data to display  EKG None  Radiology CT Head Wo Contrast  Result Date: 11/10/2022 CLINICAL DATA:  Head neck trauma EXAM: CT HEAD WITHOUT CONTRAST CT CERVICAL SPINE WITHOUT CONTRAST TECHNIQUE: Multidetector CT imaging of the head and cervical spine was performed following the standard protocol without intravenous contrast. Multiplanar CT image  reconstructions of the cervical spine were also generated. RADIATION DOSE REDUCTION: This exam was performed according to the departmental dose-optimization program which includes automated exposure control, adjustment of the mA and/or kV according to patient size and/or use of iterative reconstruction technique. COMPARISON:  07/04/2020 head CT FINDINGS: CT HEAD FINDINGS Brain: No evidence of acute infarction, hemorrhage, hydrocephalus, extra-axial collection or mass lesion/mass effect. Generalized atrophy Vascular: No hyperdense vessel or unexpected calcification. Skull: Right posterior parietal scalp contusion without acute fracture. Sinuses/Orbits: Negative CT CERVICAL SPINE FINDINGS Alignment: No traumatic malalignment Skull base and vertebrae: Base of dens fracture with fracture gap measuring up to 5 mm. The margins appear somewhat matured with fluid gap but some marrow edema by preceding MRI. No subluxation or posterior element fracture is seen. Soft tissues and spinal canal: No prevertebral fluid or swelling. No visible canal hematoma. Disc levels: ACDF at C3 to C6 with solid arthrodesis. Bridging intervertebral osteophyte at C6-7. Facet fusion at T2 to T4 with laminectomy. Upper chest: No acute finding IMPRESSION: Head CT: 1. No evidence of acute intracranial injury. 2. Right parietal scalp hematoma without calvarial fracture. Cervical spine CT: Nonacute but also nonunited base of dens fracture with 5 mm fracture gap. No listhesis. Solid fusion from C3-C7. Electronically Signed   By: Tiburcio Pea M.D.   On: 11/10/2022 05:32   CT Cervical Spine Wo Contrast  Result Date: 11/10/2022 CLINICAL DATA:  Head neck trauma EXAM: CT HEAD WITHOUT CONTRAST CT CERVICAL SPINE WITHOUT CONTRAST TECHNIQUE: Multidetector CT imaging of the head and cervical spine was performed following the standard protocol without intravenous contrast. Multiplanar CT image reconstructions of the cervical spine were also generated.  RADIATION DOSE REDUCTION: This exam was performed according to the departmental dose-optimization program which includes automated exposure control, adjustment of the mA and/or kV according to patient size and/or use of iterative reconstruction technique. COMPARISON:  07/04/2020 head CT FINDINGS: CT HEAD FINDINGS Brain: No evidence of acute infarction, hemorrhage, hydrocephalus, extra-axial collection or mass lesion/mass effect. Generalized atrophy Vascular: No hyperdense vessel or unexpected calcification. Skull: Right posterior parietal scalp contusion without acute fracture. Sinuses/Orbits: Negative CT CERVICAL SPINE FINDINGS Alignment: No traumatic malalignment Skull base and vertebrae: Base of dens fracture with fracture gap measuring up to 5 mm. The margins appear somewhat matured with fluid gap but some marrow edema by preceding MRI. No subluxation or posterior element fracture is seen. Soft tissues and spinal canal: No prevertebral fluid or swelling. No visible canal hematoma. Disc levels: ACDF at C3 to C6 with solid  arthrodesis. Bridging intervertebral osteophyte at C6-7. Facet fusion at T2 to T4 with laminectomy. Upper chest: No acute finding IMPRESSION: Head CT: 1. No evidence of acute intracranial injury. 2. Right parietal scalp hematoma without calvarial fracture. Cervical spine CT: Nonacute but also nonunited base of dens fracture with 5 mm fracture gap. No listhesis. Solid fusion from C3-C7. Electronically Signed   By: Tiburcio Pea M.D.   On: 11/10/2022 05:32    Procedures .Critical Care  Performed by: Melene Plan, DO Authorized by: Melene Plan, DO   Critical care provider statement:    Critical care time (minutes):  35   Critical care time was exclusive of:  Separately billable procedures and treating other patients   Critical care was time spent personally by me on the following activities:  Development of treatment plan with patient or surrogate, discussions with consultants, evaluation  of patient's response to treatment, examination of patient, ordering and review of laboratory studies, ordering and review of radiographic studies, ordering and performing treatments and interventions, pulse oximetry, re-evaluation of patient's condition and review of old charts   Care discussed with: admitting provider       Medications Ordered in ED Medications - No data to display  ED Course/ Medical Decision Making/ A&P                             Medical Decision Making Amount and/or Complexity of Data Reviewed Radiology: ordered.   77 yo M with a chief complaint of a fall.  Nonsyncopal by history.  Complaining of head and neck pain.  Will obtain CT imaging.  CT head without ICH on my independent interpretation.  CT c spine with dens fx. this was seen on a recent MRI.  The patient had told me that he had fallen at least 3 Trager ago and has had trouble ranging his neck since.  I discussed this with neurosurgery.  Recommending c-collar at all times.  Will follow him up in the office.  6:23 AM:  I have discussed the diagnosis/risks/treatment options with the patient.  Evaluation and diagnostic testing in the emergency department does not suggest an emergent condition requiring admission or immediate intervention beyond what has been performed at this time.  They will follow up with PCP. We also discussed returning to the ED immediately if new or worsening sx occur. We discussed the sx which are most concerning (e.g., sudden worsening pain, fever, inability to tolerate by mouth) that necessitate immediate return. Medications administered to the patient during their visit and any new prescriptions provided to the patient are listed below.  Medications given during this visit Medications - No data to display   The patient appears reasonably screen and/or stabilized for discharge and I doubt any other medical condition or other [facility] requiring further screening, evaluation, or treatment in the  ED at this time prior to discharge.          Final Clinical Impression(s) / ED Diagnoses Final diagnoses:  Closed odontoid fracture with nonunion, subsequent encounter    Rx / DC Orders ED Discharge Orders     None         Melene Plan, DO 11/10/22 1660

## 2022-11-11 NOTE — Telephone Encounter (Signed)
Called patient and informed him that Dr. Allena Katz  out of the office. The MRI did not show any abnormalities in the spinal cord. It did show the fracture that was discussed with him in the ER yesterday. Continue with C-collar and follow-up with his spine surgeon.    Patient wanted to see about a referral to [facility] to see Dr. Maisie Fus. He wanted to know Dr. Eliane Decree thoughts about this doctor. He wants a good spine doctor. Patient did state that he will need a referral sent to them if Dr. Allena Katz is ok with this and recommends a doctor there.   Patient is aware that Dr. Allena Katz is out of office and that I would send her this message and contact him back.

## 2022-11-11 NOTE — Telephone Encounter (Signed)
Pls let him know Dr. Allena Katz is out of the office. The MRI did not show any abnormalities in the spinal cord. It did show the fracture that was discussed with him in the ER yesterday. Continue with C-collar and follow-up with his spine surgeon.

## 2022-11-11 NOTE — Telephone Encounter (Signed)
Patient is calling for MRI results/KB

## 2022-11-12 ENCOUNTER — Telehealth: Payer: Self-pay | Admitting: Neurology

## 2022-11-12 NOTE — Telephone Encounter (Signed)
OK to send referral to Dr. Maisie Fus at [facility] for dens fracture and cervical radiculopathy.

## 2022-11-12 NOTE — Telephone Encounter (Signed)
Patient called and LM, he would like to speak to someone about a referral

## 2022-11-13 NOTE — Telephone Encounter (Signed)
Called patient and reassured him that Dr. Maisie Fus is a good neurosurgeon. Patient said that he will contact Dr. Maisie Fus and let us know results. Patient had no further questions or concerns.

## 2022-11-13 NOTE — Telephone Encounter (Signed)
See previous encounter. I have spoken to patient about referral.

## 2022-11-14 IMAGING — MR MR CERVICAL SPINE W/O CM
4 of 5 series · 27 of 48 positions shown · non-contrast
Comparison: CT cervical spine from 3737 and prior MRI cervical
spine from 4444

CLINICAL DATA: Loss of strength in arms and legs and worsening
numbness.

EXAM:
MRI CERVICAL SPINE WITHOUT CONTRAST
TECHNIQUE: Multiplanar, multisequence MR imaging of the cervical spine was
performed. No intravenous contrast was administered.

[Series 9: T2 · sagittal · 3.0mm · 0.55mm/px · 6 of 15 slices shown (1 of 2)]
[im 1/15]
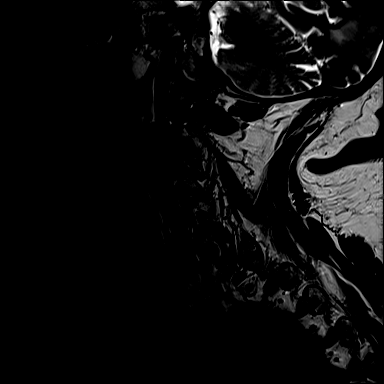
[im 3/15]
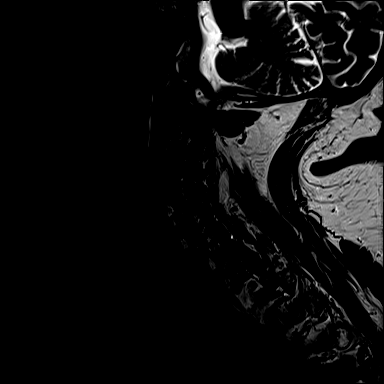
[im 6/15]
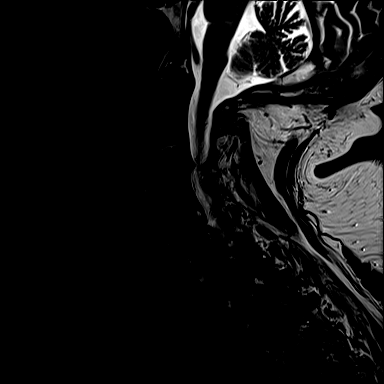
[im 9/15]
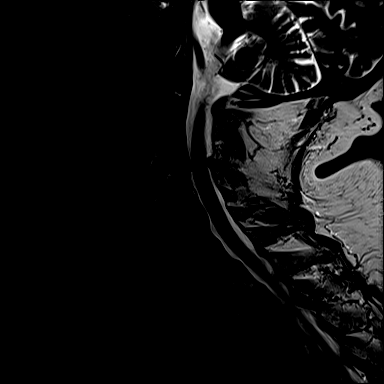
[im 12/15]
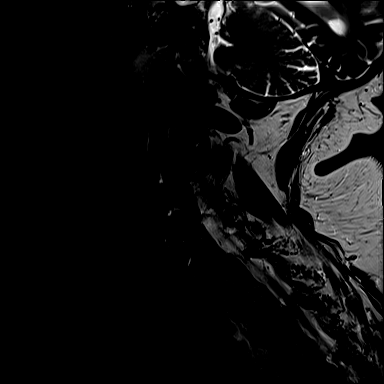
[im 15/15]
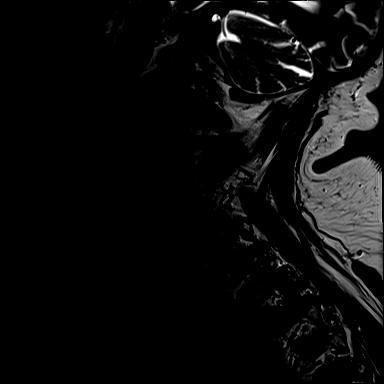

[Series 10: T1 · sagittal · 3.0mm · 0.66mm/px · 7 of 15 slices shown]
[im 1/15]
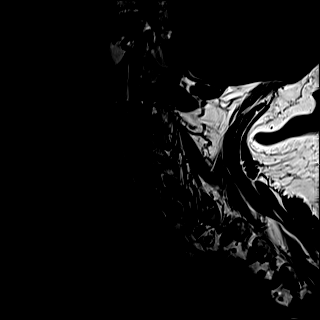
[im 3/15]
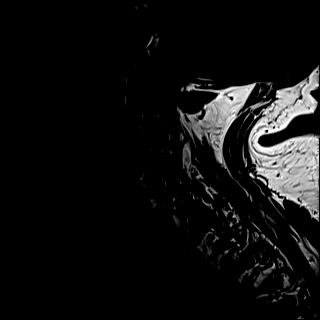
[im 5/15]
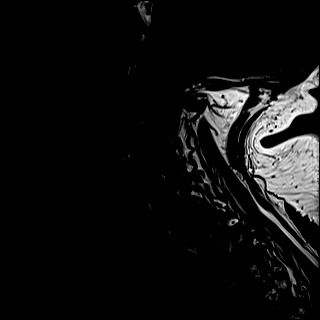
[im 8/15]
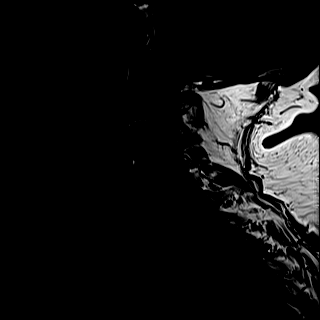
[im 10/15]
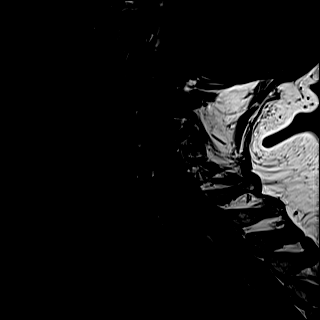
[im 12/15]
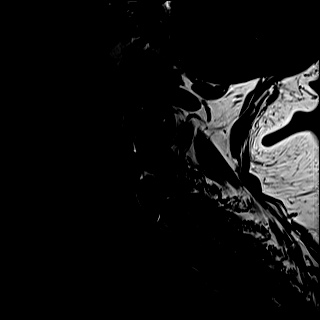
[im 15/15]
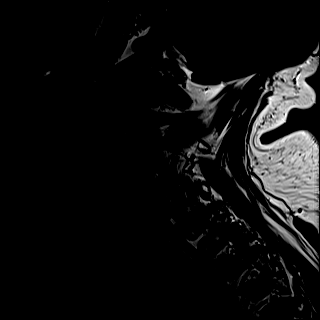

[Series 11: STIR · sagittal · 3.0mm · 0.33mm/px · 6 of 15 slices shown]
[im 1/15]
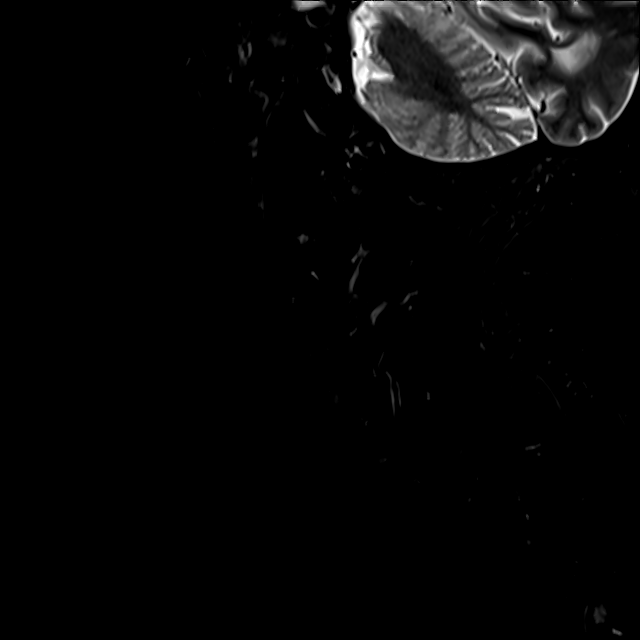
[im 3/15]
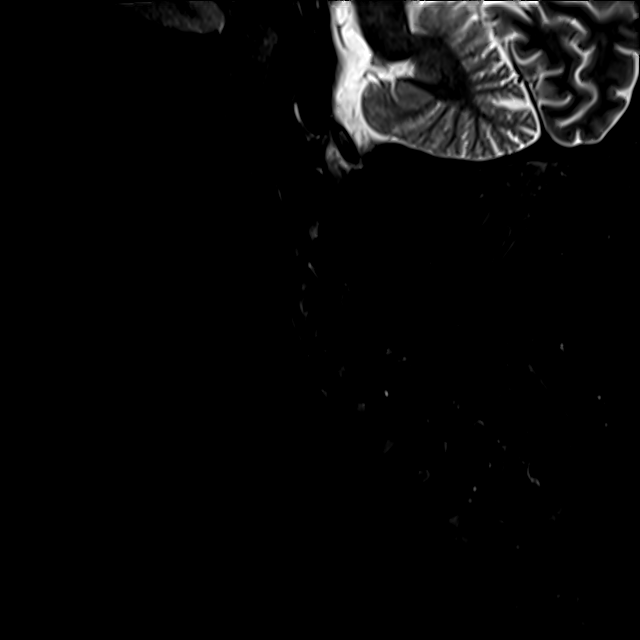
[im 5/15]
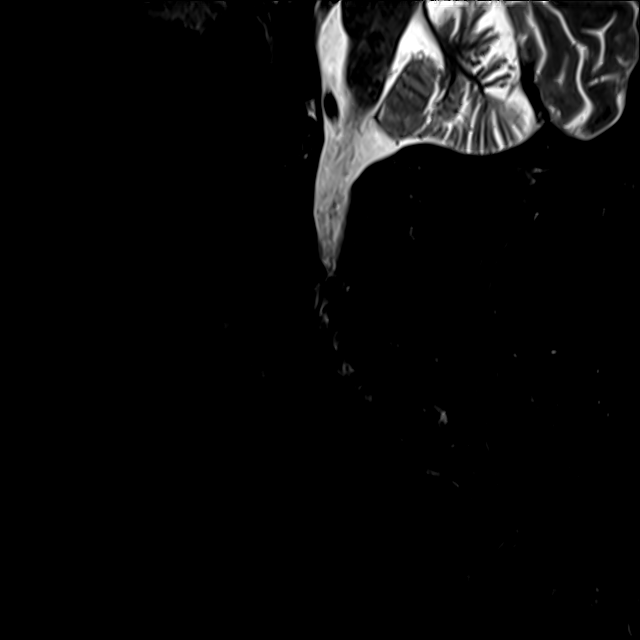
[im 8/15]
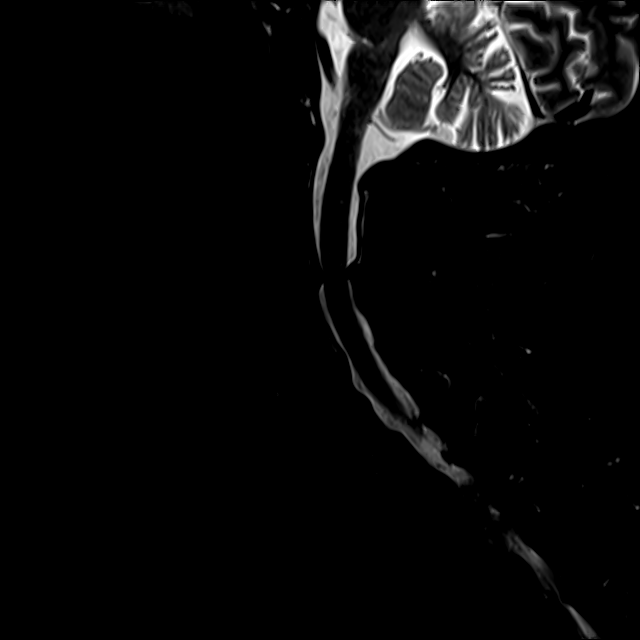
[im 10/15]
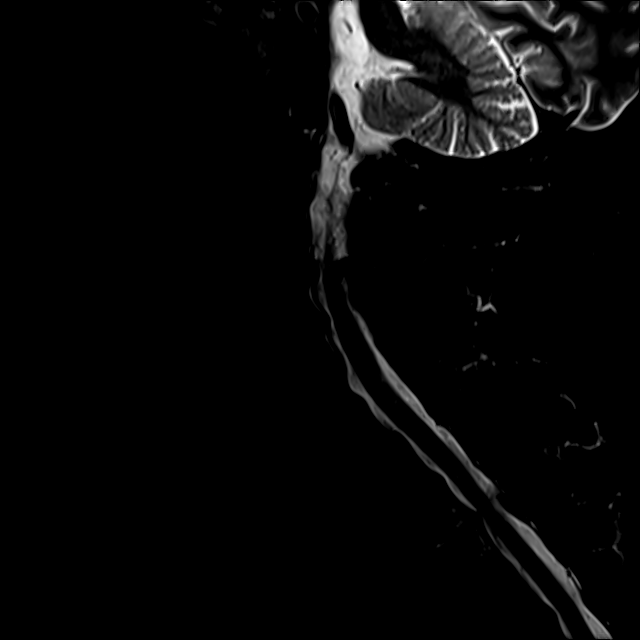
[im 12/15]
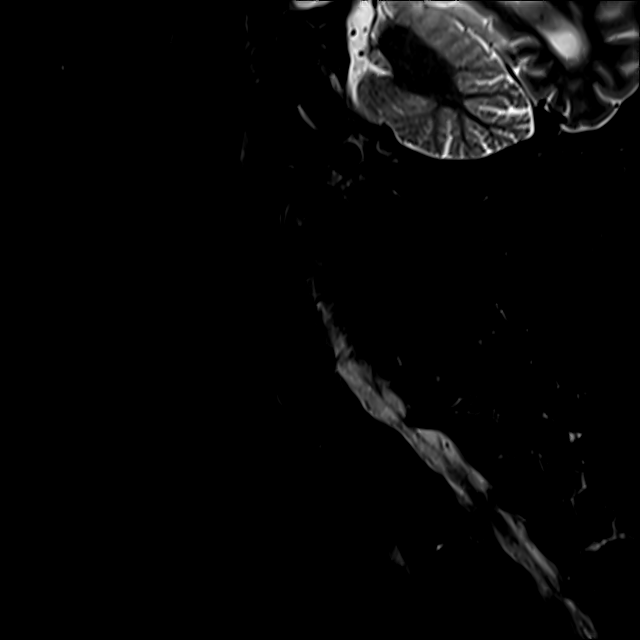

[Series 12: T2 · axial · 3.0mm · 0.50mm/px · z∈[-78,+18]mm · 8 of 32 slices shown (2 of 2)]
[im 1/32]
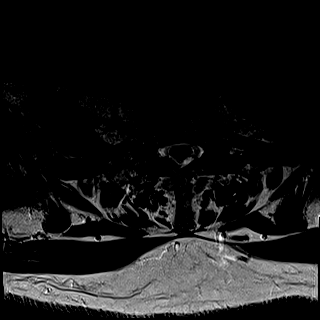
[im 5/32]
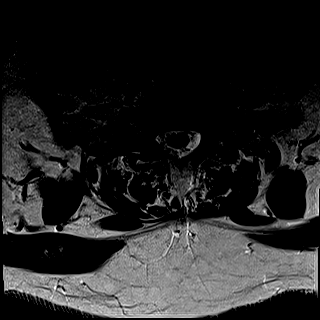
[im 10/32]
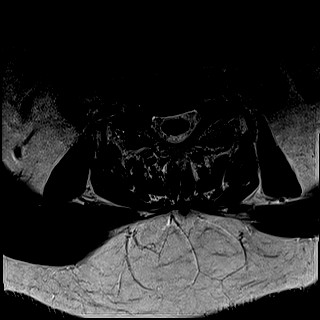
[im 15/32]
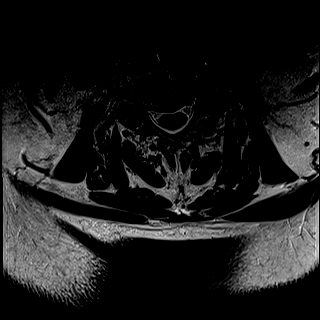
[im 17/32]
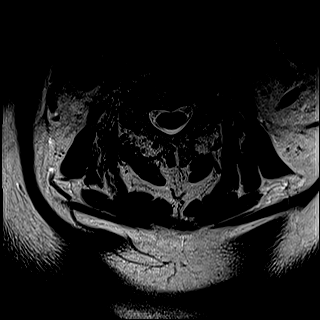
[im 22/32]
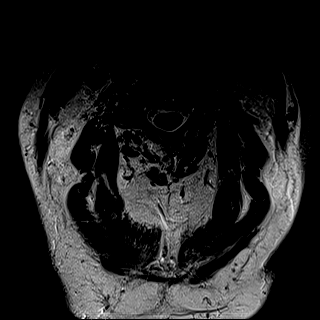
[im 27/32]
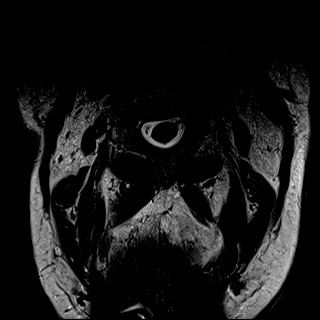
[im 32/32]
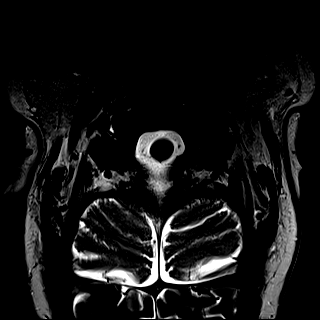

[27 of 48 positions shown; findings below may reference images not displayed]

FINDINGS: Alignment: Normal overall alignment. Stable anterior and interbody
fusion changes at C4-5 and C5-6. No complicating features are
identified.

Vertebrae: Significant degenerative cervical spondylosis and upper
thoracic spondylosis. No acute bony findings or destructive bony
changes. There are endplate reactive changes. Fusion changes are
noted at T3-4 and there is probable partial fusion at T2-3.

Cord: Normal cord signal intensity.  No cord lesions or syrinx.

Posterior Fossa, vertebral arteries, paraspinal tissues: No
significant findings.

Disc levels:

C2-3: No significant findings.

C3-4: Advanced degenerative disc disease with degenerative anterior
subluxation of C4 compared to C3. There is a bulging degenerated
uncovered disc and osteophytic ridging contributing to flattening of
the ventral thecal sac but the spinal canal is fairly generous in is
no significant spinal stenosis. There is mild bilateral foraminal
stenosis, right greater than left.

C4-5: Anterior and interbody fusion changes. No spinal stenosis. No
significant foraminal stenosis.

C5-6: Anterior and interbody fusion changes. Mild osteophytic
ridging posteriorly but no significant spinal or foraminal stenosis.

C6-7: No spinal or foraminal stenosis.

C7-T1: Bulging annulus and osteophytic ridging. No significant
spinal stenosis. Mild right foraminal stenosis.

Moderate canal narrowing at T2-3 due to bulging degenerated annulus,
osteophytic ridging and ligamentum flavum thickening. No definite
cord signal abnormality.
IMPRESSION: 1. Stable anterior and interbody fusion changes at C4-5 and C5-6. No
complicating features.
2. Advanced degenerative disc disease at C3-4 with degenerative
anterior subluxation of C4 compared to C3. There is a bulging
degenerated uncovered disc and osteophytic ridging contributing to
flattening of the ventral thecal sac but no significant spinal
stenosis. There is mild bilateral foraminal stenosis, right greater
than left.
3. Mild right foraminal stenosis at C7-T1.
4. Moderate spinal stenosis at T2-3 based on the sagittal images.
5. No cord lesions or syrinx.

## 2022-11-25 DIAGNOSIS — Z6827 Body mass index (BMI) 27.0-27.9, adult: Secondary | ICD-10-CM | POA: Diagnosis not present

## 2022-11-25 DIAGNOSIS — S12112A Nondisplaced Type II dens fracture, initial encounter for closed fracture: Secondary | ICD-10-CM | POA: Diagnosis not present

## 2022-11-27 DIAGNOSIS — S12100D Unspecified displaced fracture of second cervical vertebra, subsequent encounter for fracture with routine healing: Secondary | ICD-10-CM | POA: Diagnosis not present

## 2022-11-27 DIAGNOSIS — Z6830 Body mass index (BMI) 30.0-30.9, adult: Secondary | ICD-10-CM | POA: Diagnosis not present

## 2022-11-27 DIAGNOSIS — G8929 Other chronic pain: Secondary | ICD-10-CM | POA: Diagnosis not present

## 2022-11-27 DIAGNOSIS — R296 Repeated falls: Secondary | ICD-10-CM | POA: Diagnosis not present

## 2022-11-27 DIAGNOSIS — R2689 Other abnormalities of gait and mobility: Secondary | ICD-10-CM | POA: Diagnosis not present

## 2022-12-01 ENCOUNTER — Emergency Department (HOSPITAL_COMMUNITY): Payer: Medicare HMO

## 2022-12-01 ENCOUNTER — Emergency Department (HOSPITAL_COMMUNITY)
Admission: EM | Admit: 2022-12-01 | Discharge: 2022-12-01 | Disposition: A | Discharge status: Home / Self Care | Payer: Medicare HMO | Attending: Emergency Medicine | Admitting: Emergency Medicine

## 2022-12-01 ENCOUNTER — Other Ambulatory Visit: Payer: Self-pay

## 2022-12-01 ENCOUNTER — Encounter (HOSPITAL_COMMUNITY): Payer: Self-pay

## 2022-12-01 DIAGNOSIS — S51012A Laceration without foreign body of left elbow, initial encounter: Secondary | ICD-10-CM | POA: Insufficient documentation

## 2022-12-01 DIAGNOSIS — S0990XA Unspecified injury of head, initial encounter: Secondary | ICD-10-CM | POA: Diagnosis not present

## 2022-12-01 DIAGNOSIS — S0093XA Contusion of unspecified part of head, initial encounter: Secondary | ICD-10-CM | POA: Insufficient documentation

## 2022-12-01 DIAGNOSIS — Z981 Arthrodesis status: Secondary | ICD-10-CM | POA: Diagnosis not present

## 2022-12-01 DIAGNOSIS — M549 Dorsalgia, unspecified: Secondary | ICD-10-CM | POA: Diagnosis not present

## 2022-12-01 DIAGNOSIS — J449 Chronic obstructive pulmonary disease, unspecified: Secondary | ICD-10-CM | POA: Insufficient documentation

## 2022-12-01 DIAGNOSIS — S299XXA Unspecified injury of thorax, initial encounter: Secondary | ICD-10-CM | POA: Diagnosis not present

## 2022-12-01 DIAGNOSIS — G500 Trigeminal neuralgia: Secondary | ICD-10-CM | POA: Insufficient documentation

## 2022-12-01 DIAGNOSIS — S161XXA Strain of muscle, fascia and tendon at neck level, initial encounter: Secondary | ICD-10-CM | POA: Diagnosis not present

## 2022-12-01 DIAGNOSIS — W01190A Fall on same level from slipping, tripping and stumbling with subsequent striking against furniture, initial encounter: Secondary | ICD-10-CM | POA: Diagnosis not present

## 2022-12-01 DIAGNOSIS — R9431 Abnormal electrocardiogram [ECG] [EKG]: Secondary | ICD-10-CM | POA: Diagnosis not present

## 2022-12-01 DIAGNOSIS — M542 Cervicalgia: Secondary | ICD-10-CM | POA: Diagnosis not present

## 2022-12-01 DIAGNOSIS — M545 Low back pain, unspecified: Secondary | ICD-10-CM | POA: Diagnosis not present

## 2022-12-01 DIAGNOSIS — R0989 Other specified symptoms and signs involving the circulatory and respiratory systems: Secondary | ICD-10-CM | POA: Diagnosis not present

## 2022-12-01 DIAGNOSIS — R251 Tremor, unspecified: Secondary | ICD-10-CM | POA: Diagnosis not present

## 2022-12-01 DIAGNOSIS — S12100K Unspecified displaced fracture of second cervical vertebra, subsequent encounter for fracture with nonunion: Secondary | ICD-10-CM | POA: Diagnosis not present

## 2022-12-01 DIAGNOSIS — W19XXXA Unspecified fall, initial encounter: Secondary | ICD-10-CM

## 2022-12-01 DIAGNOSIS — G709 Myoneural disorder, unspecified: Secondary | ICD-10-CM | POA: Insufficient documentation

## 2022-12-01 LAB — COMPREHENSIVE METABOLIC PANEL
ALT: 24 U/L (ref 0–44)
AST: 40 U/L (ref 15–41)
Albumin: 3.5 g/dL (ref 3.5–5.0)
Alkaline Phosphatase: 69 U/L (ref 38–126)
Anion gap: 8 (ref 5–15)
BUN: 7 mg/dL — ABNORMAL LOW (ref 8–23)
CO2: 27 mmol/L (ref 22–32)
Calcium: 8.8 mg/dL — ABNORMAL LOW (ref 8.9–10.3)
Chloride: 103 mmol/L (ref 98–111)
Creatinine, Ser: 0.76 mg/dL (ref 0.61–1.24)
GFR, Estimated: >60 mL/min (ref >60)
Glucose, Bld: 91 mg/dL (ref 70–99)
Potassium: 4.3 mmol/L (ref 3.5–5.1)
Sodium: 138 mmol/L (ref 135–145)
Total Bilirubin: 0.7 mg/dL (ref 0.3–1.2)
Total Protein: 7.5 g/dL (ref 6.5–8.1)

## 2022-12-01 LAB — CBC
HCT: 41.6 % (ref 39.0–52.0)
Hemoglobin: 14 g/dL (ref 13.0–17.0)
MCH: 34.8 pg — ABNORMAL HIGH (ref 26.0–34.0)
MCHC: 33.7 g/dL (ref 30.0–36.0)
MCV: 103.5 fL — ABNORMAL HIGH (ref 80.0–100.0)
Platelets: 168 10*3/uL (ref 150–400)
RBC: 4.02 MIL/uL — ABNORMAL LOW (ref 4.22–5.81)
RDW: 13.7 % (ref 11.5–15.5)
WBC: 6.8 10*3/uL (ref 4.0–10.5)
nRBC: 0 % (ref 0.0–0.2)

## 2022-12-01 LAB — ETHANOL: Alcohol, Ethyl (B): 150 mg/dL — ABNORMAL HIGH (ref <10)

## 2022-12-01 NOTE — ED Provider Notes (Signed)
Gabriel Richardson Provider Note   CSN: [id number] Arrival date & time: 12/01/22  4098     History  Chief Complaint  Patient presents with   Gabriel Richardson    Gabriel Richardson is a 77 y.o. male.  The history is provided by the patient.   Pt with history of bipolar disorder, COPD, C-spine stenosis, tremor presents after fall. Wife found patient on the floor after he likely hit his head on the table.  She only stepped away from the patient for a brief period of time Patient is unsure why he fell.  Patient has hematoma to the side of the head and small skin tear on his arm. Patient refused c-collar He is not on anticoagulation No LOC is reported   Past Medical History:  Diagnosis Date   Ankle fracture    right with syndesmotic disruption   Anxiety    Arthritis    Asthma    Bipolar disorder (HCC)    Cervical stenosis of spinal canal    Closed fracture of right distal fibula 01/07/2019   COPD (chronic obstructive pulmonary disease) (HCC)    Depression    Emphysema of lung (HCC)    Neuromuscular disorder (HCC)    Trigeminal neuralgia    Wears glasses     Home Medications Prior to Admission medications   Medication Sig Start Date End Date Taking? Authorizing Provider  acetaminophen (TYLENOL) 500 MG tablet Take 1,000 mg by mouth every 6 (six) hours as needed for mild pain or headache.    [provider]  Ketorolac Tromethamine (TORADOL ORAL PO) Take 60 mg by mouth. Takes prn Patient not taking: Reported on 10/15/2022    [provider]  lamoTRIgine (LAMICTAL) 200 MG tablet Take 200 mg by mouth at bedtime. 01/18/15   [provider]  pregabalin (LYRICA) 100 MG capsule Take 1 capsule (100 mg total) by mouth 2 (two) times daily. Take 1 tablet daily twice daily. 08/13/22   Nita Sickle K, DO  primidone (MYSOLINE) 50 MG tablet TAKE TWO TABLETS BY MOUTH TWICE A DAY 04/08/22   Patel, Donika K, DO  propranolol ER (INDERAL LA) 120  MG 24 hr capsule Take 1 capsule (120 mg total) by mouth 2 (two) times daily. OK to take extra tablet as needed for worsening tremor. 04/08/22   Glendale Chard, DO  SHINGRIX injection  11/21/20   [provider]  sildenafil (VIAGRA) 100 MG tablet Take 1 tablet (100 mg total) by mouth daily as needed for erectile dysfunction. 03/08/15   Collene Gobble, MD  zolpidem (AMBIEN) 10 MG tablet Take 10 mg by mouth at bedtime. Takes 12.5 mg at bedtime 07/19/15   [provider]      Allergies    Patient has no known allergies.    Review of Systems   Review of Systems  Neurological:  Positive for headaches.    Physical Exam Updated Vital Signs BP 134/86   Pulse 80   Temp 98.1 F (36.7 C) (Tympanic)   Resp 14   Ht 1.778 m (5\' 10" )   Wt 86.2 kg   SpO2 93%   BMI 27.26 kg/m  Physical Exam CONSTITUTIONAL: Elderly, no acute distress HEAD: Hematoma to the forehead, no signs of trauma EYES: EOMI/PERRL ENMT: Mucous membranes moist SPINE/BACK: Mild cervical spine tenderness, no thoracic or lumbar tenderness No bruising/crepitance/stepoffs noted to spine CV: S1/S2 noted, no murmurs/rubs/gallops noted LUNGS: Lungs are clear to auscultation bilaterally, no apparent  distress ABDOMEN: soft, nontender, no rebound or guarding, bowel sounds noted throughout abdomen GU:no cva tenderness NEURO: Pt is awake/alert, moves all extremitiesx4.  No facial droop.  No arm or leg drift.  Patient appears mildly confused but this is likely his baseline EXTREMITIES: pulses normal/equal, full ROM No deformities to lower extremities.  Full range of motion of both hips without difficulty SKIN: warm, color normal, no lacerations are noted  ED Results / Procedures / Treatments   Labs (all labs ordered are listed, but only abnormal results are displayed) Labs Reviewed  COMPREHENSIVE METABOLIC PANEL - Abnormal; Notable for the following components:      Result Value   BUN 7 (*)    Calcium 8.8 (*)    All  other components within normal limits  CBC - Abnormal; Notable for the following components:   RBC 4.02 (*)    MCV 103.5 (*)    MCH 34.8 (*)    All other components within normal limits  ETHANOL - Abnormal; Notable for the following components:   Alcohol, Ethyl (B) 150 (*)    All other components within normal limits  URINALYSIS, ROUTINE W REFLEX MICROSCOPIC    EKG EKG Interpretation Date/Time:  Sunday December 01 2022 02:38:09 EDT Ventricular Rate:  82 PR Interval:  245 QRS Duration:  103 QT Interval:  389 QTC Calculation: 455 R Axis:   43  Text Interpretation: Sinus rhythm Prolonged PR interval Minimal ST elevation, inferior leads Confirmed by Zadie Rhine ([id number]) on 12/01/2022 2:45:42 AM  Radiology DG Chest Port 1 View  Result Date: 12/01/2022 CLINICAL DATA:  Trauma, fall EXAM: PORTABLE CHEST 1 VIEW COMPARISON:  05/21/2021 FINDINGS: Unchanged cardiac and mediastinal contours when accounting for AP technique. Low lung volumes. No focal pulmonary opacity. No pleural effusion or pneumothorax. No acute osseous abnormality. Prior ACDF. IMPRESSION: No active disease. Electronically Signed   By: Wiliam Ke M.D.   On: 12/01/2022 03:26   CT HEAD WO CONTRAST  Result Date: 12/01/2022 CLINICAL DATA:  Trauma, fall EXAM: CT HEAD WITHOUT CONTRAST CT CERVICAL SPINE WITHOUT CONTRAST TECHNIQUE: Multidetector CT imaging of the head and cervical spine was performed following the standard protocol without intravenous contrast. Multiplanar CT image reconstructions of the cervical spine were also generated. RADIATION DOSE REDUCTION: This exam was performed according to the departmental dose-optimization program which includes automated exposure control, adjustment of the mA and/or kV according to patient size and/or use of iterative reconstruction technique. COMPARISON:  11/10/2022 CT head and cervical spine FINDINGS: CT HEAD FINDINGS Brain: Evaluation of the posterior fossa is limited by beam hardening  artifact related to the patient's dental hardware. Within this limitation, no evidence of acute infarct, hemorrhage, mass, mass effect, or midline shift. No hydrocephalus or extra-axial fluid collection. Periventricular white matter changes, likely the sequela of chronic small vessel ischemic disease. Multiple lipomas along the falx. Vascular: No hyperdense vessel. Skull: Negative for fracture or focal lesion. Sinuses/Orbits: Mucosal thickening in the ethmoid air cells and inferior frontal sinuses. No acute finding in the orbits. Other: The mastoid air cells are well aerated. CT CERVICAL SPINE FINDINGS Alignment: No traumatic listhesis. Skull base and vertebrae: Redemonstrated base of dens fracture, with fracture gap measuring up to 5 mm, unchanged. Status post ACDF C3-C4. No evidence of acute fracture or suspicious osseous lesion. Soft tissues and spinal canal: No prevertebral fluid or swelling. No visible canal hematoma. Disc levels: Solid arthrodesis C3-C6, with ACDF hardware at C3-C4. No high-grade spinal canal stenosis. Upper chest: No focal pulmonary  opacity or pleural effusion. IMPRESSION: 1. No acute intracranial process. 2. No acute fracture or traumatic listhesis in the cervical spine. 3. 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 Electronically Signed   By: Wiliam Ke M.D.   On: 12/01/2022 03:16   CT CERVICAL SPINE WO CONTRAST  Result Date: 12/01/2022 CLINICAL DATA:  Trauma, fall EXAM: CT HEAD WITHOUT CONTRAST CT CERVICAL SPINE WITHOUT CONTRAST TECHNIQUE: Multidetector CT imaging of the head and cervical spine was performed following the standard protocol without intravenous contrast. Multiplanar CT image reconstructions of the cervical spine were also generated. RADIATION DOSE REDUCTION: This exam was performed according to the departmental dose-optimization program which includes automated exposure control, adjustment of the mA and/or kV according to  patient size and/or use of iterative reconstruction technique. COMPARISON:  11/10/2022 CT head and cervical spine FINDINGS: CT HEAD FINDINGS Brain: Evaluation of the posterior fossa is limited by beam hardening artifact related to the patient's dental hardware. Within this limitation, no evidence of acute infarct, hemorrhage, mass, mass effect, or midline shift. No hydrocephalus or extra-axial fluid collection. Periventricular white matter changes, likely the sequela of chronic small vessel ischemic disease. Multiple lipomas along the falx. Vascular: No hyperdense vessel. Skull: Negative for fracture or focal lesion. Sinuses/Orbits: Mucosal thickening in the ethmoid air cells and inferior frontal sinuses. No acute finding in the orbits. Other: The mastoid air cells are well aerated. CT CERVICAL SPINE FINDINGS Alignment: No traumatic listhesis. Skull base and vertebrae: Redemonstrated base of dens fracture, with fracture gap measuring up to 5 mm, unchanged. Status post ACDF C3-C4. No evidence of acute fracture or suspicious osseous lesion. Soft tissues and spinal canal: No prevertebral fluid or swelling. No visible canal hematoma. Disc levels: Solid arthrodesis C3-C6, with ACDF hardware at C3-C4. No high-grade spinal canal stenosis. Upper chest: No focal pulmonary opacity or pleural effusion. IMPRESSION: 1. No acute intracranial process. 2. No acute fracture or traumatic listhesis in the cervical spine. 3. 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 Electronically Signed   By: Wiliam Ke M.D.   On: 12/01/2022 03:16    Procedures Procedures    Medications Ordered in ED Medications - No data to display  ED Course/ Medical Decision Making/ A&P Clinical Course as of 12/01/22 0423  Sun Dec 01, 2022  1610 Patient presents after unwitnessed fall at home.  Patient appears confused but I suspect this is his baseline.  He adamantly refuses to wear a cervical collar.   Will obtain CT head, C-spine and chest x-ray.  No other signs of acute traumatic injury [DW]  0334 No acute traumatic injuries are noted.  Labs are consistent with alcohol intoxication which likely contributed to his fall [DW]  0335 Wife has now arrived.  She reports he has had recent increase in balance issues and he would be amenable to home physical therapy.  They expressed frustration with recent multiple outpatient physician evaluations.  Patient is requesting a refill of his oxycodone prior to his pain management follow-up [DW]  0423 Patient resting comfortably, no acute distress.  He has no new complaints.  On secondary exam, there is no signs of any acute traumatic injuries to his arms, pelvis or legs.  He has no chest or abdominal tenderness. [DW]  0423 Labs reveal patient is intoxicated.  This is likely the cause of his fall as well as his confusion.  Wife is comfortable taking  patient home. [DW]    Clinical Course User Index [DW] Zadie Rhine, MD                             Medical Decision Making Amount and/or Complexity of Data Reviewed Labs: ordered. Radiology: ordered.   This patient presents to the ED for concern of head injury, this involves an extensive number of treatment options, and is a complaint that carries with it a high risk of complications and morbidity.  The differential diagnosis includes but is not limited to concussion, subdural hematoma, subarachnoid hemorrhage, skull fracture  Comorbidities that complicate the patient evaluation: Patient's presentation is complicated by their history of tremor  Social Determinants of Health: Patient's  poor mobility   increases the complexity of managing their presentation  Additional history obtained: Records reviewed  outpatient records reviewed  Lab Tests: I Ordered, and personally interpreted labs.  The pertinent results include: Alcohol intoxication  Imaging Studies ordered: I ordered imaging studies  including CT scan head and C-spine, chest x-ray   I independently visualized and interpreted imaging which showed no acute findings, previous C-spine injury unchanged I agree with the radiologist interpretation   Reevaluation: After the interventions noted above, I reevaluated the patient and found that they have :improved  Complexity of problems addressed: Patient's presentation is most consistent with  acute presentation with potential threat to life or bodily function  Disposition: After consideration of the diagnostic results and the patient's response to treatment,  I feel that the patent would benefit from discharge   .           Final Clinical Impression(s) / ED Diagnoses Final diagnoses:  Fall, initial encounter  Injury of head, initial encounter  Strain of neck muscle, initial encounter    Rx / DC Orders ED Discharge Orders     None         Zadie Rhine, MD 12/01/22 0425

## 2022-12-01 NOTE — ED Triage Notes (Signed)
Arrives EMS from home found on the floor as wife briefly stepped out to the garage.   Denies LOC or anticoagulants.   Hematoma to right side of head and small skin tear on left elbow covered by band aid on arrival.   Had a c-collar applied prior to arrival but pt refused and paramedics agreed on a travel pillow for comfort.

## 2022-12-09 ENCOUNTER — Ambulatory Visit: Payer: Medicare HMO | Admitting: Neurology

## 2022-12-09 ENCOUNTER — Ambulatory Visit
Admission: RE | Admit: 2022-12-09 | Discharge: 2022-12-09 | Disposition: A | Discharge status: Home / Self Care | Payer: Medicare HMO | Source: Ambulatory Visit | Attending: Family Medicine | Admitting: Family Medicine

## 2022-12-09 ENCOUNTER — Telehealth: Payer: Self-pay | Admitting: Neurology

## 2022-12-09 ENCOUNTER — Other Ambulatory Visit: Payer: Self-pay | Admitting: Family Medicine

## 2022-12-09 DIAGNOSIS — M5481 Occipital neuralgia: Secondary | ICD-10-CM | POA: Diagnosis not present

## 2022-12-09 DIAGNOSIS — S0990XA Unspecified injury of head, initial encounter: Secondary | ICD-10-CM

## 2022-12-09 DIAGNOSIS — E559 Vitamin D deficiency, unspecified: Secondary | ICD-10-CM | POA: Diagnosis not present

## 2022-12-09 DIAGNOSIS — R413 Other amnesia: Secondary | ICD-10-CM | POA: Diagnosis not present

## 2022-12-09 DIAGNOSIS — D7589 Other specified diseases of blood and blood-forming organs: Secondary | ICD-10-CM | POA: Diagnosis not present

## 2022-12-09 DIAGNOSIS — R7989 Other specified abnormal findings of blood chemistry: Secondary | ICD-10-CM | POA: Diagnosis not present

## 2022-12-09 DIAGNOSIS — R4189 Other symptoms and signs involving cognitive functions and awareness: Secondary | ICD-10-CM | POA: Diagnosis not present

## 2022-12-09 DIAGNOSIS — R296 Repeated falls: Secondary | ICD-10-CM | POA: Diagnosis not present

## 2022-12-09 DIAGNOSIS — I700 Atherosclerosis of aorta: Secondary | ICD-10-CM | POA: Diagnosis not present

## 2022-12-09 DIAGNOSIS — G319 Degenerative disease of nervous system, unspecified: Secondary | ICD-10-CM | POA: Diagnosis not present

## 2022-12-09 DIAGNOSIS — M542 Cervicalgia: Secondary | ICD-10-CM | POA: Diagnosis not present

## 2022-12-09 NOTE — Telephone Encounter (Signed)
I have not ordered MRI brain on patient and do not see anything in the system.  Are they calling the correct office?  Looks like his PCP ordered CT head which was done today.

## 2022-12-09 NOTE — Telephone Encounter (Signed)
[facility] and informed her that Dr. Allena Katz did not order the MRI Brain. Angelica Chessman just wanted to make sure they weren't missing it and thanked me for the call.

## 2022-12-09 NOTE — Telephone Encounter (Signed)
Patient's primary care office (nurse [facility]) called wanting to know if patient did a MRI of the Brain [phone]

## 2022-12-10 ENCOUNTER — Telehealth: Payer: Self-pay | Admitting: Neurology

## 2022-12-10 DIAGNOSIS — R413 Other amnesia: Secondary | ICD-10-CM

## 2022-12-10 NOTE — Telephone Encounter (Signed)
[facility] [employer] has called wanting to talk to Dr. Allena Katz concerning patient.  Dr. Avel Peace [phone]

## 2022-12-11 NOTE — Telephone Encounter (Signed)
Returned call to Dr. Corliss Blacker.  She had mentioned that patient was having increased memory problems and falls.  At my last visit, he was having headaches and discussed getting MRI brain.  I do not see that he had this, so will plan to get MRI brain wo contrast and have him follow-up with me.

## 2022-12-16 ENCOUNTER — Other Ambulatory Visit: Payer: Self-pay | Admitting: Neurology

## 2022-12-17 DIAGNOSIS — M7918 Myalgia, other site: Secondary | ICD-10-CM | POA: Diagnosis not present

## 2022-12-18 ENCOUNTER — Telehealth: Payer: Self-pay | Admitting: Neurology

## 2022-12-18 MED ORDER — PREGABALIN 100 MG PO CAPS: 100.0000 mg | ORAL_CAPSULE | Freq: Two times a day (BID) | ORAL | 5 refills | Status: DC

## 2022-12-18 NOTE — Telephone Encounter (Signed)
Rx has been sent  

## 2022-12-18 NOTE — Telephone Encounter (Signed)
Lyrica was denied because he should still have refills through September.    I do want him to keep appointment for MRI brain, as the imaging he provided was of the neck.  We can schedule follow-up with me on 8/7.  If there is progressive memory and behavior changes in the meantime, best to go to the ER.

## 2022-12-18 NOTE — Telephone Encounter (Signed)
Pharmacy called no refills on file   Pt is schedule for 8/7 at 10:50  Pt is going to keep his appt for his MRI

## 2022-12-18 NOTE — Telephone Encounter (Signed)
Pt is calling with some concerns with Rx pregabalin (LYRICA) 100 MG pt would like to know if she would like to have him to continue to take this medication and why it was denied at the pharmacy.  Pt is also wanting to know if he is needing to have another MRI done and our office should have a CD of the MRI b/c he brought to the office for Dr. Allena Katz.  Pt would like to have a call back to give him clarification on the above.

## 2022-12-19 DIAGNOSIS — F319 Bipolar disorder, unspecified: Secondary | ICD-10-CM | POA: Diagnosis not present

## 2022-12-23 ENCOUNTER — Encounter: Payer: Self-pay | Admitting: Neurology

## 2022-12-23 DIAGNOSIS — R413 Other amnesia: Secondary | ICD-10-CM | POA: Diagnosis not present

## 2022-12-23 DIAGNOSIS — Z6829 Body mass index (BMI) 29.0-29.9, adult: Secondary | ICD-10-CM | POA: Diagnosis not present

## 2022-12-23 DIAGNOSIS — F3170 Bipolar disorder, currently in remission, most recent episode unspecified: Secondary | ICD-10-CM | POA: Diagnosis not present

## 2022-12-23 DIAGNOSIS — F1090 Alcohol use, unspecified, uncomplicated: Secondary | ICD-10-CM | POA: Diagnosis not present

## 2022-12-23 DIAGNOSIS — D7589 Other specified diseases of blood and blood-forming organs: Secondary | ICD-10-CM | POA: Diagnosis not present

## 2022-12-24 ENCOUNTER — Ambulatory Visit
Admission: RE | Admit: 2022-12-24 | Discharge: 2022-12-24 | Disposition: A | Discharge status: Home / Self Care | Payer: Medicare HMO | Source: Ambulatory Visit | Attending: Neurology | Admitting: Neurology

## 2022-12-24 ENCOUNTER — Other Ambulatory Visit: Payer: Medicare HMO

## 2022-12-24 DIAGNOSIS — G319 Degenerative disease of nervous system, unspecified: Secondary | ICD-10-CM | POA: Diagnosis not present

## 2022-12-24 DIAGNOSIS — R413 Other amnesia: Secondary | ICD-10-CM

## 2022-12-25 ENCOUNTER — Encounter: Payer: Self-pay | Admitting: Neurology

## 2022-12-25 ENCOUNTER — Ambulatory Visit: Payer: Medicare HMO | Admitting: Neurology

## 2022-12-25 VITALS — BP 131/79 | HR 63 | Ht 70.0 in | Wt 193.2 lb

## 2022-12-25 DIAGNOSIS — G8928 Other chronic postprocedural pain: Secondary | ICD-10-CM | POA: Diagnosis not present

## 2022-12-25 DIAGNOSIS — G500 Trigeminal neuralgia: Secondary | ICD-10-CM

## 2022-12-25 DIAGNOSIS — M542 Cervicalgia: Secondary | ICD-10-CM

## 2022-12-25 DIAGNOSIS — R413 Other amnesia: Secondary | ICD-10-CM

## 2022-12-25 DIAGNOSIS — Z9889 Other specified postprocedural states: Secondary | ICD-10-CM | POA: Diagnosis not present

## 2022-12-25 DIAGNOSIS — G250 Essential tremor: Secondary | ICD-10-CM

## 2022-12-25 NOTE — Patient Instructions (Signed)
We will request your labs from your primary care doctors office  Your MRI brain results will be shared with you once they are finalized  Recommend that you cut back on alcohol consumption

## 2022-12-25 NOTE — Progress Notes (Unsigned)
Follow-up Visit   Date: 12/25/22    Gabriel Richardson MRN: [id number] DOB: 15-Apr-1946   Interim History: Gabriel Richardson is a 77 y.o. right-handed Caucasian male with history of depression, asthma, and hypertension here with new cognitive complaints.  He is seen here for essential tremor and trigeminal neuralgia.  The patient was accompanied to the clinic by self.  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  There was poor effort on cognitive screening assessment. Patient was here alone today, but based on conversations with his PCP and pain management there is concern of subacute worsening of behavior.  Patient denies this and family is not present to collaborate his history.   From a cognitive standpoint, I recommend checking labs for TSH, vitamin B12, folate, and vitamin B1.  However, he reports having blood work done recently at [facility] office and does not want to have anything repeated.  I will request these records.  He also has MRI brain this week, however, the results are not finalized.  Medication for memory, such as Aricept was declined by patient.  He does not wish to pursue additional testing for memory.  I strongly urged him to try to cut back on alcohol as this can effect cognitive health, but also have drug-drug interactions with his medications.   Benign essential tremor, stable  - Continue primidone 100mg  twice daily  - Continue propranolol 120mg  twice daily.  OK to take an extra dose as needed  2.  Right trigeminal neuralgia with exacerbation of pain in January.  His last flare was about 10-15 years ago. Pain is controlled on Lyrica.   - Previously tried:  Gabapentin, tegretol  - Continue Lyrica 100mg  twice daily  - He takes lamictal 200mg  at bedtime as a mood stabilizer prescribed by Dr. Donell Beers  3.  Cervical canal stenosis s/p ACDF C3-4, C4-5, and C5-6 with new neck pain.  He is mostly bothered  by his neck pain and reports having the greatest benefit with ESI.  I suggest that he talk to his provider to have this again  Return to clinic in 3 months   --------------------------------------------------------- History of present illness: Initial visit 04/30/2013:  He had previously been a patient of Dr. Sandria Manly since 1995, initially for right trigeminal neuralgia which he had frequent flares of since 1997 - early 2000. He has tried trileptal, neurontin, and lyrica and had the most relief with Lyrica.  He has not had a flare since 2000, until 2016, and again in 2024.    Around 2012, he started developing muscle cramps and left hand tremors and was concerned about multiple sclerosis. Although there was white matter changes on his MRI, Dr. Sandria Manly did not find that his exam and imaging was consistent with demyelinating disease. He also had CSF testing in January 2014 which did not show any inflammatory changes. He treated him symptomatically and was started on propranolol 60mg  which has helped with his tremors.   He is s/p anterior cervical fusion by Dr. Trey Sailors in 2010 and has history of left L5 radiculopathy status post L5-S1 laminectomy.  He saw Dr. Hosie Poisson in July 2016 for new left ptosis.  Myasthenia antibodies were checked and was normal.  He declined to have additional testing done to evaluate this.  Starting in 2018, his hand tremors started getting worse.  His propranolol was steadily increased to 220/d without benefit, so primidone was added and titrated to 150mg /d.  UPDATE  04/08/2022:  He is here for follow-up visit.  Tremors are doing very well on primidone 100mg  BID and propranolol 120mg  BID.  He rarely takes an extra dose of propranolol.  No interval hospitalizations, illnesses, or falls.  He uses a cane for support due to low back pain, for which he gets ESI.  No new neurological complaints.   UPDATE 08/13/2022:  He is here for sooner than scheduled visit due to recurrence of right  trigeminal neuralgia.  He started having severe right sided facial pain and was started on Lyrica 100mg  twice daily.  Pain is relieved with Lyrica and he no longer has facial pain.  He has stopped using hydrocodone for low back pain since starting Lyrica.  Tremors remain stable on primidone 100mg  BID and propranolol 120mg  BID.   UPDATE 10/15/2022:  He had two falls about a month ago and since this time he has neck stiffness, unable to rotate his head and has severe pain.  He notices that he is lightheaded when he tries to standup.  He complains of left sided head which is new.   Trigeminal neuralgia is much better controlled on Lyrica 100mg  twice daily.  He continues to have tremors of the hands which are stable, he has good days and bad days.  He is compliant with primidone 100mg  BID and propranolol 120mg  BID.   UPDATE 12/25/2022:  He is here for sooner follow-up visit due to concern of worsening cognition. He is here alone.  Patient has noticed that his memory is getting worse.  He sometimes forgets peoples names and details of conversations. He has difficulty recalling day-to-day details, but can remember things from years ago easily. He feels that it is part of normal aging.  His wife helps with medication and he has an Geophysicist/field seismologist that manages finances.  He does not drink.  He denies behavior changes. His primary concern and frustration is lack of pain relief for his neck pain.  He is seeing pain management and takes oxycodone 10mg  twice daily.  He does not feel that it is enough to control pain and tells me that his greatest benefit was with getting ESI.  Pain management physician contacted me stating that his wife indicated concerns of patient wandering and agitated in the evening.  PCP also mentioned concerned of his alcohol use.  He tells me that he drinks 2 manhattan's daily.    Medications:  Current Outpatient Medications on File Prior to Visit  Medication Sig Dispense Refill   acetaminophen (TYLENOL)  500 MG tablet Take 1,000 mg by mouth every 6 (six) hours as needed for mild pain or headache.     cyclobenzaprine (FLEXERIL) 10 MG tablet Take 10 mg by mouth 3 (three) times daily.     lamoTRIgine (LAMICTAL) 200 MG tablet Take 200 mg by mouth at bedtime.  1   oxyCODONE-acetaminophen (PERCOCET) 10-325 MG tablet Take 1 tablet by mouth 2 (two) times daily as needed.     pregabalin (LYRICA) 100 MG capsule Take 1 capsule (100 mg total) by mouth 2 (two) times daily. Take 1 tablet daily twice daily. 60 capsule 5   primidone (MYSOLINE) 50 MG tablet TAKE TWO TABLETS BY MOUTH TWICE A DAY 360 tablet 3   propranolol ER (INDERAL LA) 120 MG 24 hr capsule Take 1 capsule (120 mg total) by mouth 2 (two) times daily. OK to take extra tablet as needed for worsening tremor. 200 capsule 3   sildenafil (VIAGRA) 100 MG tablet Take 1 tablet (100  mg total) by mouth daily as needed for erectile dysfunction. 12 tablet 11   zolpidem (AMBIEN) 10 MG tablet Take 10 mg by mouth at bedtime. Takes 12.5 mg at bedtime  1   No current facility-administered medications on file prior to visit.    Allergies: No Known Allergies  Vital Signs:  BP 131/79   Pulse 63   Ht 5\' 10"  (1.778 m)   Wt 193 lb 3.2 oz (87.6 kg)   SpO2 99%   BMI 27.72 kg/m    Neurological Exam: MENTAL STATUS including orientation to year, month, day (missed date by one day). Normal attention span and concentration, language, and fund of knowledge is normal.  Speech is not dysarthric.  CRANIAL NERVES:  Pupils equal round and reactive to light.  Normal conjugate, extra-ocular eye movements in all directions of gaze.  Mild left ptosis (old).  Face is symmetric.   MOTOR:  Motor strength is 5/5 in all extremities. Mild postural tremor on the right > left hand.  No atrophy, fasciculations or abnormal movements.  No pronator drift.  Tone is normal.    MSRs:                                           Right        Left brachioradialis 2+  2+  biceps 2+  2+   triceps 2+  2+  patellar 2+  0  ankle jerk 0  0   COORDINATION/GAIT:  Normal finger-to- nose-finger.  Intact rapid alternating movements bilaterally.  Gait mildly wide-based and stable, assited with cane  Data: AChR blocking and binding 11/02/2012 - neg  CSF 06/02/2012:  R0  W1  G63  P43  IgG index 0.43   OCB -neg  MRI brain wwo contrast 07/08/2009: 1. Advanced white matter disease in a pattern compatible with multiple sclerosis. Mild progression since 2004. No areas of   enhancement.   2. No acute intracranial abnormality.  MRI thoracic and lumbar spine 03/16/2021: 1. Postsurgical changes from laminectomy at the T2-3 level with interval resolution of spinal canal stenosis. Improvement of the neural foraminal narrowing at this level, now mild bilaterally. 2. Normal appearance of the thoracic spinal cord on T2 weighted images with heterogeneous signal on post contrast images in the cord from the C6 through the T2 level which is favored to represent artifact rather than contrast enhancement. However, pathological contrast enhancement cannot be excluded. In case of high clinical concern for cord lesion at this level, repeat images are recommended. 3. Acute/subacute compression fracture of the superior endplate of L2 with approximately 30% loss of vertebral body height without retropulsion. 4. Degenerative changes and anterolisthesis at L5-S1 resulting in severe narrowing of the left subarticular zone and severe left neural foraminal narrowing. 5. 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  MRI cervical spine wo contrast  03/16/2021: 1. Interval ACDF of C3-4 with slight retrolisthesis of C3 on C4. Mild canal stenosis at this level has slightly improved from prior. 2. Prior ACDF at C4-5 and C5-6 with solid arthrodesis. No residual canal stenosis. 3. Stable degenerative changes of the cervical spine  including borderline-mild canal stenosis at C7-T1 with moderate left and mild right foraminal stenosis. 4. Diffuse marrow heterogeneity without discrete marrow replacing lesion. Findings are nonspecific but can be seen in the setting of chronic  anemia, smoking, and/or obesity.   Total time spent reviewing records, interview, history/exam, documentation, and coordination of care on day of encounter:  40 min   Thank you for allowing me to participate in patient's care.  If I can answer any additional questions, I would be pleased to do so.    Sincerely,    Abdulaziz Toman K. Allena Katz, DO

## 2022-12-28 ENCOUNTER — Other Ambulatory Visit: Payer: Medicare HMO

## 2022-12-31 DIAGNOSIS — M47812 Spondylosis without myelopathy or radiculopathy, cervical region: Secondary | ICD-10-CM | POA: Diagnosis not present

## 2022-12-31 DIAGNOSIS — Z79891 Long term (current) use of opiate analgesic: Secondary | ICD-10-CM | POA: Diagnosis not present

## 2022-12-31 DIAGNOSIS — Z981 Arthrodesis status: Secondary | ICD-10-CM | POA: Diagnosis not present

## 2022-12-31 DIAGNOSIS — M47816 Spondylosis without myelopathy or radiculopathy, lumbar region: Secondary | ICD-10-CM | POA: Diagnosis not present

## 2023-01-02 NOTE — Progress Notes (Signed)
Addendum  Labs rec'd from PCP's office 12/23/2022:  vitamin B1 170, CMP normal, TSH 1.94, vitamin D 72.9, folate 18.2, vitmain B12 639

## 2023-01-03 ENCOUNTER — Telehealth: Payer: Self-pay | Admitting: Neurology

## 2023-01-03 MED ORDER — MEMANTINE HCL 10 MG PO TABS: ORAL_TABLET | ORAL | 3 refills | Status: DC

## 2023-01-03 NOTE — Telephone Encounter (Signed)
Patient called stating that he would like the medication Memantine

## 2023-01-03 NOTE — Telephone Encounter (Signed)
Called pateint an discussed the med being sent in for him

## 2023-01-03 NOTE — Telephone Encounter (Signed)
Rx sent to [facility]

## 2023-01-13 DIAGNOSIS — H2512 Age-related nuclear cataract, left eye: Secondary | ICD-10-CM | POA: Diagnosis not present

## 2023-01-13 DIAGNOSIS — H2513 Age-related nuclear cataract, bilateral: Secondary | ICD-10-CM | POA: Diagnosis not present

## 2023-01-14 DIAGNOSIS — H2511 Age-related nuclear cataract, right eye: Secondary | ICD-10-CM | POA: Diagnosis not present

## 2023-01-19 ENCOUNTER — Encounter (HOSPITAL_BASED_OUTPATIENT_CLINIC_OR_DEPARTMENT_OTHER): Payer: Self-pay

## 2023-01-19 ENCOUNTER — Other Ambulatory Visit: Payer: Self-pay

## 2023-01-19 ENCOUNTER — Emergency Department (HOSPITAL_BASED_OUTPATIENT_CLINIC_OR_DEPARTMENT_OTHER)
Admission: EM | Admit: 2023-01-19 | Discharge: 2023-01-19 | Disposition: A | Discharge status: Home / Self Care | Payer: Medicare HMO | Source: Home / Self Care | Attending: Emergency Medicine | Admitting: Emergency Medicine

## 2023-01-19 ENCOUNTER — Emergency Department (HOSPITAL_BASED_OUTPATIENT_CLINIC_OR_DEPARTMENT_OTHER): Payer: Medicare HMO

## 2023-01-19 DIAGNOSIS — S12000A Unspecified displaced fracture of first cervical vertebra, initial encounter for closed fracture: Secondary | ICD-10-CM | POA: Insufficient documentation

## 2023-01-19 DIAGNOSIS — F10129 Alcohol abuse with intoxication, unspecified: Secondary | ICD-10-CM | POA: Diagnosis not present

## 2023-01-19 DIAGNOSIS — S12121G Other nondisplaced dens fracture, subsequent encounter for fracture with delayed healing: Secondary | ICD-10-CM | POA: Diagnosis not present

## 2023-01-19 DIAGNOSIS — R9431 Abnormal electrocardiogram [ECG] [EKG]: Secondary | ICD-10-CM | POA: Diagnosis not present

## 2023-01-19 DIAGNOSIS — F10229 Alcohol dependence with intoxication, unspecified: Secondary | ICD-10-CM | POA: Diagnosis not present

## 2023-01-19 DIAGNOSIS — W19XXXA Unspecified fall, initial encounter: Secondary | ICD-10-CM | POA: Diagnosis not present

## 2023-01-19 DIAGNOSIS — S12200K Unspecified displaced fracture of third cervical vertebra, subsequent encounter for fracture with nonunion: Secondary | ICD-10-CM | POA: Diagnosis not present

## 2023-01-19 DIAGNOSIS — S0101XA Laceration without foreign body of scalp, initial encounter: Secondary | ICD-10-CM | POA: Insufficient documentation

## 2023-01-19 DIAGNOSIS — S12100G Unspecified displaced fracture of second cervical vertebra, subsequent encounter for fracture with delayed healing: Secondary | ICD-10-CM

## 2023-01-19 DIAGNOSIS — S12090A Other displaced fracture of first cervical vertebra, initial encounter for closed fracture: Secondary | ICD-10-CM | POA: Diagnosis not present

## 2023-01-19 DIAGNOSIS — S0990XA Unspecified injury of head, initial encounter: Secondary | ICD-10-CM

## 2023-01-19 DIAGNOSIS — S12000K Unspecified displaced fracture of first cervical vertebra, subsequent encounter for fracture with nonunion: Secondary | ICD-10-CM | POA: Diagnosis not present

## 2023-01-19 DIAGNOSIS — F10920 Alcohol use, unspecified with intoxication, uncomplicated: Secondary | ICD-10-CM

## 2023-01-19 DIAGNOSIS — S12101K Unspecified nondisplaced fracture of second cervical vertebra, subsequent encounter for fracture with nonunion: Secondary | ICD-10-CM | POA: Diagnosis not present

## 2023-01-19 DIAGNOSIS — M4801 Spinal stenosis, occipito-atlanto-axial region: Secondary | ICD-10-CM | POA: Diagnosis not present

## 2023-01-19 DIAGNOSIS — R58 Hemorrhage, not elsewhere classified: Secondary | ICD-10-CM | POA: Diagnosis not present

## 2023-01-19 LAB — COMPREHENSIVE METABOLIC PANEL
ALT: 29 U/L (ref 0–44)
AST: 31 U/L (ref 15–41)
Albumin: 3.9 g/dL (ref 3.5–5.0)
Alkaline Phosphatase: 67 U/L (ref 38–126)
Anion gap: 9 (ref 5–15)
BUN: 11 mg/dL (ref 8–23)
CO2: 29 mmol/L (ref 22–32)
Calcium: 8.6 mg/dL — ABNORMAL LOW (ref 8.9–10.3)
Chloride: 101 mmol/L (ref 98–111)
Creatinine, Ser: 0.7 mg/dL (ref 0.61–1.24)
GFR, Estimated: >60 mL/min (ref >60)
Glucose, Bld: 98 mg/dL (ref 70–99)
Potassium: 3.6 mmol/L (ref 3.5–5.1)
Sodium: 139 mmol/L (ref 135–145)
Total Bilirubin: 0.3 mg/dL (ref 0.3–1.2)
Total Protein: 7.2 g/dL (ref 6.5–8.1)

## 2023-01-19 LAB — CBC WITH DIFFERENTIAL/PLATELET
Abs Immature Granulocytes: 0.02 10*3/uL (ref 0.00–0.07)
Basophils Absolute: 0 10*3/uL (ref 0.0–0.1)
Basophils Relative: 1 %
Eosinophils Absolute: 0.1 10*3/uL (ref 0.0–0.5)
Eosinophils Relative: 1 %
HCT: 41.4 % (ref 39.0–52.0)
Hemoglobin: 14.5 g/dL (ref 13.0–17.0)
Immature Granulocytes: 0 %
Lymphocytes Relative: 39 %
Lymphs Abs: 2.4 10*3/uL (ref 0.7–4.0)
MCH: 35.5 pg — ABNORMAL HIGH (ref 26.0–34.0)
MCHC: 35 g/dL (ref 30.0–36.0)
MCV: 101.5 fL — ABNORMAL HIGH (ref 80.0–100.0)
Monocytes Absolute: 0.4 10*3/uL (ref 0.1–1.0)
Monocytes Relative: 7 %
Neutro Abs: 3.3 10*3/uL (ref 1.7–7.7)
Neutrophils Relative %: 52 %
Platelets: 186 10*3/uL (ref 150–400)
RBC: 4.08 MIL/uL — ABNORMAL LOW (ref 4.22–5.81)
RDW: 13.1 % (ref 11.5–15.5)
WBC: 6.3 10*3/uL (ref 4.0–10.5)
nRBC: 0 % (ref 0.0–0.2)

## 2023-01-19 LAB — ETHANOL: Alcohol, Ethyl (B): 144 mg/dL — ABNORMAL HIGH (ref <10)

## 2023-01-19 MED ORDER — LIDOCAINE-EPINEPHRINE (PF) 2 %-1:200000 IJ SOLN
10.0000 mL | Freq: Once | INTRAMUSCULAR | Status: AC
  Administered 2023-01-19: 10 mL
  Filled 2023-01-19: qty 20

## 2023-01-19 NOTE — ED Provider Notes (Signed)
[facility] DEPARTMENT AT [facility]  Provider Note  CSN: [id number] Arrival date & time: 01/19/23 0428  History Chief Complaint  Patient presents with   Gabriel Richardson is a 77 y.o. male brought by EMS from home after a fall. Patient unable to tell me what happened or how he got here. He has had several recent falls, often with EtOH contributing. He is also prescribed Ambien and Oxycodone. No known anticoagulation.Had a recent odontoid fracture. Not wearing C-collar as advised.   Home Medications Prior to Admission medications   Medication Sig Start Date End Date Taking? Authorizing Provider  acetaminophen (TYLENOL) 500 MG tablet Take 1,000 mg by mouth every 6 (six) hours as needed for mild pain or headache.    [provider]  cyclobenzaprine (FLEXERIL) 10 MG tablet Take 10 mg by mouth 3 (three) times daily. 12/17/22   [provider]  lamoTRIgine (LAMICTAL) 200 MG tablet Take 200 mg by mouth at bedtime. 01/18/15   [provider]  memantine (NAMENDA) 10 MG tablet Take half tablet daily x 1 week, then take half tablet twice daily x 1 week, then half tablet in the morning and 1 tablet at bedtime x 1 week, then continue 1 tablet twice daily. 01/03/23   Nita Sickle K, DO  oxyCODONE-acetaminophen (PERCOCET) 10-325 MG tablet Take 1 tablet by mouth 2 (two) times daily as needed. 12/08/22   [provider]  pregabalin (LYRICA) 100 MG capsule Take 1 capsule (100 mg total) by mouth 2 (two) times daily. Take 1 tablet daily twice daily. 12/18/22   Nita Sickle K, DO  primidone (MYSOLINE) 50 MG tablet TAKE TWO TABLETS BY MOUTH TWICE A DAY 04/08/22   Patel, Donika K, DO  propranolol ER (INDERAL LA) 120 MG 24 hr capsule Take 1 capsule (120 mg total) by mouth 2 (two) times daily. OK to take extra tablet as needed for worsening tremor. 04/08/22   Nita Sickle K, DO  sildenafil (VIAGRA) 100 MG tablet Take 1 tablet (100 mg total) by mouth daily as  needed for erectile dysfunction. 03/08/15   Collene Gobble, MD  zolpidem (AMBIEN) 10 MG tablet Take 10 mg by mouth at bedtime. Takes 12.5 mg at bedtime 07/19/15   [provider]     Allergies    Patient has no known allergies.   Review of Systems   Review of Systems Please see HPI for pertinent positives and negatives  Physical Exam BP (!) 143/79   Pulse 82   Resp 15   SpO2 97%   Physical Exam Vitals and nursing note reviewed.  HENT:     Head: Normocephalic.     Comments: 3cm laceration to L occipital scalp    Nose: Nose normal.     Mouth/Throat:     Mouth: Mucous membranes are moist.  Eyes:     Extraocular Movements: Extraocular movements intact.     Conjunctiva/sclera: Conjunctivae normal.  Cardiovascular:     Rate and Rhythm: Normal rate.  Pulmonary:     Effort: Pulmonary effort is normal.     Breath sounds: Normal breath sounds.  Abdominal:     General: Abdomen is flat.     Palpations: Abdomen is soft.     Tenderness: There is no abdominal tenderness.  Musculoskeletal:        General: No swelling, tenderness or deformity. Normal range of motion.     Cervical back: Neck supple. No tenderness.  Skin:  General: Skin is warm and dry.     Findings: No rash (on exposed skin).  Neurological:     General: No focal deficit present.     Mental Status: He is alert and oriented to person, place, and time.     Cranial Nerves: No cranial nerve deficit.     Sensory: No sensory deficit.     Motor: No weakness.     Comments: Intoxicated  Psychiatric:        Mood and Affect: Mood normal.     ED Results / Procedures / Treatments   EKG EKG Interpretation Date/Time:  Sunday January 19 2023 04:42:53 EDT Ventricular Rate:  85 PR Interval:  212 QRS Duration:  102 QT Interval:  372 QTC Calculation: 443 R Axis:   40  Text Interpretation: Sinus rhythm Borderline prolonged PR interval Consider anterior infarct Minimal ST elevation, inferior leads No significant  change since last tracing Confirmed by Susy Frizzle [phone]) on 01/19/2023 4:52:47 AM  Procedures .Marland KitchenLaceration Repair  Date/Time: 01/19/2023 6:22 AM  Performed by: Pollyann Savoy, MD Authorized by: Pollyann Savoy, MD   Consent:    Consent obtained:  Verbal   Consent given by:  Patient Anesthesia:    Anesthesia method:  Local infiltration   Local anesthetic:  Lidocaine 2% WITH epi Laceration details:    Location:  Scalp   Scalp location:  Occipital   Length (cm):  3 Treatment:    Area cleansed with:  Saline Skin repair:    Repair method:  Staples   Number of staples:  3 Approximation:    Approximation:  Close Repair type:    Repair type:  Simple Post-procedure details:    Dressing:  Open (no dressing)   Procedure completion:  Tolerated well, no immediate complications   Medications Ordered in the ED Medications  lidocaine-EPINEPHrine (XYLOCAINE W/EPI) 2 %-1:200000 (PF) injection 10 mL (10 mLs Infiltration Given 01/19/23 0558)    Initial Impression and Plan  Patient here after fall of unknown circumstances, patient appears intoxicated. Admits to one drink of EtOH at 1730hrs and none since then. He is also on Ambien at night and Oxycodone/APAP 10/325 BID. Will check labs, EKG and CT. Patient's wife reportedly enroute.   ED Course   Clinical Course as of 01/19/23 0648  Wynelle Link Jan 19, 2023  0458 CBC is unremarkable. [CS]  0511 EtOH is elevated above level expected for his reported use 12 hours prior to arrival.  [CS]  0514 CMP is unremarkable.  [CS]  [phone] I personally viewed the images from radiology studies and agree with radiologist interpretation: CT head is neg for ICH, C-spine shows old C2 fracture as well as C1 fractures, not on previous C-spine imaging 7/14, but were on CT head 7/22, does not appear acute.  He had Neurosurg follow up after prior fall in mid-July, Wife at beside now, does not think he was told to wear a c-collar. She reports patient drinks alcohol  to excess every day.  [CS]  [phone] Spoke with Esperanza Richters, Neurosurgery, who is familiar with the patient. He HAS been told to wear a c-collar but refuses. I spoke with him and his wife at bedside and stressed he importance of wearing the collar, provided Aspen collar here. Encouraged to stop drinking alcohol as this is undoubtedly causing his falls. He understands that any future fall could result in paralysis or death given the location of his fractures. Otherwise he has no indication for admission or emergent surgery. Plan discharge with  wound care instructions and PCP follow up for staple removal.  [CS]    Clinical Course User Index [CS] Pollyann Savoy, MD     MDM Rules/Calculators/A&P Medical Decision Making Problems Addressed: Alcoholic intoxication without complication [facility]): chronic illness or injury with exacerbation, progression, or side effects of treatment Closed displaced fracture of first cervical vertebra, unspecified fracture morphology, initial encounter [facility]): chronic illness or injury with exacerbation, progression, or side effects of treatment Closed odontoid fracture with delayed healing, subsequent encounter: chronic illness or injury with exacerbation, progression, or side effects of treatment Fall, initial encounter: acute illness or injury Injury of head, initial encounter: acute illness or injury Laceration of scalp, initial encounter: acute illness or injury  Amount and/or Complexity of Data Reviewed Labs: ordered. Decision-making details documented in ED Course. Radiology: ordered and independent interpretation performed. Decision-making details documented in ED Course. ECG/medicine tests: ordered and independent interpretation performed. Decision-making details documented in ED Course.  Risk Prescription drug management. Decision regarding hospitalization.     Final Clinical Impression(s) / ED Diagnoses Final diagnoses:  Fall, initial encounter  Alcoholic  intoxication without complication (HCC)  Laceration of scalp, initial encounter  Injury of head, initial encounter  Closed odontoid fracture with delayed healing, subsequent encounter  Closed displaced fracture of first cervical vertebra, unspecified fracture morphology, initial encounter [facility])    Rx / DC Orders ED Discharge Orders     None        Pollyann Savoy, MD 01/19/23 [phone]

## 2023-01-19 NOTE — ED Triage Notes (Addendum)
Pt arrived via [facility] after a fall hitting the posterior aspect of his head. Approx one inch laceration noted. EMS reports no loc. GCS of 15 and no blood thinners. Pt arrives alert to person but not place or time. Hx of dementia. Unable to tell events of fall. Wife arrived and states he had 2 drinks of whiskey last night. States pt did have loc with the fall.

## 2023-01-27 DIAGNOSIS — H2511 Age-related nuclear cataract, right eye: Secondary | ICD-10-CM | POA: Diagnosis not present

## 2023-01-30 DIAGNOSIS — G8929 Other chronic pain: Secondary | ICD-10-CM | POA: Diagnosis not present

## 2023-01-30 DIAGNOSIS — G500 Trigeminal neuralgia: Secondary | ICD-10-CM | POA: Diagnosis not present

## 2023-01-30 DIAGNOSIS — R296 Repeated falls: Secondary | ICD-10-CM | POA: Diagnosis not present

## 2023-01-30 DIAGNOSIS — S0101XD Laceration without foreign body of scalp, subsequent encounter: Secondary | ICD-10-CM | POA: Diagnosis not present

## 2023-01-30 DIAGNOSIS — F1020 Alcohol dependence, uncomplicated: Secondary | ICD-10-CM | POA: Diagnosis not present

## 2023-01-30 DIAGNOSIS — G3184 Mild cognitive impairment, so stated: Secondary | ICD-10-CM | POA: Diagnosis not present

## 2023-01-30 DIAGNOSIS — Z6830 Body mass index (BMI) 30.0-30.9, adult: Secondary | ICD-10-CM | POA: Diagnosis not present

## 2023-01-30 DIAGNOSIS — F3170 Bipolar disorder, currently in remission, most recent episode unspecified: Secondary | ICD-10-CM | POA: Diagnosis not present

## 2023-01-30 DIAGNOSIS — M542 Cervicalgia: Secondary | ICD-10-CM | POA: Diagnosis not present

## 2023-02-03 DIAGNOSIS — H2513 Age-related nuclear cataract, bilateral: Secondary | ICD-10-CM | POA: Diagnosis not present

## 2023-02-18 DIAGNOSIS — Z0100 Encounter for examination of eyes and vision without abnormal findings: Secondary | ICD-10-CM | POA: Diagnosis not present

## 2023-02-18 DIAGNOSIS — S0502XA Injury of conjunctiva and corneal abrasion without foreign body, left eye, initial encounter: Secondary | ICD-10-CM | POA: Diagnosis not present

## 2023-03-10 DIAGNOSIS — R600 Localized edema: Secondary | ICD-10-CM | POA: Diagnosis not present

## 2023-03-18 ENCOUNTER — Emergency Department (HOSPITAL_BASED_OUTPATIENT_CLINIC_OR_DEPARTMENT_OTHER): Payer: Medicare HMO

## 2023-03-18 ENCOUNTER — Encounter (HOSPITAL_BASED_OUTPATIENT_CLINIC_OR_DEPARTMENT_OTHER): Payer: Self-pay

## 2023-03-18 ENCOUNTER — Emergency Department (HOSPITAL_BASED_OUTPATIENT_CLINIC_OR_DEPARTMENT_OTHER): Payer: Medicare HMO | Admitting: Radiology

## 2023-03-18 ENCOUNTER — Emergency Department (HOSPITAL_BASED_OUTPATIENT_CLINIC_OR_DEPARTMENT_OTHER)
Admission: EM | Admit: 2023-03-18 | Discharge: 2023-03-18 | Discharge status: Against Medical Advice | Payer: Medicare HMO | Attending: Emergency Medicine | Admitting: Emergency Medicine

## 2023-03-18 ENCOUNTER — Other Ambulatory Visit: Payer: Self-pay

## 2023-03-18 DIAGNOSIS — W01198A Fall on same level from slipping, tripping and stumbling with subsequent striking against other object, initial encounter: Secondary | ICD-10-CM | POA: Insufficient documentation

## 2023-03-18 DIAGNOSIS — S12001D Unspecified nondisplaced fracture of first cervical vertebra, subsequent encounter for fracture with routine healing: Secondary | ICD-10-CM | POA: Diagnosis not present

## 2023-03-18 DIAGNOSIS — R42 Dizziness and giddiness: Secondary | ICD-10-CM | POA: Diagnosis not present

## 2023-03-18 DIAGNOSIS — M5126 Other intervertebral disc displacement, lumbar region: Secondary | ICD-10-CM | POA: Diagnosis not present

## 2023-03-18 DIAGNOSIS — R600 Localized edema: Secondary | ICD-10-CM | POA: Diagnosis not present

## 2023-03-18 DIAGNOSIS — I10 Essential (primary) hypertension: Secondary | ICD-10-CM | POA: Insufficient documentation

## 2023-03-18 DIAGNOSIS — S12600D Unspecified displaced fracture of seventh cervical vertebra, subsequent encounter for fracture with routine healing: Secondary | ICD-10-CM | POA: Diagnosis not present

## 2023-03-18 DIAGNOSIS — J449 Chronic obstructive pulmonary disease, unspecified: Secondary | ICD-10-CM | POA: Insufficient documentation

## 2023-03-18 DIAGNOSIS — M25561 Pain in right knee: Secondary | ICD-10-CM | POA: Insufficient documentation

## 2023-03-18 DIAGNOSIS — M5136 Other intervertebral disc degeneration, lumbar region with discogenic back pain only: Secondary | ICD-10-CM | POA: Diagnosis not present

## 2023-03-18 DIAGNOSIS — F0390 Unspecified dementia without behavioral disturbance: Secondary | ICD-10-CM | POA: Diagnosis not present

## 2023-03-18 DIAGNOSIS — W19XXXA Unspecified fall, initial encounter: Secondary | ICD-10-CM | POA: Diagnosis not present

## 2023-03-18 DIAGNOSIS — R519 Headache, unspecified: Secondary | ICD-10-CM | POA: Diagnosis not present

## 2023-03-18 DIAGNOSIS — S12101D Unspecified nondisplaced fracture of second cervical vertebra, subsequent encounter for fracture with routine healing: Secondary | ICD-10-CM | POA: Diagnosis not present

## 2023-03-18 DIAGNOSIS — Z043 Encounter for examination and observation following other accident: Secondary | ICD-10-CM | POA: Diagnosis not present

## 2023-03-18 DIAGNOSIS — R410 Disorientation, unspecified: Secondary | ICD-10-CM | POA: Diagnosis not present

## 2023-03-18 DIAGNOSIS — J45909 Unspecified asthma, uncomplicated: Secondary | ICD-10-CM | POA: Insufficient documentation

## 2023-03-18 DIAGNOSIS — Z79899 Other long term (current) drug therapy: Secondary | ICD-10-CM | POA: Diagnosis not present

## 2023-03-18 DIAGNOSIS — M25571 Pain in right ankle and joints of right foot: Secondary | ICD-10-CM | POA: Diagnosis not present

## 2023-03-18 DIAGNOSIS — S0990XA Unspecified injury of head, initial encounter: Secondary | ICD-10-CM | POA: Diagnosis not present

## 2023-03-18 DIAGNOSIS — M4316 Spondylolisthesis, lumbar region: Secondary | ICD-10-CM | POA: Diagnosis not present

## 2023-03-18 LAB — CBC WITH DIFFERENTIAL/PLATELET
Abs Immature Granulocytes: 0.01 10*3/uL (ref 0.00–0.07)
Basophils Absolute: 0 10*3/uL (ref 0.0–0.1)
Basophils Relative: 1 %
Eosinophils Absolute: 0.1 10*3/uL (ref 0.0–0.5)
Eosinophils Relative: 1 %
HCT: 36.9 % — ABNORMAL LOW (ref 39.0–52.0)
Hemoglobin: 12.9 g/dL — ABNORMAL LOW (ref 13.0–17.0)
Immature Granulocytes: 0 %
Lymphocytes Relative: 25 %
Lymphs Abs: 2.2 10*3/uL (ref 0.7–4.0)
MCH: 35.3 pg — ABNORMAL HIGH (ref 26.0–34.0)
MCHC: 35 g/dL (ref 30.0–36.0)
MCV: 101.1 fL — ABNORMAL HIGH (ref 80.0–100.0)
Monocytes Absolute: 1.2 10*3/uL — ABNORMAL HIGH (ref 0.1–1.0)
Monocytes Relative: 14 %
Neutro Abs: 5.1 10*3/uL (ref 1.7–7.7)
Neutrophils Relative %: 59 %
Platelets: 145 10*3/uL — ABNORMAL LOW (ref 150–400)
RBC: 3.65 MIL/uL — ABNORMAL LOW (ref 4.22–5.81)
RDW: 13.4 % (ref 11.5–15.5)
WBC: 8.6 10*3/uL (ref 4.0–10.5)
nRBC: 0 % (ref 0.0–0.2)

## 2023-03-18 LAB — URINALYSIS, ROUTINE W REFLEX MICROSCOPIC
Bilirubin Urine: NEGATIVE
Glucose, UA: NEGATIVE mg/dL
Hgb urine dipstick: NEGATIVE
Ketones, ur: NEGATIVE mg/dL
Leukocytes,Ua: NEGATIVE
Nitrite: NEGATIVE
Protein, ur: NEGATIVE mg/dL
Specific Gravity, Urine: 1.017 (ref 1.005–1.030)
pH: 6 (ref 5.0–8.0)

## 2023-03-18 LAB — COMPREHENSIVE METABOLIC PANEL
ALT: 21 U/L (ref 0–44)
AST: 40 U/L (ref 15–41)
Albumin: 3.6 g/dL (ref 3.5–5.0)
Alkaline Phosphatase: 71 U/L (ref 38–126)
Anion gap: 7 (ref 5–15)
BUN: 13 mg/dL (ref 8–23)
CO2: 33 mmol/L — ABNORMAL HIGH (ref 22–32)
Calcium: 8.8 mg/dL — ABNORMAL LOW (ref 8.9–10.3)
Chloride: 94 mmol/L — ABNORMAL LOW (ref 98–111)
Creatinine, Ser: 0.77 mg/dL (ref 0.61–1.24)
GFR, Estimated: >60 mL/min (ref >60)
Glucose, Bld: 125 mg/dL — ABNORMAL HIGH (ref 70–99)
Potassium: 3.4 mmol/L — ABNORMAL LOW (ref 3.5–5.1)
Sodium: 134 mmol/L — ABNORMAL LOW (ref 135–145)
Total Bilirubin: 1.5 mg/dL — ABNORMAL HIGH (ref 0.3–1.2)
Total Protein: 6.9 g/dL (ref 6.5–8.1)

## 2023-03-18 LAB — BRAIN NATRIURETIC PEPTIDE: B Natriuretic Peptide: 142.9 pg/mL — ABNORMAL HIGH (ref 0.0–100.0)

## 2023-03-18 NOTE — ED Notes (Signed)
Pt request update, Pt informed waiting on rad results... Pt request to leave... Pt informed I would let the provider now... Provider informed.Marland KitchenMarland Kitchen

## 2023-03-18 NOTE — ED Notes (Signed)
ED Provider at bedside. 

## 2023-03-18 NOTE — ED Provider Notes (Signed)
[place] EMERGENCY DEPARTMENT AT [facility] Provider Note   CSN: [id number] Arrival date & time: 03/18/23  1524     History {Add pertinent medical, surgical, social history, OB history to HPI:1} No chief complaint on file.   Gabriel Richardson is a 77 y.o. male.  HPI     77 year old male with a history of COPD, depression, bipolar disorder, hypertension, asthma, benign essential tremor, trigeminal neuralgia, cervical stenosis , cognitive impairment with behavior changes (most suggestive of alzheimer's dementia,) who presents with concern for fall, increased confusion, headache, knee pain.   Presents with his wife who reports he has had decline, seeming more confused particularly at night, sometimes not recognizing her, is up not knowing where he is.  2 days ago he fell, he reports he has had balance problems for a long time without acute change, that sometimes leads to him falling down and that this happened 2 days ago.  Denies having any lightheadedness or syncope.  He did hit his head.  Since then he has had a headache, right knee pain that has been more severe, as well as low back pain.  He does report he has had back pain for a long time and his wife corroborates this, but that it has been worse since the fall.  They deny numbness, weakness, change in vision, difficulty talking, facial droop or other concerns.  Denies chest pain, shortness of breath, nausea, vomiting, urinary symptoms, fever, cough, diarrhea.  His wife does feel that he has been worse since starting the memantine, more sleepy and off balance.     Went to his physician's office today with concern for fall 2 days ago, with headache, back pain and knee pain.  They also report increased leg swelling for the last week, right greater than left.  He has had some mild right ankle swelling since a prior surgery a long time ago, but the swelling has become significantly worse on that side, and he is also developed some  swelling on the left side.  Past Medical History:  Diagnosis Date   Ankle fracture    right with syndesmotic disruption   Anxiety    Arthritis    Asthma    Bipolar disorder (HCC)    Cervical stenosis of spinal canal    Closed fracture of right distal fibula 01/07/2019   COPD (chronic obstructive pulmonary disease) (HCC)    Depression    Emphysema of lung (HCC)    Neuromuscular disorder (HCC)    Trigeminal neuralgia    Wears glasses     Home Medications Prior to Admission medications   Medication Sig Start Date End Date Taking? Authorizing Provider  acetaminophen (TYLENOL) 500 MG tablet Take 1,000 mg by mouth every 6 (six) hours as needed for mild pain or headache.    [provider]  cyclobenzaprine (FLEXERIL) 10 MG tablet Take 10 mg by mouth 3 (three) times daily. 12/17/22   [provider]  lamoTRIgine (LAMICTAL) 200 MG tablet Take 200 mg by mouth at bedtime. 01/18/15   [provider]  memantine (NAMENDA) 10 MG tablet Take half tablet daily x 1 week, then take half tablet twice daily x 1 week, then half tablet in the morning and 1 tablet at bedtime x 1 week, then continue 1 tablet twice daily. 01/03/23   Nita Sickle K, DO  oxyCODONE-acetaminophen (PERCOCET) 10-325 MG tablet Take 1 tablet by mouth 2 (two) times daily as needed. 12/08/22   [provider]  pregabalin (LYRICA)  100 MG capsule Take 1 capsule (100 mg total) by mouth 2 (two) times daily. Take 1 tablet daily twice daily. 12/18/22   Nita Sickle K, DO  primidone (MYSOLINE) 50 MG tablet TAKE TWO TABLETS BY MOUTH TWICE A DAY 04/08/22   Patel, Donika K, DO  propranolol ER (INDERAL LA) 120 MG 24 hr capsule Take 1 capsule (120 mg total) by mouth 2 (two) times daily. OK to take extra tablet as needed for worsening tremor. 04/08/22   Nita Sickle K, DO  sildenafil (VIAGRA) 100 MG tablet Take 1 tablet (100 mg total) by mouth daily as needed for erectile dysfunction. 03/08/15   Collene Gobble, MD   zolpidem (AMBIEN) 10 MG tablet Take 10 mg by mouth at bedtime. Takes 12.5 mg at bedtime 07/19/15   [provider]      Allergies    Patient has no known allergies.    Review of Systems   Review of Systems  Physical Exam Updated Vital Signs There were no vitals taken for this visit. Physical Exam Vitals and nursing note reviewed.  Constitutional:      General: He is not in acute distress.    Appearance: Normal appearance. He is well-developed. He is not ill-appearing or diaphoretic.  HENT:     Head: Normocephalic.  Eyes:     General: Visual field deficit: difficulty following directions, continuing to look at hand rather than using periphery.     Extraocular Movements: Extraocular movements intact.     Conjunctiva/sclera: Conjunctivae normal.     Pupils: Pupils are equal, round, and reactive to light.  Cardiovascular:     Rate and Rhythm: Normal rate and regular rhythm.     Pulses: Normal pulses.     Heart sounds: Normal heart sounds. No murmur heard.    No friction rub. No gallop.  Pulmonary:     Effort: Pulmonary effort is normal. No respiratory distress.     Breath sounds: Normal breath sounds. No wheezing or rales.  Abdominal:     General: There is no distension.     Palpations: Abdomen is soft.     Tenderness: There is no abdominal tenderness. There is no guarding.  Musculoskeletal:        General: Tenderness (medial right knee) present. No swelling.     Cervical back: Normal range of motion.     Right lower leg: Edema (right greater than left) present.     Left lower leg: Edema present.  Skin:    General: Skin is warm and dry.     Findings: No erythema or rash.  Neurological:     General: No focal deficit present.     Mental Status: He is alert and oriented to person, place, and time.     GCS: GCS eye subscore is 4. GCS verbal subscore is 5. GCS motor subscore is 6.     Cranial Nerves: No cranial nerve deficit, dysarthria or facial asymmetry.      Sensory: No sensory deficit.     Motor: No weakness or tremor.     Coordination: Finger-Nose-Finger Test normal.     Gait: Gait normal.     ED Results / Procedures / Treatments   Labs (all labs ordered are listed, but only abnormal results are displayed) Labs Reviewed - No data to display  EKG None  Radiology No results found.  Procedures Procedures  {Document cardiac monitor, telemetry assessment procedure when appropriate:1}  Medications Ordered in ED Medications - No data to display  ED Course/ Medical Decision Making/ A&P   {   Click here for ABCD2, HEART and other calculatorsREFRESH Note before signing :1}                                    77 year old male with a history of COPD, depression, bipolar disorder, hypertension, asthma, benign essential tremor, trigeminal neuralgia, cervical stenosis , cognitive impairment with behavior changes (most suggestive of alzheimer's dementia,) who presents with concern for fall, increased confusion, headache, knee pain.   Differential diagnosis for his increasing confusion includes ICH, CVA, electrolyte abnormality, anemia, infection, pain, medication effects, cardiac rhythm abnormality, progression of suspected dementia.  Labs completed and personally evaluated interpreted by me show***.  For his bilateral leg swelling, obtained labs which show an albumin***, BNP***.  Right leg worse than left and DVT study was ordered which shows***.  Chest x-ray shows***  CT head completed and personally about interpreted by me shows***.  CT cervical spine was done given history of head trauma and age shows***  X-ray of the right knee shows ***.     {Document critical care time when appropriate:1} {Document review of labs and clinical decision tools ie heart score, Chads2Vasc2 etc:1}  {Document your independent review of radiology images, and any outside records:1} {Document your discussion with family members, caretakers, and with  consultants:1} {Document social determinants of health affecting pt's care:1} {Document your decision making why or why not admission, treatments were needed:1} Final Clinical Impression(s) / ED Diagnoses Final diagnoses:  None    Rx / DC Orders ED Discharge Orders     None

## 2023-03-18 NOTE — ED Notes (Addendum)
Pt requested another update... Pt informed again of reason he is waiting (waiting on results and the provider to speak with him).. Pt requested to leave and stated "Im leaving right now"... Pt informed he should wait to at least speak to the MD before leaving... Pt refused... Pt informed if he leaves, he is leaving AMA... Pt informed of all pertinent AMA info... Pt stated that he understood, his wife agreed and he signed the AMA form on the computer... Pt assisted to a wheelchair... IV removed... Pt assisted to his vehicle and assisted to the front passenger seat... Provider informed before PT walked out the hospital..Marland Kitchen

## 2023-03-18 NOTE — ED Triage Notes (Signed)
Pt POV from home with family reporting dizziness upon standing. Pt fell 2 days ago, reporting R ankle and back pain. Pt also hit head, denies LOC. Axo x4, neg stroke screen in triage.

## 2023-03-19 DIAGNOSIS — W19XXXA Unspecified fall, initial encounter: Secondary | ICD-10-CM | POA: Diagnosis not present

## 2023-03-19 DIAGNOSIS — R42 Dizziness and giddiness: Secondary | ICD-10-CM | POA: Diagnosis not present

## 2023-03-26 DIAGNOSIS — Z23 Encounter for immunization: Secondary | ICD-10-CM | POA: Diagnosis not present

## 2023-03-26 DIAGNOSIS — S12001G Unspecified nondisplaced fracture of first cervical vertebra, subsequent encounter for fracture with delayed healing: Secondary | ICD-10-CM | POA: Diagnosis not present

## 2023-03-26 DIAGNOSIS — G309 Alzheimer's disease, unspecified: Secondary | ICD-10-CM | POA: Diagnosis not present

## 2023-03-26 DIAGNOSIS — S50812D Abrasion of left forearm, subsequent encounter: Secondary | ICD-10-CM | POA: Diagnosis not present

## 2023-03-26 DIAGNOSIS — F3170 Bipolar disorder, currently in remission, most recent episode unspecified: Secondary | ICD-10-CM | POA: Diagnosis not present

## 2023-03-26 DIAGNOSIS — R58 Hemorrhage, not elsewhere classified: Secondary | ICD-10-CM | POA: Diagnosis not present

## 2023-03-26 DIAGNOSIS — R296 Repeated falls: Secondary | ICD-10-CM | POA: Diagnosis not present

## 2023-03-26 DIAGNOSIS — F02A18 Dementia in other diseases classified elsewhere, mild, with other behavioral disturbance: Secondary | ICD-10-CM | POA: Diagnosis not present

## 2023-03-26 DIAGNOSIS — F1020 Alcohol dependence, uncomplicated: Secondary | ICD-10-CM | POA: Diagnosis not present

## 2023-03-31 ENCOUNTER — Other Ambulatory Visit: Payer: Self-pay | Admitting: Neurology

## 2023-04-02 ENCOUNTER — Encounter: Payer: Self-pay | Admitting: Neurology

## 2023-04-02 ENCOUNTER — Ambulatory Visit: Payer: Medicare HMO | Admitting: Neurology

## 2023-04-02 VITALS — BP 95/61 | HR 58 | Ht 70.0 in

## 2023-04-02 DIAGNOSIS — G250 Essential tremor: Secondary | ICD-10-CM | POA: Diagnosis not present

## 2023-04-02 DIAGNOSIS — F03918 Unspecified dementia, unspecified severity, with other behavioral disturbance: Secondary | ICD-10-CM | POA: Diagnosis not present

## 2023-04-02 DIAGNOSIS — G500 Trigeminal neuralgia: Secondary | ICD-10-CM

## 2023-04-02 NOTE — Progress Notes (Signed)
Follow-up Visit   Date: 04/02/23    Gabriel Richardson MRN: [id number] DOB: September 15, 1945   Interim History: Gabriel Richardson is a 77 y.o. right-handed Caucasian male with history of depression, asthma, and hypertension here with new cognitive complaints.  He is seen here for essential tremor and trigeminal neuralgia.  The patient was accompanied to the clinic by wife.  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   - Continue Namenda 10mg  BID  - Due to bradycardia, do not recommend Aricept  - Strongly urged him to cut back on alcohol  - Nonpharmacological strategies discussed to manage agitation  - Discussed importance of having wife assist with complex decision making  Benign essential tremor, stable  - Continue primidone 100mg  twice daily  - Continue propranolol 120mg  twice daily.  3.  Right trigeminal neuralgia, stable.  Pain is controlled on Lyrica.   - Previously tried:  Gabapentin, tegretol  - Continue Lyrica 100mg  twice daily  - He takes lamictal 200mg  at bedtime as a mood stabilizer prescribed by Dr. Donell Beers  4.  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    - Start PT for leg strengthening  - MRI lumbar spine, if no improvement.  He would like to discuss imaging with his pain specialist  Return to clinic in 6 months   --------------------------------------------------------- History of present illness: Initial visit 04/30/2013:  He had previously been a patient of Dr. Sandria Manly since 1995, initially for right trigeminal neuralgia which he had frequent flares of since 1997 - early 2000. He has tried trileptal, neurontin, and lyrica and had the most relief with Lyrica.  He has not had a flare since 2000, until 2016, and again in 2024.    Around 2012, he started developing muscle cramps and left hand tremors and was concerned about multiple sclerosis. Although there was white matter changes on his MRI, Dr. Sandria Manly did  not find that his exam and imaging was consistent with demyelinating disease. He also had CSF testing in January 2014 which did not show any inflammatory changes. He treated him symptomatically and was started on propranolol 60mg  which has helped with his tremors.   He is s/p anterior cervical fusion by Dr. Trey Sailors in 2010 and has history of left L5 radiculopathy status post L5-S1 laminectomy.  He saw Dr. Hosie Poisson in July 2016 for new left ptosis.  Myasthenia antibodies were checked and was normal.  He declined to have additional testing done to evaluate this.  Starting in 2018, his hand tremors started getting worse.  His propranolol was steadily increased to 220/d without benefit, so primidone was added and titrated to 150mg /d.  UPDATE 04/08/2022:  He is here for follow-up visit.  Tremors are doing very well on primidone 100mg  BID and propranolol 120mg  BID.  He rarely takes an extra dose of propranolol.  No interval hospitalizations, illnesses, or falls.  He uses a cane for support due to low back pain, for which he gets ESI.  No new neurological complaints.   UPDATE 08/13/2022:  He is here for sooner than scheduled visit due to recurrence of right trigeminal neuralgia.  He started having severe right sided facial pain and was started on Lyrica 100mg  twice daily.  Pain is relieved with Lyrica and he no longer has facial pain.  He has stopped using hydrocodone for low back pain since starting Lyrica.  Tremors remain stable on primidone 100mg  BID and propranolol 120mg  BID.   UPDATE 10/15/2022:  He had two falls about a month ago and since this time he has neck stiffness, unable to rotate his head and has severe pain.  He notices that he is lightheaded when he tries to standup.  He complains of left sided head which is new.   Trigeminal neuralgia is much better controlled on Lyrica 100mg  twice daily.  He continues to have tremors of the hands which are stable, he has good days and bad days.  He is compliant  with primidone 100mg  BID and propranolol 120mg  BID.   UPDATE 12/25/2022:  He is here for sooner follow-up visit due to concern of worsening cognition. He is here alone.  Patient has noticed that his memory is getting worse.  He sometimes forgets peoples names and details of conversations. He has difficulty recalling day-to-day details, but can remember things from years ago easily. He feels that it is part of normal aging.  His wife helps with medication and he has an Geophysicist/field seismologist that manages finances.  He does not drink.  He denies behavior changes. His primary concern and frustration is lack of pain relief for his neck pain.  He is seeing pain management and takes oxycodone 10mg  twice daily.  He does not feel that it is enough to control pain and tells me that his greatest benefit was with getting ESI.  Pain management physician contacted me stating that his wife indicated concerns of patient wandering and agitated in the evening.  PCP also mentioned concerned of his alcohol use.  He tells me that he drinks 2 manhattan's daily.    UPDATE 04/02/2023:  He is here for follow-up visit with his wife.  This is the first time I am meeting his wife.  They got married 7 years ago, but have been together for 9 years.  She is actively involved in his care.  Over the past several months, there continues to be a gradual declined in cognition where he is forgetting appointments, medications, and sometimes difficult to control at home because of agitation.  Wife reports that he is no longer driving, but on one occasion, he did not listen to her and drive to [employer] where he purchased $100 worth of food.  When he was carrying the food into the home, he had a fall.  He claimed to buy it for his neighbors. Wife manages medications, but has noticed that he may take oxycodone more than once at bedtime.  He reports that he needs it for pain relief.  Finances are managed by [employer].  He continues to drink 2 alcoholic beverages daily and  has no interest in stopping or reducing this.  He is aware it has contributed to his cognition, imbalance, and falls. He arrived today in a transport chair.  He reports having bilateral leg weakness and will be starting PT next month.     Medications:  Current Outpatient Medications on File Prior to Visit  Medication Sig Dispense Refill   acetaminophen (TYLENOL) 500 MG tablet Take 1,000 mg by mouth every 6 (six) hours as needed for mild pain or headache.     cyclobenzaprine (FLEXERIL) 10 MG tablet Take 10 mg by mouth 3 (three) times daily.     lamoTRIgine (LAMICTAL) 200 MG tablet Take 200 mg by mouth at bedtime.  1   memantine (NAMENDA) 10 MG tablet Take half tablet daily x 1 week, then take half tablet twice daily x 1 week, then half tablet in the morning and 1 tablet at bedtime x 1  week, then continue 1 tablet twice daily. (Patient taking differently: 1 tablet twice daily.) 60 tablet 3   oxyCODONE-acetaminophen (PERCOCET) 10-325 MG tablet Take 1 tablet by mouth 2 (two) times daily as needed.     pregabalin (LYRICA) 100 MG capsule Take 1 capsule (100 mg total) by mouth 2 (two) times daily. Take 1 tablet daily twice daily. 60 capsule 5   primidone (MYSOLINE) 50 MG tablet TAKE 2 TABLETS BY MOUTH TWICE A DAY 360 tablet 1   propranolol ER (INDERAL LA) 120 MG 24 hr capsule Take 1 capsule (120 mg total) by mouth 2 (two) times daily. OK to take extra tablet as needed for worsening tremor. 200 capsule 3   sildenafil (VIAGRA) 100 MG tablet Take 1 tablet (100 mg total) by mouth daily as needed for erectile dysfunction. 12 tablet 11   zolpidem (AMBIEN) 10 MG tablet Take 10 mg by mouth at bedtime. Takes 12.5 mg at bedtime  1   No current facility-administered medications on file prior to visit.    Allergies: No Known Allergies  Vital Signs:  BP 95/61   Pulse (!) 58   Ht 5\' 10"  (1.778 m)   SpO2 94%   BMI 28.41 kg/m    Neurological Exam: MENTAL STATUS including orientation to year, month, day  (missed date by one day). Normal attention span and concentration, language, and fund of knowledge is normal.  Speech is not dysarthric.  CRANIAL NERVES:  Pupils equal round and reactive to light.  Normal conjugate, extra-ocular eye movements in all directions of gaze.  Mild left ptosis (old).  Face is symmetric.   MOTOR:  Motor strength is 5/5 in all extremities, except 4/5 bilateral hip flexors. Mild postural tremor on the right > left hand.  No atrophy, fasciculations or abnormal movements.  No pronator drift.  Tone is normal.    MSRs:                                           Right        Left brachioradialis 2+  2+  biceps 2+  2+  triceps 2+  2+  patellar 2+  2+  ankle jerk 0  0   COORDINATION/GAIT:  Normal finger-to- nose-finger.  Intact rapid alternating movements bilaterally.  Gait is slow, mildly wide-based, assisted with a cane, appears unsteady.   Data: AChR blocking and binding 11/02/2012 - neg  CSF 06/02/2012:  R0  W1  G63  P43  IgG index 0.43   OCB -neg  MRI brain wwo contrast 07/08/2009: 1. Advanced white matter disease in a pattern compatible with multiple sclerosis. Mild progression since 2004. No areas of   enhancement.   2. No acute intracranial abnormality.  MRI thoracic and lumbar spine 03/16/2021: 1. Postsurgical changes from laminectomy at the T2-3 level with interval resolution of spinal canal stenosis. Improvement of the neural foraminal narrowing at this level, now mild bilaterally. 2. Normal appearance of the thoracic spinal cord on T2 weighted images with heterogeneous signal on post contrast images in the cord from the C6 through the T2 level which is favored to represent artifact rather than contrast enhancement. However, pathological contrast enhancement cannot be excluded. In case of high clinical concern for cord lesion at this level, repeat images are recommended. 3. Acute/subacute compression fracture of the superior endplate of L2 with  approximately 30% loss of vertebral body  height without retropulsion. 4. Degenerative changes and anterolisthesis at L5-S1 resulting in severe narrowing of the left subarticular zone and severe left neural foraminal narrowing. 5. 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  MRI cervical spine wo contrast  03/16/2021: 1. Interval ACDF of C3-4 with slight retrolisthesis of C3 on C4. Mild canal stenosis at this level has slightly improved from prior. 2. Prior ACDF at C4-5 and C5-6 with solid arthrodesis. No residual canal stenosis. 3. Stable degenerative changes of the cervical spine including borderline-mild canal stenosis at C7-T1 with moderate left and mild right foraminal stenosis. 4. Diffuse marrow heterogeneity without discrete marrow replacing lesion. Findings are nonspecific but can be seen in the setting of chronic anemia, smoking, and/or obesity.   MRI brain wo contrast 01/03/2023: 1. Moderate parenchymal volume loss without discernible lobar predilection or disproportionate hippocampal atrophy. 2. Moderate to advanced underlying chronic small-vessel ischemic change. 3. No acute finding.   Total time spent reviewing records, interview, history/exam, documentation, and coordination of care on day of encounter:  45 min     Thank you for allowing me to participate in patient's care.  If I can answer any additional questions, I would be pleased to do so.    Sincerely,    Rianne Degraaf K. Allena Katz, DO

## 2023-04-14 ENCOUNTER — Ambulatory Visit: Payer: Medicare HMO | Admitting: Neurology

## 2023-04-28 ENCOUNTER — Other Ambulatory Visit: Payer: Self-pay | Admitting: Neurology

## 2023-05-29 ENCOUNTER — Other Ambulatory Visit: Payer: Self-pay | Admitting: Neurology

## 2023-06-02 DIAGNOSIS — R600 Localized edema: Secondary | ICD-10-CM | POA: Diagnosis not present

## 2023-07-02 DIAGNOSIS — R54 Age-related physical debility: Secondary | ICD-10-CM | POA: Diagnosis not present

## 2023-07-02 DIAGNOSIS — F02A18 Dementia in other diseases classified elsewhere, mild, with other behavioral disturbance: Secondary | ICD-10-CM | POA: Diagnosis not present

## 2023-07-02 DIAGNOSIS — F1020 Alcohol dependence, uncomplicated: Secondary | ICD-10-CM | POA: Diagnosis not present

## 2023-07-02 DIAGNOSIS — M79645 Pain in left finger(s): Secondary | ICD-10-CM | POA: Diagnosis not present

## 2023-07-02 DIAGNOSIS — F1090 Alcohol use, unspecified, uncomplicated: Secondary | ICD-10-CM | POA: Diagnosis not present

## 2023-07-02 DIAGNOSIS — R296 Repeated falls: Secondary | ICD-10-CM | POA: Diagnosis not present

## 2023-07-02 DIAGNOSIS — G309 Alzheimer's disease, unspecified: Secondary | ICD-10-CM | POA: Diagnosis not present

## 2023-07-02 DIAGNOSIS — D7589 Other specified diseases of blood and blood-forming organs: Secondary | ICD-10-CM | POA: Diagnosis not present

## 2023-07-02 DIAGNOSIS — R600 Localized edema: Secondary | ICD-10-CM | POA: Diagnosis not present

## 2023-07-08 ENCOUNTER — Other Ambulatory Visit: Payer: Self-pay | Admitting: Neurology

## 2023-07-27 ENCOUNTER — Encounter (HOSPITAL_COMMUNITY): Payer: Self-pay

## 2023-07-27 ENCOUNTER — Emergency Department (HOSPITAL_COMMUNITY)

## 2023-07-27 ENCOUNTER — Other Ambulatory Visit: Payer: Self-pay

## 2023-07-27 ENCOUNTER — Inpatient Hospital Stay (HOSPITAL_COMMUNITY)
Admission: EM | Admit: 2023-07-27 | Discharge: 2023-07-29 | DRG: 070 | Discharge status: Against Medical Advice | Attending: Internal Medicine | Admitting: Internal Medicine

## 2023-07-27 DIAGNOSIS — F02818 Dementia in other diseases classified elsewhere, unspecified severity, with other behavioral disturbance: Secondary | ICD-10-CM | POA: Diagnosis not present

## 2023-07-27 DIAGNOSIS — F319 Bipolar disorder, unspecified: Secondary | ICD-10-CM | POA: Diagnosis not present

## 2023-07-27 DIAGNOSIS — I5020 Unspecified systolic (congestive) heart failure: Secondary | ICD-10-CM | POA: Diagnosis present

## 2023-07-27 DIAGNOSIS — S12101A Unspecified nondisplaced fracture of second cervical vertebra, initial encounter for closed fracture: Secondary | ICD-10-CM | POA: Diagnosis not present

## 2023-07-27 DIAGNOSIS — W19XXXA Unspecified fall, initial encounter: Secondary | ICD-10-CM | POA: Diagnosis not present

## 2023-07-27 DIAGNOSIS — Z79899 Other long term (current) drug therapy: Secondary | ICD-10-CM

## 2023-07-27 DIAGNOSIS — I514 Myocarditis, unspecified: Secondary | ICD-10-CM | POA: Diagnosis not present

## 2023-07-27 DIAGNOSIS — I214 Non-ST elevation (NSTEMI) myocardial infarction: Secondary | ICD-10-CM | POA: Diagnosis not present

## 2023-07-27 DIAGNOSIS — M25551 Pain in right hip: Secondary | ICD-10-CM | POA: Diagnosis present

## 2023-07-27 DIAGNOSIS — G309 Alzheimer's disease, unspecified: Secondary | ICD-10-CM | POA: Diagnosis present

## 2023-07-27 DIAGNOSIS — J4489 Other specified chronic obstructive pulmonary disease: Secondary | ICD-10-CM | POA: Diagnosis present

## 2023-07-27 DIAGNOSIS — R079 Chest pain, unspecified: Secondary | ICD-10-CM | POA: Diagnosis not present

## 2023-07-27 DIAGNOSIS — Z87891 Personal history of nicotine dependence: Secondary | ICD-10-CM

## 2023-07-27 DIAGNOSIS — I499 Cardiac arrhythmia, unspecified: Secondary | ICD-10-CM | POA: Diagnosis not present

## 2023-07-27 DIAGNOSIS — R4182 Altered mental status, unspecified: Secondary | ICD-10-CM | POA: Diagnosis not present

## 2023-07-27 DIAGNOSIS — M4802 Spinal stenosis, cervical region: Secondary | ICD-10-CM | POA: Diagnosis not present

## 2023-07-27 DIAGNOSIS — Z043 Encounter for examination and observation following other accident: Secondary | ICD-10-CM | POA: Diagnosis not present

## 2023-07-27 DIAGNOSIS — M25552 Pain in left hip: Secondary | ICD-10-CM | POA: Diagnosis present

## 2023-07-27 DIAGNOSIS — R410 Disorientation, unspecified: Secondary | ICD-10-CM | POA: Diagnosis not present

## 2023-07-27 DIAGNOSIS — G9341 Metabolic encephalopathy: Secondary | ICD-10-CM | POA: Diagnosis not present

## 2023-07-27 DIAGNOSIS — I5021 Acute systolic (congestive) heart failure: Secondary | ICD-10-CM | POA: Diagnosis present

## 2023-07-27 DIAGNOSIS — R55 Syncope and collapse: Principal | ICD-10-CM | POA: Diagnosis present

## 2023-07-27 DIAGNOSIS — G4700 Insomnia, unspecified: Secondary | ICD-10-CM | POA: Diagnosis present

## 2023-07-27 DIAGNOSIS — I319 Disease of pericardium, unspecified: Secondary | ICD-10-CM | POA: Diagnosis not present

## 2023-07-27 DIAGNOSIS — J439 Emphysema, unspecified: Secondary | ICD-10-CM | POA: Diagnosis not present

## 2023-07-27 DIAGNOSIS — G250 Essential tremor: Secondary | ICD-10-CM | POA: Diagnosis present

## 2023-07-27 DIAGNOSIS — I513 Intracardiac thrombosis, not elsewhere classified: Secondary | ICD-10-CM | POA: Diagnosis not present

## 2023-07-27 DIAGNOSIS — Z981 Arthrodesis status: Secondary | ICD-10-CM

## 2023-07-27 DIAGNOSIS — R9082 White matter disease, unspecified: Secondary | ICD-10-CM | POA: Diagnosis not present

## 2023-07-27 DIAGNOSIS — R442 Other hallucinations: Secondary | ICD-10-CM | POA: Diagnosis not present

## 2023-07-27 DIAGNOSIS — I6529 Occlusion and stenosis of unspecified carotid artery: Secondary | ICD-10-CM | POA: Diagnosis not present

## 2023-07-27 DIAGNOSIS — M4313 Spondylolisthesis, cervicothoracic region: Secondary | ICD-10-CM | POA: Diagnosis not present

## 2023-07-27 LAB — RAPID URINE DRUG SCREEN, HOSP PERFORMED
Amphetamines: NOT DETECTED
Barbiturates: POSITIVE — AB
Benzodiazepines: NOT DETECTED
Cocaine: NOT DETECTED
Opiates: NOT DETECTED
Tetrahydrocannabinol: NOT DETECTED

## 2023-07-27 LAB — URINALYSIS, ROUTINE W REFLEX MICROSCOPIC
Bilirubin Urine: NEGATIVE
Glucose, UA: NEGATIVE mg/dL
Hgb urine dipstick: NEGATIVE
Ketones, ur: 80 mg/dL — AB
Leukocytes,Ua: NEGATIVE
Nitrite: NEGATIVE
Protein, ur: NEGATIVE mg/dL
Specific Gravity, Urine: 1.018 (ref 1.005–1.030)
pH: 6 (ref 5.0–8.0)

## 2023-07-27 LAB — COMPREHENSIVE METABOLIC PANEL
ALT: 20 U/L (ref 0–44)
AST: 59 U/L — ABNORMAL HIGH (ref 15–41)
Albumin: 3.3 g/dL — ABNORMAL LOW (ref 3.5–5.0)
Alkaline Phosphatase: 88 U/L (ref 38–126)
Anion gap: 15 (ref 5–15)
BUN: 9 mg/dL (ref 8–23)
CO2: 21 mmol/L — ABNORMAL LOW (ref 22–32)
Calcium: 8.5 mg/dL — ABNORMAL LOW (ref 8.9–10.3)
Chloride: 99 mmol/L (ref 98–111)
Creatinine, Ser: 0.52 mg/dL — ABNORMAL LOW (ref 0.61–1.24)
GFR, Estimated: >60 mL/min (ref >60)
Glucose, Bld: 99 mg/dL (ref 70–99)
Potassium: 3.8 mmol/L (ref 3.5–5.1)
Sodium: 135 mmol/L (ref 135–145)
Total Bilirubin: 2.2 mg/dL — ABNORMAL HIGH (ref 0.0–1.2)
Total Protein: 7.2 g/dL (ref 6.5–8.1)

## 2023-07-27 LAB — SEDIMENTATION RATE: Sed Rate: 20 mm/h — ABNORMAL HIGH (ref 0–16)

## 2023-07-27 LAB — ETHANOL: Alcohol, Ethyl (B): <10 mg/dL (ref <10)

## 2023-07-27 LAB — TROPONIN I (HIGH SENSITIVITY)
Troponin I (High Sensitivity): 4157 ng/L (ref <18)
Troponin I (High Sensitivity): 3859 ng/L (ref <18)

## 2023-07-27 LAB — CBC
HCT: 41.3 % (ref 39.0–52.0)
Hemoglobin: 13.8 g/dL (ref 13.0–17.0)
MCH: 33.6 pg (ref 26.0–34.0)
MCHC: 33.4 g/dL (ref 30.0–36.0)
MCV: 100.5 fL — ABNORMAL HIGH (ref 80.0–100.0)
Platelets: 190 10*3/uL (ref 150–400)
RBC: 4.11 MIL/uL — ABNORMAL LOW (ref 4.22–5.81)
RDW: 13.7 % (ref 11.5–15.5)
WBC: 9.2 10*3/uL (ref 4.0–10.5)
nRBC: 0 % (ref 0.0–0.2)

## 2023-07-27 LAB — I-STAT CHEM 8, ED
BUN: 7 mg/dL — ABNORMAL LOW (ref 8–23)
Calcium, Ion: 1.14 mmol/L — ABNORMAL LOW (ref 1.15–1.40)
Chloride: 99 mmol/L (ref 98–111)
Creatinine, Ser: 0.6 mg/dL — ABNORMAL LOW (ref 0.61–1.24)
Glucose, Bld: 98 mg/dL (ref 70–99)
HCT: 42 % (ref 39.0–52.0)
Hemoglobin: 14.3 g/dL (ref 13.0–17.0)
Potassium: 3.6 mmol/L (ref 3.5–5.1)
Sodium: 137 mmol/L (ref 135–145)
TCO2: 23 mmol/L (ref 22–32)

## 2023-07-27 LAB — CK: Total CK: 398 U/L — ABNORMAL HIGH (ref 49–397)

## 2023-07-27 MED ORDER — MEMANTINE HCL 5 MG PO TABS
10.0000 mg | ORAL_TABLET | Freq: Two times a day (BID) | ORAL | Status: DC
  Administered 2023-07-27 – 2023-07-29 (×4): 10 mg via ORAL
  Filled 2023-07-27 (×5): qty 2

## 2023-07-27 MED ORDER — PRIMIDONE 50 MG PO TABS
50.0000 mg | ORAL_TABLET | Freq: Two times a day (BID) | ORAL | Status: DC
  Administered 2023-07-27 – 2023-07-29 (×4): 50 mg via ORAL
  Filled 2023-07-27 (×5): qty 1

## 2023-07-27 MED ORDER — ZOLPIDEM TARTRATE 5 MG PO TABS
10.0000 mg | ORAL_TABLET | Freq: Every day | ORAL | Status: DC
  Administered 2023-07-27 – 2023-07-28 (×2): 10 mg via ORAL
  Filled 2023-07-27 (×2): qty 2

## 2023-07-27 MED ORDER — IBUPROFEN 800 MG PO TABS
800.0000 mg | ORAL_TABLET | Freq: Two times a day (BID) | ORAL | Status: DC
  Administered 2023-07-28 – 2023-07-29 (×3): 800 mg via ORAL
  Filled 2023-07-27 (×3): qty 1

## 2023-07-27 MED ORDER — ASPIRIN 81 MG PO CHEW
324.0000 mg | CHEWABLE_TABLET | Freq: Once | ORAL | Status: AC
  Administered 2023-07-27: 324 mg via ORAL
  Filled 2023-07-27: qty 4

## 2023-07-27 MED ORDER — HEPARIN (PORCINE) 25000 UT/250ML-% IV SOLN
1100.0000 [IU]/h | INTRAVENOUS | Status: DC
  Filled 2023-07-27: qty 250

## 2023-07-27 MED ORDER — HALOPERIDOL LACTATE 5 MG/ML IJ SOLN
5.0000 mg | INTRAMUSCULAR | Status: AC
  Administered 2023-07-27: 5 mg via INTRAMUSCULAR
  Filled 2023-07-27: qty 1

## 2023-07-27 MED ORDER — HEPARIN BOLUS VIA INFUSION
4000.0000 [IU] | Freq: Once | INTRAVENOUS | Status: DC
  Filled 2023-07-27: qty 4000

## 2023-07-27 MED ORDER — COLCHICINE 0.6 MG PO TABS
0.6000 mg | ORAL_TABLET | Freq: Two times a day (BID) | ORAL | Status: DC
  Administered 2023-07-28 – 2023-07-29 (×3): 0.6 mg via ORAL
  Filled 2023-07-27 (×3): qty 1

## 2023-07-27 MED ORDER — PREGABALIN 50 MG PO CAPS
100.0000 mg | ORAL_CAPSULE | Freq: Two times a day (BID) | ORAL | Status: DC
  Administered 2023-07-27 – 2023-07-29 (×4): 100 mg via ORAL
  Filled 2023-07-27 (×4): qty 2

## 2023-07-27 MED ORDER — PANTOPRAZOLE SODIUM 40 MG PO TBEC
40.0000 mg | DELAYED_RELEASE_TABLET | Freq: Every day | ORAL | Status: DC
  Administered 2023-07-27 – 2023-07-29 (×3): 40 mg via ORAL
  Filled 2023-07-27 (×3): qty 1

## 2023-07-27 MED ORDER — ONDANSETRON HCL 4 MG/2ML IJ SOLN: 4.0000 mg | Freq: Four times a day (QID) | INTRAMUSCULAR | Status: DC | PRN

## 2023-07-27 MED ORDER — ACETAMINOPHEN 650 MG RE SUPP: 650.0000 mg | Freq: Four times a day (QID) | RECTAL | Status: DC | PRN

## 2023-07-27 MED ORDER — LAMOTRIGINE 100 MG PO TABS
200.0000 mg | ORAL_TABLET | Freq: Every day | ORAL | Status: DC
  Administered 2023-07-27 – 2023-07-28 (×2): 200 mg via ORAL
  Filled 2023-07-27 (×2): qty 2

## 2023-07-27 MED ORDER — ACETAMINOPHEN 325 MG PO TABS
650.0000 mg | ORAL_TABLET | Freq: Four times a day (QID) | ORAL | Status: DC | PRN
  Filled 2023-07-27: qty 2

## 2023-07-27 MED ORDER — ONDANSETRON HCL 4 MG PO TABS: 4.0000 mg | ORAL_TABLET | Freq: Four times a day (QID) | ORAL | Status: DC | PRN

## 2023-07-27 NOTE — ED Provider Notes (Signed)
 [place] EMERGENCY DEPARTMENT AT [facility] Provider Note   CSN: [id number] Arrival date & time: 07/27/23  1212     History {Add pertinent medical, surgical, social history, OB history to HPI:1} Chief Complaint  Patient presents with   Fall   Altered Mental Status    Gabriel Richardson is a 78 y.o. male.  78 year old male with history of Alzheimer's disease, bipolar, and asthma who presents to the emergency department with a fall.  Patient was found by a neighbor who is out of town when he went to check on the patient.  Patient was found on the ground and he was telling the neighbor that he had been there for 2 days.  Tells me that he fell 2 days ago after syncopal event.  Did hit his head.  Has been having bilateral hip pain.  When EMS arrived his apartment was well-kept.  Was not covered in urine.  Patient did say that he saw some rats on the ground which were not apparent by EMS.  He is telling me that he sees bugs on the ceiling that are not present right now.  Lives at home by himself.  Not on blood thinners. Denies etoh or substance use. Unable to reach wife Gabriel Richardson for additional details. Pt says they are not together anymore.        Home Medications Prior to Admission medications   Medication Sig Start Date End Date Taking? Authorizing Provider  acetaminophen (TYLENOL) 500 MG tablet Take 1,000 mg by mouth every 6 (six) hours as needed for mild pain or headache.    [provider]  cyclobenzaprine (FLEXERIL) 10 MG tablet Take 10 mg by mouth 3 (three) times daily. 12/17/22   [provider]  lamoTRIgine (LAMICTAL) 200 MG tablet Take 200 mg by mouth at bedtime. 01/18/15   [provider]  memantine (NAMENDA) 10 MG tablet Take 1 tablet twice daily. 04/29/23   Patel, Roxana Hires K, DO  oxyCODONE-acetaminophen (PERCOCET) 10-325 MG tablet Take 1 tablet by mouth 2 (two) times daily as needed. 12/08/22   [provider]  pregabalin (LYRICA) 100 MG  capsule TAKE 1 CAPSULE BY MOUTH 2 TIMES A DAY 07/09/23   Patel, Donika K, DO  primidone (MYSOLINE) 50 MG tablet TAKE 2 TABLETS BY MOUTH TWICE A DAY 03/31/23   Patel, Donika K, DO  propranolol ER (INDERAL LA) 120 MG 24 hr capsule TAKE 1 CAPSULE BY MOUTH TWICE A DAY OK TO TAKE EXTRA CAPSULE AS NEEDED FOR WORSENING TREMOR 05/29/23   Nita Sickle K, DO  sildenafil (VIAGRA) 100 MG tablet Take 1 tablet (100 mg total) by mouth daily as needed for erectile dysfunction. 03/08/15   Collene Gobble, MD  zolpidem (AMBIEN) 10 MG tablet Take 10 mg by mouth at bedtime. Takes 12.5 mg at bedtime 07/19/15   [provider]      Allergies    Patient has no known allergies.    Review of Systems   Review of Systems  Physical Exam Updated Vital Signs BP 125/88 (BP Location: Right Arm)   Pulse 78   Temp 98.3 F (36.8 C) (Oral)   Resp 17   Ht 5\' 10"  (1.778 m)   Wt 89.8 kg   SpO2 100%   BMI 28.41 kg/m  Physical Exam Vitals and nursing note reviewed.  Constitutional:      General: He is not in acute distress.    Appearance: He is well-developed.     Comments: Alert and  oriented to person, place, and the fact that the year is 2025  HENT:     Head: Normocephalic and atraumatic.     Right Ear: External ear normal.     Left Ear: External ear normal.     Nose: Nose normal.  Eyes:     Extraocular Movements: Extraocular movements intact.     Conjunctiva/sclera: Conjunctivae normal.     Pupils: Pupils are equal, round, and reactive to light.  Cardiovascular:     Rate and Rhythm: Normal rate and regular rhythm.     Heart sounds: Normal heart sounds.  Pulmonary:     Effort: Pulmonary effort is normal. No respiratory distress.     Breath sounds: Normal breath sounds.  Abdominal:     General: There is no distension.     Palpations: Abdomen is soft. There is no mass.     Tenderness: There is no abdominal tenderness. There is no guarding.  Musculoskeletal:     Cervical back: Normal range of motion and  neck supple.     Right lower leg: No edema.     Left lower leg: No edema.     Comments: No significant tenderness to palpation of bilateral shoulders, elbows, or wrists.  Tenderness palpation of bilateral hips but no deformities noted.  Tenderness to palpation of bilateral knees and ankles.  No tenderness to palpation of cervical, thoracic, or lumbar spine.  No pressure wounds noted anteriorly or posteriorly after rolling the patient.  Skin:    General: Skin is warm and dry.  Neurological:     Mental Status: He is alert. Mental status is at baseline.     Cranial Nerves: No cranial nerve deficit.     Sensory: No sensory deficit.     Motor: No weakness.  Psychiatric:        Mood and Affect: Mood normal.        Behavior: Behavior normal.     ED Results / Procedures / Treatments   Labs (all labs ordered are listed, but only abnormal results are displayed) Labs Reviewed  COMPREHENSIVE METABOLIC PANEL  CBC  URINALYSIS, ROUTINE W REFLEX MICROSCOPIC  CK  ETHANOL  RAPID URINE DRUG SCREEN, HOSP PERFORMED  I-STAT CHEM 8, ED  TROPONIN I (HIGH SENSITIVITY)    EKG None  Radiology No results found.  Procedures Procedures  {Document cardiac monitor, telemetry assessment procedure when appropriate:1}  Medications Ordered in ED Medications - No data to display  ED Course/ Medical Decision Making/ A&P Clinical Course as of 07/27/23 2022  Wynelle Link Jul 27, 2023  1617 Discussed presentation and EKG with Dr Wyline Mood. Can stay at [facility] long. Will start on heparin drip. Feels presentation is more consistent with myopericarditis than STEMI.  [RP]  1634 Discussed with Dr Jacinto Halim who agrees it is likely myocarditis [RP]  1717 Dr Avie Arenas to admit.  [RP]    Clinical Course User Index [RP] Rondel Baton, MD   {   Click here for ABCD2, HEART and other calculatorsREFRESH Note before signing :1}                              Medical Decision Making Amount and/or Complexity of Data Reviewed Labs:  ordered. Radiology: ordered.  Risk OTC drugs. Prescription drug management. Decision regarding hospitalization.   Gabriel Richardson is a 78 y.o. male with comorbidities that complicate the patient evaluation including Alzheimer's disease, bipolar, and asthma who presents to the emergency  department with a fall.   Initial Ddx:  ***   MDM/Course:  *** Upon re-evaluation ***  This patient presents to the ED for concern of complaints listed in HPI, this involves an extensive number of treatment options, and is a complaint that carries with it a high risk of complications and morbidity. Disposition including potential need for admission considered.   Dispo: {Disposition:28069}  Additional history obtained from {Additional History:28067} Records reviewed {Records Reviewed:28068} The following labs were independently interpreted: {labs interpreted:28064} and show {lab findings:28250} I independently reviewed the following imaging with scope of interpretation limited to determining acute life threatening conditions related to emergency care: {imaging interpreted:28065} and agree with the radiologist interpretation with the following exceptions: none I personally reviewed and interpreted cardiac monitoring: {cardiac monitoring:28251} I personally reviewed and interpreted the pt's EKG: see above for interpretation  I have reviewed the patients home medications and made adjustments as needed Consults: {Consultants:28063} Social Determinants of health:  ***  Portions of this note were generated with Scientist, clinical (histocompatibility and immunogenetics). Dictation errors may occur despite best attempts at proofreading.    {Document your decision making why or why not admission, treatments were needed:1} Final Clinical Impression(s) / ED Diagnoses Final diagnoses:  None    Rx / DC Orders ED Discharge Orders     None

## 2023-07-27 NOTE — ED Notes (Signed)
 This RN called 4W to inform that patient was being transported to inpatient room, 4W confirmed and no questions at this time.

## 2023-07-27 NOTE — ED Notes (Signed)
 Patient transported to CT

## 2023-07-27 NOTE — H&P (Signed)
 History and Physical    Gabriel Richardson UJW:[id number] DOB: Jan 24, 1946 DOA: 07/27/2023  PCP: Gweneth Dimitri, MD   Chief Complaint:  syncope  HPI: Gabriel Richardson is a 78 y.o. male with medical history significant of bipolar, COPD, dementia who presents manage department after syncopal event.  Patient's neighbor reportedly found him on the floor and had not taken his medications.  He was hallucinating and confused.  He was brought to the emergency department where he was found to be afebrile and hemodynamically stable.  He was unable to provide much history.  Labs were obtained which showed urinalysis negative for infection, sodium 135, potassium 3.8, creatinine 0.52, AST 59, ALT 20, WBC 9.2, hemoglobin 13.8, platelets 198, CK3 198, alcohol level undetectable, troponin 4157, 3859.  UDS was positive for barbiturates.  Due to elevated troponin cardiology was consulted.  EKG was reviewed which revealed concern for myopericarditis.  Inflammatory labs were ordered and patient was admitted for further workup.  His heparin drip was discontinued and he was placed on colchicine and ibuprofen.   Review of Systems: Review of Systems  Constitutional:  Negative for chills and fever.  HENT: Negative.    Eyes: Negative.   Respiratory: Negative.    Cardiovascular: Negative.  Negative for chest pain and palpitations.  Gastrointestinal: Negative.   Genitourinary: Negative.   Musculoskeletal: Negative.   Skin: Negative.   Psychiatric/Behavioral:  Positive for hallucinations and memory loss.      As per HPI otherwise 10 point review of systems negative.   No Known Allergies  Past Medical History:  Diagnosis Date   Ankle fracture    right with syndesmotic disruption   Anxiety    Arthritis    Asthma    Bipolar disorder (HCC)    Cervical stenosis of spinal canal    Closed fracture of right distal fibula 01/07/2019   COPD (chronic obstructive pulmonary disease) (HCC)    Depression    Emphysema of lung  (HCC)    Neuromuscular disorder (HCC)    Trigeminal neuralgia    Wears glasses     Past Surgical History:  Procedure Laterality Date   CERVICAL FUSION     COLONOSCOPY  10 years ago    At [facility]normal exam"   LUMBAR LAMINECTOMY     ORIF ANKLE FRACTURE Right 01/07/2019   Procedure: Right ankle open reduction internal fixation with syndesmosis fixation;  Surgeon: Yolonda Kida, MD;  Location: [facility] OR;  Service: Orthopedics;  Laterality: Right;  90 mins   SPINE SURGERY     Cervical C3-C5 anterior fusion   THORACIC DISCECTOMY N/A 08/08/2020   Procedure: Thoracic two- thoracic three Laminectomy;  Surgeon: Maeola Harman, MD;  Location: [facility] OR;  Service: Neurosurgery;  Laterality: N/A;     reports that he quit smoking about 26 years ago. His smoking use included cigarettes. He started smoking about 51 years ago. He has a 37.5 pack-year smoking history. He has never used smokeless tobacco. He reports current alcohol use of about 2.0 standard drinks of alcohol per week. He reports that he does not use drugs.  Family History  Problem Relation Age of Onset   Kidney disease Mother    Breast cancer Mother        Living, 2   Diabetes Mellitus II Father        Deceased, 19   Tremor Father    Depression Daughter    Healthy Brother    Colon cancer Neg Hx     Prior  to Admission medications   Medication Sig Start Date End Date Taking? Authorizing Provider  zolpidem (AMBIEN CR) 12.5 MG CR tablet Take 12.5 mg by mouth at bedtime as needed for sleep.   Yes [provider]  acetaminophen (TYLENOL) 500 MG tablet Take 1,000 mg by mouth every 6 (six) hours as needed for mild pain or headache.    [provider]  cyclobenzaprine (FLEXERIL) 10 MG tablet Take 10 mg by mouth 3 (three) times daily. 12/17/22   [provider]  furosemide (LASIX) 40 MG tablet Take 40 mg by mouth daily. 06/02/23   [provider]  lamoTRIgine (LAMICTAL) 200 MG tablet Take 200  mg by mouth at bedtime. 01/18/15   [provider]  memantine (NAMENDA) 10 MG tablet Take 1 tablet twice daily. 04/29/23   Patel, Roxana Hires K, DO  oxyCODONE-acetaminophen (PERCOCET) 10-325 MG tablet Take 1 tablet by mouth 2 (two) times daily as needed. 12/08/22   [provider]  pregabalin (LYRICA) 100 MG capsule TAKE 1 CAPSULE BY MOUTH 2 TIMES A DAY 07/09/23   Patel, Donika K, DO  primidone (MYSOLINE) 50 MG tablet TAKE 2 TABLETS BY MOUTH TWICE A DAY 03/31/23   Patel, Donika K, DO  propranolol ER (INDERAL LA) 120 MG 24 hr capsule TAKE 1 CAPSULE BY MOUTH TWICE A DAY OK TO TAKE EXTRA CAPSULE AS NEEDED FOR WORSENING TREMOR 05/29/23   Nita Sickle K, DO  sildenafil (VIAGRA) 100 MG tablet Take 1 tablet (100 mg total) by mouth daily as needed for erectile dysfunction. 03/08/15   Collene Gobble, MD    Physical Exam: Vitals:   07/27/23 1644 07/27/23 1918 07/27/23 2020 07/27/23 2033  BP:  108/75 109/80   Pulse:  85 81   Resp:  16 19   Temp: 98.1 F (36.7 C) 98 F (36.7 C) 98.5 F (36.9 C)   TempSrc: Oral Oral Oral   SpO2:  95% 100%   Weight:    80 kg  Height:    5\' 10"  (1.778 m)   Physical Exam Constitutional:      Appearance: He is ill-appearing.  HENT:     Head: Normocephalic.     Nose: Nose normal.     Mouth/Throat:     Mouth: Mucous membranes are moist.     Pharynx: Oropharynx is clear.  Eyes:     Conjunctiva/sclera: Conjunctivae normal.     Pupils: Pupils are equal, round, and reactive to light.  Cardiovascular:     Rate and Rhythm: Normal rate and regular rhythm.     Pulses: Normal pulses.     Heart sounds: Normal heart sounds.  Pulmonary:     Effort: Pulmonary effort is normal.     Breath sounds: Normal breath sounds.  Abdominal:     General: Abdomen is flat. Bowel sounds are normal.  Musculoskeletal:        General: Normal range of motion.     Cervical back: Normal range of motion.  Skin:    General: Skin is warm.     Capillary Refill: Capillary refill  takes less than 2 seconds.  Neurological:     General: No focal deficit present.     Mental Status: He is disoriented.  Psychiatric:        Mood and Affect: Mood normal.       Labs on Admission: I have personally reviewed the patients's labs and imaging studies.  Assessment/Plan Principal Problem:   Syncope   # Reported syncopal episode #  Acute encephalopathy #Elevated troponin and EKG changes concerning for myopericarditis - Patient's neighbor found down where he was altered and confused labs revealed elevated troponin - Cardiology consulted regarding concern for ACS and reviewed EKG with concern for myopericarditis.  Low suspicion for acute coronary syndrome  Plan: Obtain echocardiogram per cardiology recommendations Start colchicine 0.6 mg twice daily Start ibuprofen 800 mg twice daily  # Mood disorder #Essential tremor # Cognitive impairment - Continue home Lamictal, Namenda  -Continue primidone  #insomnia-continue home Ambien   Admission status: Inpatient Telemetry  Certification: The appropriate patient status for this patient is INPATIENT. Inpatient status is judged to be reasonable and necessary in order to provide the required intensity of service to ensure the patient's safety. The patient's presenting symptoms, physical exam findings, and initial radiographic and laboratory data in the context of their chronic comorbidities is felt to place them at high risk for further clinical deterioration. Furthermore, it is not anticipated that the patient will be medically stable for discharge from the hospital within 2 midnights of admission.   * I certify that at the point of admission it is my clinical judgment that the patient will require inpatient hospital care spanning beyond 2 midnights from the point of admission due to high intensity of service, high risk for further deterioration and high frequency of surveillance required.Alan Mulder MD [facility] If 7PM-7AM, please contact night-coverage [email or web address]  07/27/2023, 8:55 PM

## 2023-07-27 NOTE — Progress Notes (Signed)
 PHARMACY - ANTICOAGULATION CONSULT NOTE  Pharmacy Consult for Heparin Indication: chest pain/ACS  No Known Allergies  Patient Measurements: Height: 5\' 10"  (177.8 cm) Weight: 89.8 kg (197 lb 15.6 oz) IBW/kg (Calculated) : 73 Heparin Dosing Weight: 89.8 kg  Vital Signs: Temp: 98.3 F (36.8 C) (03/09 1221) Temp Source: Oral (03/09 1221) BP: 107/76 (03/09 1502) Pulse Rate: 82 (03/09 1502)  Labs: Recent Labs    07/27/23 1358 07/27/23 1405  HGB 13.8 14.3  HCT 41.3 42.0  PLT 190  --   CREATININE 0.52* 0.60*  CKTOTAL 398*  --   TROPONINIHS 4,157*  --     Estimated Creatinine Clearance: 89.9 mL/min (A) (by C-G formula based on SCr of 0.6 mg/dL (L)).   Medical History: Past Medical History:  Diagnosis Date   Ankle fracture    right with syndesmotic disruption   Anxiety    Arthritis    Asthma    Bipolar disorder (HCC)    Cervical stenosis of spinal canal    Closed fracture of right distal fibula 01/07/2019   COPD (chronic obstructive pulmonary disease) (HCC)    Depression    Emphysema of lung (HCC)    Neuromuscular disorder (HCC)    Trigeminal neuralgia    Wears glasses    Assessment: 78 YO male found down by his neighbor and reportedly has not taken his medications for ~1 week. Initial troponin 4157 >> 3859, EKG showing ST elevation. Patient not on [facility] PTA. Pharmacy consulted for heparin dosing in setting of ACS.  Today, 07/27/23: Hgb 13.8 - stable Plts 190 - stable  Scr 0.60, CrCl ~90 ml/min  No s/sx of bleeding documented   Goal of Therapy:  Heparin level 0.3-0.7 units/ml Monitor platelets by anticoagulation protocol: Yes   Plan:  Give heparin 4000 units IV x1 Start heparin gtt @1100  units/hr  Check heparin level 8hrs after infusion starts Monitor heparin level, CBC, and s/sx of bleeding daily   Cherylin Mylar, PharmD Clinical Pharmacist  3/9/20254:31 PM

## 2023-07-27 NOTE — Progress Notes (Signed)
 Pt is confused, cannot finished the admission questions at this time.

## 2023-07-27 NOTE — ED Notes (Signed)
 Pt says he knows who the "bad guys" are.... "[facility] and the radiology department."

## 2023-07-27 NOTE — Progress Notes (Addendum)
 Patient discussed with ER staff. Presented with syncope. Found to have elevated troponin initial 4157, trending down. EKG with diffuse ST elevations, PR depression and aVR PR elevation suggesting perimyocarditis. ER staff had also discussed with interventional who agreed not STEMI. Patient advanced age with dementia, from neuro note suspect Alzheimers. Confused and active hallucinationsin ER, in general poor cath candidate.   Suspect perimyocarditis, f/u echo results. Start colchicine 0.6mg  bid, ibuprofen 800mg  bid (somewhat conservative dosing given age). Ok for admission at [facility]. Initially started heparin, based on EKG findings will d/c due to risks for hemorrhagic effusion. Full cardiology consult in the AM. Consider viral panel.   Nursing has notified me he is refusing to take oral medications and has pulled out his IV, we had written for colchicine and ibuprofen.    Dina Rich MD

## 2023-07-27 NOTE — ED Notes (Signed)
 Pt refused Heparin. Pt proceeded to rip off cardiac monitor stickers and his gown. Pt then reached down and ripped out his IV. MD notified.

## 2023-07-27 NOTE — ED Notes (Signed)
 Pt transported to floor with security.

## 2023-07-27 NOTE — ED Triage Notes (Addendum)
 Pt is coming from home. Neighbor who occasionally checks on him found him on the floor. Pt says he was on the floor for 2 days but EMS said there was no urine. Visual hallucinations present with EMS. Pt was seeing rats. Hasn't taken meds all week. Has no one that takes care of him on a regular basis. His book Biomedical engineer, housekeeper and neighbor are the only people who check in on him rarely. Neighbor was on vacation for a week and said he sees a rapid decline when he got back in the pt.

## 2023-07-28 ENCOUNTER — Inpatient Hospital Stay (HOSPITAL_COMMUNITY)

## 2023-07-28 ENCOUNTER — Encounter (HOSPITAL_COMMUNITY): Payer: Self-pay | Admitting: Internal Medicine

## 2023-07-28 DIAGNOSIS — R55 Syncope and collapse: Secondary | ICD-10-CM

## 2023-07-28 DIAGNOSIS — I319 Disease of pericardium, unspecified: Secondary | ICD-10-CM | POA: Diagnosis not present

## 2023-07-28 LAB — CBC
HCT: 38.6 % — ABNORMAL LOW (ref 39.0–52.0)
Hemoglobin: 13.1 g/dL (ref 13.0–17.0)
MCH: 34 pg (ref 26.0–34.0)
MCHC: 33.9 g/dL (ref 30.0–36.0)
MCV: 100.3 fL — ABNORMAL HIGH (ref 80.0–100.0)
Platelets: 184 10*3/uL (ref 150–400)
RBC: 3.85 MIL/uL — ABNORMAL LOW (ref 4.22–5.81)
RDW: 14 % (ref 11.5–15.5)
WBC: 7.3 10*3/uL (ref 4.0–10.5)
nRBC: 0 % (ref 0.0–0.2)

## 2023-07-28 LAB — TROPONIN I (HIGH SENSITIVITY): Troponin I (High Sensitivity): 2012 ng/L (ref <18)

## 2023-07-28 LAB — ECHOCARDIOGRAM COMPLETE
Height: 70 in
S' Lateral: 2.8 cm
Weight: 2821.89 [oz_av]

## 2023-07-28 NOTE — Plan of Care (Signed)

## 2023-07-28 NOTE — Consult Note (Addendum)
 Cardiology Consultation   Patient ID: Gabriel Richardson MRN: [id number]; DOB: 1945-09-29  Admit date: 07/27/2023 Date of Consult: 07/28/2023  PCP:  Gweneth Dimitri, MD   [facility] Providers Cardiologist:  None        Patient Profile:   Gabriel Richardson is a 78 y.o. male with a hx of bipolar disorder, COPD, dementia who is being seen 07/28/2023 for the evaluation of elevated troponin at the request of Dr. Avie Arenas.  History of Present Illness:   Gabriel Richardson presented to [facility] ED with EMS after being found on the floor at home, acutely confused and hallucinating. Labs showed no clear infection, UA without evidence of UTI. Troponin elevated at 4157 ->3859. Cardiology was called yesterday evening given elevated troponin and ECG with diffuse ST elevation with PR depression. Clinical suspicion was high for myocarditis. Cardiology recommended colchicine, ibuprofen, heparin with full consult today. Notes indicate that patient continued to have agitation/hallucinations in the ED. Heparin was discontinued after patient took out his IV.   This morning, patient no longer agitated. Is pleasant on exam and able to answer ROS questions. He has no chest pain, shortness of breath this morning. Denies feeling feverish or ill. His recollection of events preceding his admission is very limited.    Past Medical History:  Diagnosis Date   Ankle fracture    right with syndesmotic disruption   Anxiety    Arthritis    Asthma    Bipolar disorder (HCC)    Cervical stenosis of spinal canal    Closed fracture of right distal fibula 01/07/2019   COPD (chronic obstructive pulmonary disease) (HCC)    Depression    Emphysema of lung (HCC)    Neuromuscular disorder (HCC)    Trigeminal neuralgia    Wears glasses     Past Surgical History:  Procedure Laterality Date   CERVICAL FUSION     COLONOSCOPY  10 years ago    At [facility] exam"   LUMBAR LAMINECTOMY     ORIF ANKLE FRACTURE Right  01/07/2019   Procedure: Right ankle open reduction internal fixation with syndesmosis fixation;  Surgeon: Yolonda Kida, MD;  Location: [facility] OR;  Service: Orthopedics;  Laterality: Right;  90 mins   SPINE SURGERY     Cervical C3-C5 anterior fusion   THORACIC DISCECTOMY N/A 08/08/2020   Procedure: Thoracic two- thoracic three Laminectomy;  Surgeon: Maeola Harman, MD;  Location: [facility] OR;  Service: Neurosurgery;  Laterality: N/A;     Home Medications:  Prior to Admission medications   Medication Sig Start Date End Date Taking? Authorizing Provider  zolpidem (AMBIEN CR) 12.5 MG CR tablet Take 12.5 mg by mouth at bedtime as needed for sleep.   Yes [provider]  acetaminophen (TYLENOL) 500 MG tablet Take 1,000 mg by mouth every 6 (six) hours as needed for mild pain or headache.    [provider]  cyclobenzaprine (FLEXERIL) 10 MG tablet Take 10 mg by mouth 3 (three) times daily. 12/17/22   [provider]  furosemide (LASIX) 40 MG tablet Take 40 mg by mouth daily. 06/02/23   [provider]  lamoTRIgine (LAMICTAL) 200 MG tablet Take 200 mg by mouth at bedtime. 01/18/15   [provider]  memantine (NAMENDA) 10 MG tablet Take 1 tablet twice daily. 04/29/23   Patel, Roxana Hires K, DO  oxyCODONE-acetaminophen (PERCOCET) 10-325 MG tablet Take 1 tablet by mouth 2 (two) times daily as needed. 12/08/22   [provider]  pregabalin (LYRICA) 100 MG capsule TAKE 1 CAPSULE BY MOUTH 2 TIMES A DAY 07/09/23   Patel, Donika K, DO  primidone (MYSOLINE) 50 MG tablet TAKE 2 TABLETS BY MOUTH TWICE A DAY 03/31/23   Patel, Donika K, DO  propranolol ER (INDERAL LA) 120 MG 24 hr capsule TAKE 1 CAPSULE BY MOUTH TWICE A DAY OK TO TAKE EXTRA CAPSULE AS NEEDED FOR WORSENING TREMOR 05/29/23   Nita Sickle K, DO  sildenafil (VIAGRA) 100 MG tablet Take 1 tablet (100 mg total) by mouth daily as needed for erectile dysfunction. 03/08/15   Collene Gobble, MD    Inpatient  Medications: Scheduled Meds:  colchicine  0.6 mg Oral BID   ibuprofen  800 mg Oral BID   lamoTRIgine  200 mg Oral QHS   memantine  10 mg Oral BID   pantoprazole  40 mg Oral Daily   pregabalin  100 mg Oral BID   primidone  50 mg Oral Q12H   zolpidem  10 mg Oral QHS   Continuous Infusions:  PRN Meds: acetaminophen **OR** acetaminophen, ondansetron **OR** ondansetron (ZOFRAN) IV  Allergies:   No Known Allergies  Social History:   Social History   Socioeconomic History   Marital status: Married    Spouse name: [name]   Number of children: 2   Years of education: BS   Highest education level: Not on file  Occupational History    Employer: [employer]     Comment: Works for himself  Tobacco Use   Smoking status: Former    Current packs/day: 0.00    Average packs/day: 1.5 packs/day for 25.0 years (37.5 ttl pk-yrs)    Types: Cigarettes    Start date: 05/20/1972    Quit date: 05/20/1997    Years since quitting: 26.2   Smokeless tobacco: Never  Vaping Use   Vaping status: Never Used  Substance and Sexual Activity   Alcohol use: Yes    Alcohol/week: 2.0 standard drinks of alcohol    Types: 2 Shots of liquor per week    Comment: 2 glasses of whiskey   Drug use: No   Sexual activity: Yes    Birth control/protection: None  Other Topics Concern   Not on file  Social History Narrative   He currently works and Psychologist, forensic.    One story      Patient lives at home with his wife Gabriel Richardson in a one-story home.  Patient has a college education. B.S. Patient is self employed.  They have two grown daughters.   Right handed.   Caffeine- one cup   Social Drivers of Health   Financial Resource Strain: Low Risk  (09/10/2022)   Received from [facility], [facility]   Overall Financial Resource Strain (CARDIA)    Difficulty of Paying Living Expenses: Not very hard  Food Insecurity: No Food Insecurity (09/10/2022)   Received from [facility], [facility]   Hunger  Vital Sign    Worried About Running Out of Food in the Last Year: Never true    Ran Out of Food in the Last Year: Never true  Transportation Needs: Patient Unable To Answer (07/27/2023)   PRAPARE - Transportation    Lack of Transportation (Medical): Patient unable to answer    Lack of Transportation (Non-Medical): Patient unable to answer  Physical Activity: Not on file  Stress: Not on file  Social Connections: Unknown (09/17/2021)   Received from [facility], [facility]   Social Network  Social Network: Not on file  Intimate Partner Violence: Unknown (09/11/2021)   Received from [facility], [facility]   HITS    Physically Hurt: Not on file    Insult or Talk Down To: Not on file    Threaten Physical Harm: Not on file    Scream or Curse: Not on file    Family History:    Family History  Problem Relation Age of Onset   Kidney disease Mother    Breast cancer Mother        Living, 70   Diabetes Mellitus II Father        Deceased, 56   Tremor Father    Depression Daughter    Healthy Brother    Colon cancer Neg Hx      ROS:  Please see the history of present illness.   All other ROS reviewed and negative.     Physical Exam/Data:   Vitals:   07/28/23 0030 07/28/23 0100 07/28/23 0437 07/28/23 0835  BP: 104/70  101/75 101/67  Pulse: 89  93 92  Resp:  15 16 17   Temp: 98.1 F (36.7 C)  98.3 F (36.8 C) 98 F (36.7 C)  TempSrc: Axillary  Axillary Axillary  SpO2: 100%  98% 96%  Weight:      Height:        Intake/Output Summary (Last 24 hours) at 07/28/2023 1011 Last data filed at 07/28/2023 0641 Gross per 24 hour  Intake --  Output 300 ml  Net -300 ml      07/27/2023    8:33 PM 07/27/2023   12:26 PM 03/18/2023    3:48 PM  Last 3 Weights  Weight (lbs) 176 lb 5.9 oz 197 lb 15.6 oz 198 lb  Weight (kg) 80 kg 89.8 kg 89.812 kg     Body mass index is 25.31 kg/m.  General:  Well nourished, well developed, in no acute distress HEENT: normal Neck: no  JVD Vascular: No carotid bruits; Distal pulses 2+ bilaterally Cardiac:  normal S1, S2; RRR; no murmur  Lungs:  clear to auscultation bilaterally, no wheezing, rhonchi or rales  Abd: soft, nontender, no hepatomegaly  Ext: no edema Musculoskeletal:  No deformities, BUE and BLE strength normal and equal Skin: warm and dry  Neuro:  CNs 2-12 intact, no focal abnormalities noted Psych: Pleasantly confused   EKG:  The EKG was personally reviewed and demonstrates:  diffuse ST elevation, sinus rhythm Telemetry:  Telemetry was personally reviewed and demonstrates: sinus rhythm  Relevant CV Studies: Echo pending  Laboratory Data:  High Sensitivity Troponin:   Recent Labs  Lab 07/27/23 1358 07/27/23 1543  TROPONINIHS 4,157* 3,859*     Chemistry Recent Labs  Lab 07/27/23 1358 07/27/23 1405  NA 135 137  K 3.8 3.6  CL 99 99  CO2 21*  --   GLUCOSE 99 98  BUN 9 7*  CREATININE 0.52* 0.60*  CALCIUM 8.5*  --   GFRNONAA >60  --   ANIONGAP 15  --     Recent Labs  Lab 07/27/23 1358  PROT 7.2  ALBUMIN 3.3*  AST 59*  ALT 20  ALKPHOS 88  BILITOT 2.2*   Lipids No results for input(s): "CHOL", "TRIG", "HDL", "LABVLDL", "LDLCALC", "CHOLHDL" in the last 168 hours.  Hematology Recent Labs  Lab 07/27/23 1358 07/27/23 1405 07/28/23 0329  WBC 9.2  --  7.3  RBC 4.11*  --  3.85*  HGB 13.8 14.3 13.1  HCT 41.3 42.0 38.6*  MCV  100.5*  --  100.3*  MCH 33.6  --  34.0  MCHC 33.4  --  33.9  RDW 13.7  --  14.0  PLT 190  --  184   Thyroid No results for input(s): "TSH", "FREET4" in the last 168 hours.  BNPNo results for input(s): "BNP", "PROBNP" in the last 168 hours.  DDimer No results for input(s): "DDIMER" in the last 168 hours.   Radiology/Studies:  DG Ribs Unilateral W/Chest Left Result Date: 07/27/2023 CLINICAL DATA:  Left-sided chest pain.  Unwitnessed fall. EXAM: LEFT RIBS AND CHEST - 3+ VIEW COMPARISON:  March 18, 2023. FINDINGS: Old left rib fractures are noted. No acute  fracture is noted. There is no evidence of pneumothorax or pleural effusion. Both lungs are clear. Heart size and mediastinal contours are within normal limits. IMPRESSION: No acute left rib fractures are noted. Electronically Signed   By: Lupita Raider M.D.   On: 07/27/2023 14:03   CT Cervical Spine Wo Contrast Result Date: 07/27/2023 CLINICAL DATA:  78 year old male found down, may have fallen 2 days earlier. Previous C1 and C2 vertebral fractures. EXAM: CT CERVICAL SPINE WITHOUT CONTRAST TECHNIQUE: Multidetector CT imaging of the cervical spine was performed without intravenous contrast. Multiplanar CT image reconstructions were also generated. RADIATION DOSE REDUCTION: This exam was performed according to the departmental dose-optimization program which includes automated exposure control, adjustment of the mA and/or kV according to patient size and/or use of iterative reconstruction technique. COMPARISON:  Cervical spine CT 03/18/2023 and earlier. FINDINGS: Alignment: Stable since October. Stable cervicothoracic junction and posterior element alignment. Skull base and vertebrae: Visualized skull base is intact. No atlanto-occipital dissociation. Un healed bilateral C1 ring fractures remain nondisplaced on series 11, image 21. Chronically unhealed type 2 odontoid fracture with stable mild retrolisthesis (sagittal image 46). Distraction of those fracture fragments now is 4 mm, increased by 1 mm since October. Superimposed chronic cervical spine surgery. 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 No acute osseous abnormality identified. Soft tissues and spinal canal: No prevertebral fluid or swelling. No visible canal hematoma. Calcified cervical carotid atherosclerosis, bulky on the right. Disc levels: ACDF hardware remains in place at C3-C4. Ongoing suspicion of pseudoarthrosis there although no definite hardware loosening. Chronic spondylolisthesis at C7-T1 with bulky facet and endplate  spurring. Conspicuous posterior longitudinal ligament and ligament flavum hypertrophy at the C1-C2 level resulting in mild spinal stenosis there, but not significantly changed from September last year. Upper chest: Upper thoracic ankylosis but not at T1-T2 with chronic vacuum disc there. Negative visible lung apices. IMPRESSION: 1. Chronically unhealed C1 and C2 fractures. Minimal (1 mm) additional distraction of the odontoid fragment since October. 2. No new fracture or acute osseous abnormality in the cervical spine. 3. 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 4. Partially visible upper thoracic spinal ankylosis. Electronically Signed   By: Odessa Fleming M.D.   On: 07/27/2023 14:02   DG Pelvis Portable Result Date: 07/27/2023 CLINICAL DATA:  Unwitnessed fall. EXAM: PORTABLE PELVIS 1-2 VIEWS COMPARISON:  July 03, 2020. FINDINGS: There is no evidence of pelvic fracture or diastasis. No pelvic bone lesions are seen. IMPRESSION: Negative. Electronically Signed   By: Lupita Raider M.D.   On: 07/27/2023 14:01   CT Head Wo Contrast Result Date: 07/27/2023 CLINICAL DATA:  78 year old male found down, may have fallen 2 days earlier. EXAM: CT HEAD WITHOUT CONTRAST TECHNIQUE: Contiguous axial images were  obtained from the base of the skull through the vertex without intravenous contrast. RADIATION DOSE REDUCTION: This exam was performed according to the departmental dose-optimization program which includes automated exposure control, adjustment of the mA and/or kV according to patient size and/or use of iterative reconstruction technique. COMPARISON:  CT head and cervical spine 03/18/2023. FINDINGS: Brain: Stable cerebral volume. No midline shift, ventriculomegaly, mass effect, evidence of mass lesion, intracranial hemorrhage or evidence of cortically based acute infarction. Stable gray-white matter differentiation  throughout the brain. Patchy and moderate mostly periventricular white matter hypodensity. Vascular: Chronic tortuosity. No suspicious intracranial vascular hyperdensity. Skull: Stable.  No acute osseous abnormality identified. Sinuses/Orbits: Visualized paranasal sinuses and mastoids are stable and well aerated. Other: No acute orbit or scalp soft tissue finding. IMPRESSION: 1.  No acute traumatic injury identified. 2. Stable non contrast CT appearance of the brain. Moderate chronic white matter disease. Electronically Signed   By: Odessa Fleming M.D.   On: 07/27/2023 13:53     Assessment and Plan:   Elevated troponin Syncope?  Patient without significant cardiac history admitted after neighbor found him on the floor, acutely confused and hallucinating. Was found to have troponin [phone]. ECG with diffuse ST elevation concerning for myopericarditis. Cardiology recommended colchicine, ibuprofen, heparin with full consult today. Notes indicate that patient continued to have agitation/hallucinations in the ED. Heparin was discontinued after patient took out his IV.  Patient without chest pain today. Given this as well as recent agitation, reasonable to continue holding heparin. Will reassess EKG today and follow echocardiogram. Patient a poor candidate for catheterization if significant cardiomyopathy on echo.  Given ESR 20, reasonable to continue Colchicine and Ibuprofen. CRP pending. Do not think that patient would tolerate cMRI. No arrhythmia on telemetry, continue to monitor inpatient. If ongoing concern that patient did have syncope, could consider outpatient Zio at discharge.   Per primary team: Encephalopathy Cognitive/memory impairment    Risk Assessment/Risk Scores:     TIMI Risk Score for Unstable Angina or Non-ST Elevation MI:   The patient's TIMI risk score is 3, which indicates a 13% risk of all cause mortality, new or recurrent myocardial infarction or need for urgent revascularization  in the next 14 days.          For questions or updates, please contact [facility] Please consult [email or web address] for contact info under    Signed, [name], PA-C  07/28/2023 10:11 AM  Patient seen and examined with [name] PA-C.  Agree as above, with the following exceptions and changes as noted below. Pt denies chest pain or shortness of breath.  Echocardiogram obtained in the setting of elevated troponin, unfortunately demonstrates LAD distribution regionals with thrombus at the apex. Gen: NAD, CV: RRR, no murmurs, Lungs: clear, Abd: soft, Extrem: Warm, well perfused, no edema, Neuro/Psych: alert and oriented x 3, normal mood and affect. All available labs, radiology testing, previous records reviewed.  With troponin elevation and EKG changes, cardiac catheterization would be recommended.  However patient has a downtrending troponin, and EKG may now represent infarct.  Echocardiogram does suggest subacute process with LV thrombus.  Unfortunately, patient does not want any invasive measures and would like to go home.  We discussed intervention to better understand nature of regional wall motion abnormalities and reduced ejection fraction, and discussed the need for anticoagulation in the setting of LV thrombus.  Patient feels he has lived a good life and does not want any further investigation into this matter, pleasantly so.  I  have recommended palliative care consultation for goals of care discussion and if communicated this to the primary hospitalist.  If no intervention pursued, patient may still benefit from anticoagulation if fall risk is not felt to be prohibitive.  At this time patient is deferring all further investigation and medical therapy in favor of conservative management.  Palliative care may assist with symptom management home-going.  Patient has an appointment with his primary care physician Dr. Uvaldo Rising soon after discharge.  Parke Poisson, MD 07/28/23 4:06  PM

## 2023-07-28 NOTE — Progress Notes (Signed)
  Progress Note   Patient: Gabriel Richardson ZOX:[id number] DOB: 04-Mar-1946 DOA: 07/27/2023     1 DOS: the patient was seen and examined on 07/28/2023   Brief hospital course: 78 y.o. male with medical history significant of bipolar, COPD, dementia who presents manage department after syncopal event.  Found to have pericarditis/myocarditis.  Assessment and Plan:  Acute metabolic encephalopathy - Etiology unclear, patient was found down, altered, confused.  Appears to be showing improvement this morning.  Rational, conversant.  Will continue to monitor closely.  Acute myopericarditis - Marked troponin elevation however flat.  ECGs showing diffuse ST elevations with some depressions suggestive of pericarditis.  Cardiology consulted and following closely.  Currently on colchicine with ibuprofen.  Unlikely ACS.  Holding off on catheterization at this time.  Continues on telemetry.  Monitoring inflammatory markers.  Mood disorder - Continue home Lamictal and Namenda.    Essential tremor - Primidone on board.  Insomnia - Home Ambien on board.     Subjective: Patient sitting up in bed, pleasant, conversant.  Denies any chest pain, fever, chills, nausea, vomiting.  Physical Exam: Vitals:   07/28/23 0030 07/28/23 0100 07/28/23 0437 07/28/23 0835  BP: 104/70  101/75 101/67  Pulse: 89  93 92  Resp:  15 16 17   Temp: 98.1 F (36.7 C)  98.3 F (36.8 C) 98 F (36.7 C)  TempSrc: Axillary  Axillary Axillary  SpO2: 100%  98% 96%  Weight:      Height:       GENERAL:  Alert, pleasant, no acute distress, frail HEENT:  EOMI CARDIOVASCULAR:  RRR, no murmurs appreciated RESPIRATORY:  Clear to auscultation, no wheezing, rales, or rhonchi GASTROINTESTINAL:  Soft, nontender, nondistended EXTREMITIES:  No LE edema bilaterally NEURO:  No new focal deficits appreciated SKIN:  No rashes noted PSYCH:  Appropriate mood and affect   Data Reviewed:  ECG personally reviewed noting diffuse ST elevation  with some mild PR depressions suggestive of pericarditis  Family Communication: None at bedside  Disposition: Status is: Inpatient Remains inpatient appropriate because: Pericarditis with risk of arrhythmia  Planned Discharge Destination: Home    Time spent: 35 minutes  Author: Deanna Artis, DO 07/28/2023 2:28 PM  For on call review [email or web address].

## 2023-07-28 NOTE — Progress Notes (Signed)
  Echocardiogram 2D Echocardiogram has been performed.  Leda Roys RDCS 07/28/2023, 1:58 PM

## 2023-07-29 DIAGNOSIS — I513 Intracardiac thrombosis, not elsewhere classified: Secondary | ICD-10-CM | POA: Diagnosis not present

## 2023-07-29 DIAGNOSIS — R55 Syncope and collapse: Secondary | ICD-10-CM | POA: Diagnosis not present

## 2023-07-29 LAB — CBC
HCT: 40.7 % (ref 39.0–52.0)
Hemoglobin: 13.7 g/dL (ref 13.0–17.0)
MCH: 33.9 pg (ref 26.0–34.0)
MCHC: 33.7 g/dL (ref 30.0–36.0)
MCV: 100.7 fL — ABNORMAL HIGH (ref 80.0–100.0)
Platelets: 157 10*3/uL (ref 150–400)
RBC: 4.04 MIL/uL — ABNORMAL LOW (ref 4.22–5.81)
RDW: 14.1 % (ref 11.5–15.5)
WBC: 6 10*3/uL (ref 4.0–10.5)
nRBC: 0 % (ref 0.0–0.2)

## 2023-07-29 LAB — BASIC METABOLIC PANEL
Anion gap: 10 (ref 5–15)
BUN: 14 mg/dL (ref 8–23)
CO2: 22 mmol/L (ref 22–32)
Calcium: 8.6 mg/dL — ABNORMAL LOW (ref 8.9–10.3)
Chloride: 102 mmol/L (ref 98–111)
Creatinine, Ser: 0.77 mg/dL (ref 0.61–1.24)
GFR, Estimated: >60 mL/min (ref >60)
Glucose, Bld: 94 mg/dL (ref 70–99)
Potassium: 3.8 mmol/L (ref 3.5–5.1)
Sodium: 134 mmol/L — ABNORMAL LOW (ref 135–145)

## 2023-07-29 LAB — TROPONIN I (HIGH SENSITIVITY): Troponin I (High Sensitivity): 1119 ng/L (ref <18)

## 2023-07-29 LAB — MAGNESIUM: Magnesium: 1.8 mg/dL (ref 1.7–2.4)

## 2023-07-29 LAB — HIGH SENSITIVITY CRP: CRP, High Sensitivity: 52.27 mg/L — ABNORMAL HIGH (ref 0.00–3.00)

## 2023-07-29 MED ORDER — CLOPIDOGREL BISULFATE 75 MG PO TABS: 75.0000 mg | ORAL_TABLET | Freq: Every day | ORAL | 2 refills | Status: AC

## 2023-07-29 MED ORDER — CLOPIDOGREL BISULFATE 75 MG PO TABS
75.0000 mg | ORAL_TABLET | Freq: Every day | ORAL | Status: DC
  Administered 2023-07-29: 75 mg via ORAL
  Filled 2023-07-29: qty 1

## 2023-07-29 MED ORDER — ENOXAPARIN SODIUM 80 MG/0.8ML IJ SOSY: 1.0000 mg/kg | PREFILLED_SYRINGE | Freq: Two times a day (BID) | INTRAMUSCULAR | 11 refills | Status: DC

## 2023-07-29 NOTE — Progress Notes (Signed)
 Progress Note   Patient: Gabriel Richardson WUJ:[id number] DOB: 05/12/1946 DOA: 07/27/2023     2 DOS: the patient was seen and examined on 07/29/2023   Brief hospital course: 78 y.o. male with medical history significant of bipolar, COPD, dementia who presents manage department after syncopal event.  Found to have pericarditis/myocarditis.  Assessment and Plan:  Acute metabolic encephalopathy - Etiology unclear, patient was found down, altered, confused.  Appears to be showing improvement this morning.  Rational, conversant.  Encephalopathy appears to be resolved.  Possibility of some underlying dementia.  Patient does not quite have full grasp of his worsening cardiac function and overall health.  Continues to rely on his primary care provider for ultimate guidance.  Does not appear to have evidence of incapacitation of decision making however.  Acute myopericarditis - Marked troponin elevation however now downtrending.  On presentation, ECGs showing diffuse ST elevations with some depressions suggestive of pericarditis.  Cardiology consulted and following closely.  Currently on colchicine with ibuprofen.  Initially felt unlikely to be ACS.  Holding off on catheterization at this time given patient's comorbidities as well as patient's desire to pursue cath.  HFrEF - Echo noting EF 40-45% with regional wall motion abnormalities.  Distal LV and apex are akinetic with prominent laminated LV clot.  Cardiology consulted and following closely.  At the very least we will need to initiate anticoagulation.  Discussion with cardiology and patient about pursuing cardiac catheterization.  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  Likely result of apical akinesis, possibly from ischemic heart disease.  Cardiology following closely.  At the very least we will need to initiate anticoagulation.   Mood disorder - Continue home Lamictal and Namenda.    Essential tremor - Primidone on board.  Insomnia - Home Ambien on  board.  Goals of care - Patient continues to be somewhat passive about his diagnosis and new echo findings of reduced EF, LV thrombus, possible ischemic heart disease.  Working closely with cardiology, patient on disposition.  Discussion about whether to pursue invasive catheterization as well as initiating anticoagulation.  Patient himself is motivated to be discharged.  If no further inpatient cardiac workup, and patient amenable to initiating anticoagulation, reasonable to think that patient can be discharged home with close follow-up with PCP.     Subjective: Patient sitting up in bed, pleasant, conversant.  Denies any chest pain, fever, chills, nausea, vomiting.  Had a discussion about patient's echo findings again.  Patient himself seems pretty aloof.  Motivated to be discharged home.  Discussed the possibility of starting anticoagulation or doing more invasive cardiac workup, patient believes that he will trust his medical workup to his outpatient PCP.  Attempted to state that in the hospital we will be caring for him and making these decisions, after which he can follow with his PCP on whether to continue therapy or not.  Patient seemed okay with this compromise.  Currently denies any chest pain, shortness of breath, nausea, vomiting, abdominal pain.  Physical Exam: Vitals:   07/28/23 1658 07/28/23 2300 07/29/23 0324 07/29/23 0330  BP: 128/84 123/83 108/74   Pulse: 99 (!) 103 (!) 108   Resp: 19 16 20 18   Temp: 97.8 F (36.6 C) 97.6 F (36.4 C) 97.7 F (36.5 C)   TempSrc: Oral Oral Oral   SpO2: 99% 95% 98%   Weight:      Height:       GENERAL:  Alert, pleasant, no acute distress, frail HEENT:  EOMI CARDIOVASCULAR:  RRR, no murmurs appreciated RESPIRATORY:  Clear to auscultation, no wheezing, rales, or rhonchi GASTROINTESTINAL:  Soft, nontender, nondistended EXTREMITIES:  No LE edema bilaterally NEURO:  No new focal deficits appreciated SKIN:  No rashes noted PSYCH:  Appropriate  mood and affect   Data Reviewed:  ECG personally reviewed noting diffuse ST elevation with some mild PR depressions suggestive of pericarditis  Family Communication: None at bedside  Disposition: Status is: Inpatient Remains inpatient appropriate because: Pericarditis with risk of arrhythmia  Planned Discharge Destination: Home    Time spent: 38 minutes  Author: Deanna Artis, DO 07/29/2023 11:41 AM  For on call review [email or web address].

## 2023-07-29 NOTE — Discharge Summary (Signed)
 Physician Discharge Summary   Patient: Gabriel Richardson MRN: [id number] DOB: 06-30-1945  Admit date:     07/27/2023  Discharge date: 07/29/23  Discharge Physician: Deanna Artis   PCP: Gweneth Dimitri, MD   Recommendations at discharge:   At this time patient is leaving AGAINST MEDICAL ADVICE.  Please see discussion/recommendations below. Discharge Diagnoses: Principal Problem:   Syncope  Resolved Problems:   * No resolved hospital problems. *  Hospital Course: 78 y.o. male with medical history significant of bipolar, COPD, dementia who presents manage department after syncopal event.  Found to have pericarditis/myocarditis.   Assessment and Plan:  Acute metabolic encephalopathy - Etiology unclear, patient was found down, altered, confused.  Appears to be showing improvement this morning.  Rational, conversant.  Encephalopathy appears to be resolved.  Continues to rely on his primary care provider for ultimate guidance.  Does not appear to have evidence of incapacitation of decision making however.   Suspected Acute myopericarditis - Marked troponin elevation however now downtrending.  On presentation, ECGs showing diffuse ST elevations with some depressions suggestive of pericarditis.  Cardiology consulted and following closely.  Initially on colchicine with ibuprofen.  Initially felt unlikely to be ACS, however ECHO noting wall motion abnormalities and thrombus.  Suspected probable completed infarct and LV thrombus with LAD disease.  Continued colchicine and ibuprofen.  Holding off on catheterization at this time given patient's comorbidities as well as patient's desire to pursue cath.  Initiated on Plavix.  Aspirin, statin already on board.   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  Distal LV and apex are akinetic with prominent laminated LV clot.  Cardiology consulted and following closely.  At the very least we will need to  initiate anticoagulation.  Complications given patient's primidone interacts with DOACs.  Patient appears unwilling to take Lovenox.  Amenable to warfarin but not amenable to stay for bridging or dose management.  See recommendations below.   Mood disorder - Continue home Lamictal and Namenda.     Essential tremor - Primidone on board.  Patient has been taking it for 20+ years.  Unlikely to be weaned off.   Insomnia - Home Ambien on board.   Goals of care-patient leaving AGAINST MEDICAL ADVICE - Patient continues to be somewhat passive about his diagnosis and new echo findings of reduced EF, LV thrombus, possible ischemic heart disease.  Working closely with cardiology, patient on disposition.  Agree that there is no plan for invasive cardiac catheterization while inpatient.  However it is imperative that patient start anticoagulation.  Unfortunately given the patient's longstanding primidone use, DOACs are unavailable to use given interaction that limits their efficacy.  Warfarin with Lovenox bridge would be the next choice however patient is adamant that he does not want to wait to become therapeutic on warfarin.  He is also adamant that he will not give himself Lovenox injections twice per day.  Pt inquiring why he cannot just take warfarin and follow-up with his PCP.  Instructed him that warfarin has to be dosed specifically for him with his INR checked to guide dose adjustment and therapeutic level.  This usually takes days to which the patient stated he would not be waiting for that.  Spent a great deal of time discussing the risks of being off anticoagulation with the LV thrombus including the risk of stroke, sudden death, ischemic disease.  Patient understood but was still adamant about leaving without anticoagulation in place.  Despite his wishes, we will still send prescription for Lovenox and encouraged him to use the injections twice a day until he is able to follow-up with his PCP and be  initiated on warfarin dosing regimen.  Also giving prescriptions for Plavix to add to his cardiac regimen.      Consultants: Cardiology Procedures performed: None Disposition:  Patient left AGAINST MEDICAL ADVICE Diet recommendation:  Discharge Diet Orders (From admission, onward)     Start     Ordered   07/29/23 0000  Diet - low sodium heart healthy        07/29/23 1437           Cardiac diet DISCHARGE MEDICATION: Allergies as of 07/29/2023   No Known Allergies      Medication List     TAKE these medications    acetaminophen 500 MG tablet Commonly known as: TYLENOL Take 1,000 mg by mouth every 6 (six) hours as needed for mild pain or headache.   clopidogrel 75 MG tablet Commonly known as: PLAVIX Take 1 tablet (75 mg total) by mouth daily. Start taking on: July 30, 2023   cyclobenzaprine 10 MG tablet Commonly known as: FLEXERIL Take 10 mg by mouth 3 (three) times daily as needed for muscle spasms.   enoxaparin 80 MG/0.8ML injection Commonly known as: LOVENOX Inject 0.8 mLs (80 mg total) into the skin 2 (two) times daily.   furosemide 40 MG tablet Commonly known as: LASIX Take 20-40 mg by mouth See admin instructions. Take 40 mg by mouth once a day and an additional 20 mg as needed for leg swelling   lamoTRIgine 200 MG tablet Commonly known as: LAMICTAL Take 200 mg by mouth at bedtime.   memantine 10 MG tablet Commonly known as: NAMENDA Take 1 tablet twice daily. What changed:  how much to take how to take this when to take this additional instructions   oxyCODONE-acetaminophen 10-325 MG tablet Commonly known as: PERCOCET Take 1 tablet by mouth every 12 (twelve) hours as needed for pain.   pregabalin 100 MG capsule Commonly known as: LYRICA TAKE 1 CAPSULE BY MOUTH 2 TIMES A DAY   primidone 50 MG tablet Commonly known as: MYSOLINE TAKE 2 TABLETS BY MOUTH TWICE A DAY   propranolol ER 120 MG 24 hr capsule Commonly known as: INDERAL LA TAKE 1  CAPSULE BY MOUTH TWICE A DAY OK TO TAKE EXTRA CAPSULE AS NEEDED FOR WORSENING TREMOR What changed: See the new instructions.   sildenafil 100 MG tablet Commonly known as: Viagra Take 1 tablet (100 mg total) by mouth daily as needed for erectile dysfunction.   zolpidem 12.5 MG CR tablet Commonly known as: AMBIEN CR Take 12.5 mg by mouth at bedtime as needed (for insomnia).        Discharge Exam: Filed Weights   07/27/23 1226 07/27/23 2033  Weight: 89.8 kg 80 kg   GENERAL:  Alert, pleasant, no acute distress, frail HEENT:  EOMI CARDIOVASCULAR:  RRR, no murmurs appreciated RESPIRATORY:  Clear to auscultation, no wheezing, rales, or rhonchi GASTROINTESTINAL:  Soft, nontender, nondistended EXTREMITIES:  No LE edema bilaterally NEURO:  No new focal deficits appreciated SKIN:  No rashes noted PSYCH:  Appropriate mood and affect   Condition at discharge: improving  The results of significant diagnostics from this hospitalization (including imaging, microbiology, ancillary and laboratory) are listed below for reference.   Imaging Studies: ECHOCARDIOGRAM COMPLETE Result Date: 07/28/2023    ECHOCARDIOGRAM REPORT   Patient Name:   [facility]  G Malicki Date of Exam: 07/28/2023 Medical Rec #:  [id number]     Height:       70.0 in Accession #:    [id number]    Weight:       176.4 lb Date of Birth:  Jul 07, 1945     BSA:          1.979 m Patient Age:    77 years      BP:           101/75 mmHg Patient Gender: M             HR:           99 bpm. Exam Location:  Inpatient Procedure: 2D Echo, Color Doppler and Cardiac Doppler (Both Spectral and Color            Flow Doppler were utilized during procedure). Indications:    Syncope R55  History:        Patient has no prior history of Echocardiogram examinations.                 COPD.  Sonographer:    Harriette Bouillon RDCS Referring Phys: [id number] ROBERT DORRELL IMPRESSIONS  1. Left ventricular ejection fraction, by estimation, is 40 to 45%. The left ventricle has  mildly decreased function. The left ventricle demonstrates regional wall motion abnormalities (see scoring diagram/findings for description). Left ventricular diastolic parameters are indeterminate.  2. Right ventricular systolic function is normal. The right ventricular size is normal.  3. The mitral valve is normal in structure. Trivial mitral valve regurgitation. No evidence of mitral stenosis.  4. The aortic valve was not well visualized. Aortic valve regurgitation is not visualized. No aortic stenosis is present.  5. Aortic dilatation noted. There is borderline dilatation of the aortic root, measuring 39 mm.  6. The inferior vena cava is normal in size with greater than 50% respiratory variability, suggesting right atrial pressure of 3 mmHg. Conclusion(s)/Recommendation(s): The base to mid ventricle are hyperdynamic. The distal LV and apex are akinetic with prominent laminated LV clot. Findings concerning for underlying mid to distal LAD occlusion. FINDINGS  Left Ventricle: Left ventricular ejection fraction, by estimation, is 40 to 45%. The left ventricle has mildly decreased function. The left ventricle demonstrates regional wall motion abnormalities. The left ventricular internal cavity size was normal in size. There is no left ventricular hypertrophy. Left ventricular diastolic parameters are indeterminate.  LV Wall Scoring: The mid and distal lateral wall, mid and distal anterior septum, entire apex, and mid and distal inferior wall are akinetic. Right Ventricle: The right ventricular size is normal. No increase in right ventricular wall thickness. Right ventricular systolic function is normal. Left Atrium: Left atrial size was normal in size. Right Atrium: Right atrial size was normal in size. Pericardium: There is no evidence of pericardial effusion. Mitral Valve: The mitral valve is normal in structure. Trivial mitral valve regurgitation. No evidence of mitral valve stenosis. Tricuspid Valve: The  tricuspid valve is grossly normal. Tricuspid valve regurgitation is trivial. No evidence of tricuspid stenosis. Aortic Valve: The aortic valve was not well visualized. Aortic valve regurgitation is not visualized. No aortic stenosis is present. Pulmonic Valve: The pulmonic valve was not well visualized. Pulmonic valve regurgitation is not visualized. No evidence of pulmonic stenosis. Aorta: Aortic dilatation noted and the aortic root was not well visualized. There is borderline dilatation of the aortic root, measuring 39 mm. Venous: The inferior vena cava is normal in size with greater than 50% respiratory  variability, suggesting right atrial pressure of 3 mmHg. IAS/Shunts: No atrial level shunt detected by color flow Doppler.  LEFT VENTRICLE PLAX 2D LVIDd:         4.40 cm LVIDs:         2.80 cm LV PW:         1.00 cm LV IVS:        1.10 cm LVOT diam:     2.20 cm LV SV:         103 LV SV Index:   52 LVOT Area:     3.80 cm  RIGHT VENTRICLE             IVC RV S prime:     20.20 cm/s  IVC diam: 2.00 cm LEFT ATRIUM         Index LA diam:    2.90 cm 1.47 cm/m  AORTIC VALVE LVOT Vmax:   170.00 cm/s LVOT Vmean:  121.000 cm/s LVOT VTI:    0.271 m  AORTA Ao Root diam: 3.90 cm Ao Asc diam:  3.90 cm  SHUNTS Systemic VTI:  0.27 m Systemic Diam: 2.20 cm Arvilla Meres MD Electronically signed by Arvilla Meres MD Signature Date/Time: 07/28/2023/2:07:01 PM    Final    DG Ribs Unilateral W/Chest Left Result Date: 07/27/2023 CLINICAL DATA:  Left-sided chest pain.  Unwitnessed fall. EXAM: LEFT RIBS AND CHEST - 3+ VIEW COMPARISON:  March 18, 2023. FINDINGS: Old left rib fractures are noted. No acute fracture is noted. There is no evidence of pneumothorax or pleural effusion. Both lungs are clear. Heart size and mediastinal contours are within normal limits. IMPRESSION: No acute left rib fractures are noted. Electronically Signed   By: Lupita Raider M.D.   On: 07/27/2023 14:03   CT Cervical Spine Wo Contrast Result Date:  07/27/2023 CLINICAL DATA:  78 year old male found down, may have fallen 2 days earlier. Previous C1 and C2 vertebral fractures. EXAM: CT CERVICAL SPINE WITHOUT CONTRAST TECHNIQUE: Multidetector CT imaging of the cervical spine was performed without intravenous contrast. Multiplanar CT image reconstructions were also generated. RADIATION DOSE REDUCTION: This exam was performed according to the departmental dose-optimization program which includes automated exposure control, adjustment of the mA and/or kV according to patient size and/or use of iterative reconstruction technique. COMPARISON:  Cervical spine CT 03/18/2023 and earlier. FINDINGS: Alignment: Stable since October. Stable cervicothoracic junction and posterior element alignment. Skull base and vertebrae: Visualized skull base is intact. No atlanto-occipital dissociation. Un healed bilateral C1 ring fractures remain nondisplaced on series 11, image 21. Chronically unhealed type 2 odontoid fracture with stable mild retrolisthesis (sagittal image 46). Distraction of those fracture fragments now is 4 mm, increased by 1 mm since October. Superimposed chronic cervical spine surgery. 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 No acute osseous abnormality identified. Soft tissues and spinal canal: No prevertebral fluid or swelling. No visible canal hematoma. Calcified cervical carotid atherosclerosis, bulky on the right. Disc levels: ACDF hardware remains in place at C3-C4. Ongoing suspicion of pseudoarthrosis there although no definite hardware loosening. Chronic spondylolisthesis at C7-T1 with bulky facet and endplate spurring. Conspicuous posterior longitudinal ligament and ligament flavum hypertrophy at the C1-C2 level resulting in mild spinal stenosis there, but not significantly changed from September last year. Upper chest: Upper thoracic ankylosis but not at T1-T2 with chronic vacuum disc there. Negative visible lung apices. IMPRESSION: 1.  Chronically unhealed C1 and C2 fractures. Minimal (1 mm) additional distraction of the odontoid fragment since October.  2. No new fracture or acute osseous abnormality in the cervical spine. 3. 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 4. Partially visible upper thoracic spinal ankylosis. Electronically Signed   By: Odessa Fleming M.D.   On: 07/27/2023 14:02   DG Pelvis Portable Result Date: 07/27/2023 CLINICAL DATA:  Unwitnessed fall. EXAM: PORTABLE PELVIS 1-2 VIEWS COMPARISON:  July 03, 2020. FINDINGS: There is no evidence of pelvic fracture or diastasis. No pelvic bone lesions are seen. IMPRESSION: Negative. Electronically Signed   By: Lupita Raider M.D.   On: 07/27/2023 14:01   CT Head Wo Contrast Result Date: 07/27/2023 CLINICAL DATA:  78 year old male found down, may have fallen 2 days earlier. EXAM: CT HEAD WITHOUT CONTRAST TECHNIQUE: Contiguous axial images were obtained from the base of the skull through the vertex without intravenous contrast. RADIATION DOSE REDUCTION: This exam was performed according to the departmental dose-optimization program which includes automated exposure control, adjustment of the mA and/or kV according to patient size and/or use of iterative reconstruction technique. COMPARISON:  CT head and cervical spine 03/18/2023. FINDINGS: Brain: Stable cerebral volume. No midline shift, ventriculomegaly, mass effect, evidence of mass lesion, intracranial hemorrhage or evidence of cortically based acute infarction. Stable gray-white matter differentiation throughout the brain. Patchy and moderate mostly periventricular white matter hypodensity. Vascular: Chronic tortuosity. No suspicious intracranial vascular hyperdensity. Skull: Stable.  No acute osseous abnormality identified. Sinuses/Orbits: Visualized paranasal sinuses and mastoids are stable and well aerated. Other: No acute  orbit or scalp soft tissue finding. IMPRESSION: 1.  No acute traumatic injury identified. 2. Stable non contrast CT appearance of the brain. Moderate chronic white matter disease. Electronically Signed   By: Odessa Fleming M.D.   On: 07/27/2023 13:53    Microbiology: Results for orders placed or performed during the hospital encounter of 08/08/20  Surgical pcr screen     Status: None   Collection Time: 08/08/20  3:32 PM   Specimen: Nasal Mucosa; Nasal Swab  Result Value Ref Range Status   MRSA, PCR NEGATIVE NEGATIVE Final   Staphylococcus aureus NEGATIVE NEGATIVE Final    Comment: (NOTE) The Xpert SA Assay (FDA approved for NASAL specimens in patients 56 years of age and older), is one component of a comprehensive surveillance program. It is not intended to diagnose infection nor to guide or monitor treatment. Performed at [facility], [address] [place], [place] [address]     Labs: CBC: Recent Labs  Lab 07/27/23 1358 07/27/23 1405 07/28/23 0329 07/29/23 0415  WBC 9.2  --  7.3 6.0  HGB 13.8 14.3 13.1 13.7  HCT 41.3 42.0 38.6* 40.7  MCV 100.5*  --  100.3* 100.7*  PLT 190  --  184 157   Basic Metabolic Panel: Recent Labs  Lab 07/27/23 1358 07/27/23 1405 07/29/23 0415  NA 135 137 134*  K 3.8 3.6 3.8  CL 99 99 102  CO2 21*  --  22  GLUCOSE 99 98 94  BUN 9 7* 14  CREATININE 0.52* 0.60* 0.77  CALCIUM 8.5*  --  8.6*  MG  --   --  1.8   Liver Function Tests: Recent Labs  Lab 07/27/23 1358  AST 59*  ALT 20  ALKPHOS 88  BILITOT 2.2*  PROT 7.2  ALBUMIN 3.3*   CBG: No results for input(s): "GLUCAP" in the last 168 hours.  Discharge time spent: greater than 30 minutes.  Signed: Francee Piccolo  Shelbie Ammons, DO [facility] 07/29/2023

## 2023-07-29 NOTE — Progress Notes (Addendum)
 Patient Name: Gabriel Richardson Date of Encounter: 07/29/2023 [facility] Cardiologist: None   Interval Summary  .    Patient reports no focal complaints today. He converses well, does not appear agitated but does continue with lack of insight regarding this admission and acute cardiac abnormalities.   Vital Signs .    Vitals:   07/28/23 1658 07/28/23 2300 07/29/23 0324 07/29/23 0330  BP: 128/84 123/83 108/74   Pulse: 99 (!) 103 (!) 108   Resp: 19 16 20 18   Temp: 97.8 F (36.6 C) 97.6 F (36.4 C) 97.7 F (36.5 C)   TempSrc: Oral Oral Oral   SpO2: 99% 95% 98%   Weight:      Height:        Intake/Output Summary (Last 24 hours) at 07/29/2023 0917 Last data filed at 07/28/2023 1933 Gross per 24 hour  Intake 120 ml  Output 700 ml  Net -580 ml      07/27/2023    8:33 PM 07/27/2023   12:26 PM 03/18/2023    3:48 PM  Last 3 Weights  Weight (lbs) 176 lb 5.9 oz 197 lb 15.6 oz 198 lb  Weight (kg) 80 kg 89.8 kg 89.812 kg      Telemetry/ECG    Increasing HR on telemetry, tachycardic into the low 100s this morning - Personally Reviewed  Physical Exam .   GEN: No acute distress.   Neck: No JVD Cardiac: rapid rate this morning, no murmurs, rubs, or gallops.  Respiratory: Clear to auscultation bilaterally. GI: Soft, nontender, non-distended  MS: No edema  Assessment & Plan .     Elevated troponin HFrEF LV thrombus Syncope?   Patient without significant cardiac history admitted after neighbor found him on the floor, acutely confused and hallucinating. Was found to have troponin [phone]. ECG with diffuse ST elevation concerning for myopericarditis. Cardiology recommended colchicine, ibuprofen, heparin prior to full consult. Notes indicate that patient continued to have agitation/hallucinations in the ED. Heparin was discontinued after patient took out his IV.  TTE on 3/10 with concerning findings. LVEF 40-45%, base to mid ventricle hyperdynamic and distal LV and apex  akinetic with laminated LV clot. Findings concerning for mid to distal LAD occlusion. Given this along with ECG, more concerning for ischemic etiology. Downtrending troponin suggests that patient has completed potential infarction. Not clear to me that catheterization indicated or if patient would be agreeable. Although acute agitation appears to have resolved, he has notable lack of insight into his admission/new cardiac concerns. Could now safely consider anticoagulation for LV thrombus. Will discuss both this and LHC with Dr. Jacques Navy. Palliative care involvement also recommended.  Patient without chest pain today. Denies any chest pain prior to admission as well.  Sinus tachycardia on telemetry today. Regarding reported possible syncope, no arrhythmia seen this admission.  HF GDMT will be limited by BP. Consider low dose beta blocker prior to discharge.    Per primary team: Encephalopathy Cognitive/memory impairment   For questions or updates, please contact [facility] Please consult [email or web address] for contact info under        Signed, [name], PA-C   Patient seen and examined with [name] PA-C.  Agree as above, with the following exceptions and changes as noted below. Complex situation with probable completed infarct and LV thrombus, suspect LAD disease. Patient is not interested in ischemic workup after discussing with him that he has likely had an MI. He is however amenable to treatment with medical therapy.  We discussed in detail risks and benefits of antiplatelet and anticoagulation. Gen: NAD, CV: RRR, no murmurs, Lungs: clear, Abd: soft, Extrem: Warm, well perfused, no edema, Neuro/Psych: alert and oriented x 3, normal mood and affect. All available labs, radiology testing, previous records reviewed. This is a tough situation given his fall risk, however he may derive the most benefit from antiplatelet therapy for medical management of presumed NSTEMI and  anticoagulation for LV thrombus. Would recommend starting plavix 75 mg daily. ADDENDUM: Not a candidate for oral AC while on primidone. Very challenging decision making. May want to consider therapeutic lovenox dosing. Will discuss with hospitalist and pharmacist. Triple therapy with aspirin may further increase bleeding risk, will defer aspirin for now. Need to consider outpatient monitor.   Diagnosis unlikely to be pericarditis, and I have stopped these therapies.   Parke Poisson, MD 07/29/23 12:44 PM

## 2023-07-29 NOTE — TOC Initial Note (Addendum)
 Transition of Care [facility]) - Initial/Assessment Note    Patient Details  Name: Gabriel Richardson MRN: [id number] Date of Birth: February 07, 1946  Transition of [facility] CM/SW Contact:    Larrie Kass, LCSW Phone Number: 07/29/2023, 12:32 PM  Clinical Narrative:                 CSW met with the pt at the bedside. The pt was asleep but easy to arouse and is oriented x4. Pt reports that he lives alone and plans to return home at the end of his hospitalization. Pt stated he has a brother who lives in [place] and a few friends who check in on him. The pt provided CSW with his brother's contact information and gave permission to add Sharyn Lull as an emergency contact. Pt stated he uses Lyft, [employer], and [employer] to purchase food and arrange transportation if his friends are unable to assist. Pt stated he uses a cane at baseline but also has a walker at home. Pt is open to PCS services to help in his home. He is specifically requesting assistance between 6 p.m. and 9 p.m. each night. CSW informed the pt about Chemical [employer]. Pt agreed for the referral to be made. TOC to follow.     ADDEN Pt being d/c today 24 hour old expired on 3/10. CSW provided brochure for [facility] as well as referral sent out. Pt called his own ride home who will be here around 3:30pm. No further TOC needs TOC sign off.    Expected Discharge Plan: Home/Self Care Barriers to Discharge: Continued Medical Work up   Patient Goals and CMS Choice Patient states their goals for this hospitalization and ongoing recovery are:: retrun home with PCS          Expected Discharge Plan and Services       Living arrangements for the past 2 months: Apartment                                      Prior Living Arrangements/Services Living arrangements for the past 2 months: Apartment Lives with:: Self Patient language and need for interpreter reviewed:: Yes Do you feel safe going back to the place where you  live?: Yes      Need for Family Participation in Patient Care: No (Comment) Care giver support system in place?: No (comment) Current home services: DME Criminal Activity/Legal Involvement Pertinent to Current Situation/Hospitalization: No - Comment as needed  Activities of Daily Living   ADL Screening (condition at time of admission) Independently performs ADLs?: No Does the patient have a NEW difficulty with bathing/dressing/toileting/self-feeding that is expected to last >3 days?: No Does the patient have a NEW difficulty with getting in/out of bed, walking, or climbing stairs that is expected to last >3 days?: No Does the patient have a NEW difficulty with communication that is expected to last >3 days?: No Is the patient deaf or have difficulty hearing?: No Does the patient have difficulty seeing, even when wearing glasses/contacts?: Yes Does the patient have difficulty concentrating, remembering, or making decisions?: Yes  Permission Sought/Granted                  Emotional Assessment Appearance:: Appears stated age Attitude/Demeanor/Rapport: Gracious, Engaged, Charismatic Affect (typically observed): Accepting Orientation: : Oriented to Place, Oriented to  Time, Oriented to Situation, Oriented to Self   Psych Involvement: No (comment)  Admission diagnosis:  Syncope [R55] Patient Active Problem List   Diagnosis Date Noted   Syncope 07/27/2023   Thoracic myelopathy 08/08/2020   Closed fracture of right distal fibula 01/07/2019   Right trigeminal neuralgia 07/31/2015   Benign essential tremor 07/31/2015   Ptosis 12/14/2012   Tremor 12/14/2012   Depression 11/02/2012   Asthma, chronic 11/02/2012   HYPERTENSION, BENIGN 01/15/2009   ABNORMAL ELECTROCARDIOGRAM 01/12/2009   PCP:  Gweneth Dimitri, MD Pharmacy:   [facility] [id number] - [place], [place] - [address] [place] [address] Phone: [phone] Fax:  [phone]     Social Drivers of Health (SDOH) Social History: SDOH Screenings   Food Insecurity: No Food Insecurity (09/10/2022)   Received from [facility]  Transportation Needs: Patient Unable To Answer (07/27/2023)  Utilities: Not At Risk (09/10/2022)   Received from [facility]  Financial Resource Strain: Low Risk  (09/10/2022)   Received from [facility], [facility]  Social Connections: Unknown (09/17/2021)   Received from [facility], [facility]  Tobacco Use: Medium Risk (07/28/2023)   SDOH Interventions:     Readmission Risk Interventions     No data to display

## 2023-07-29 NOTE — Discharge Instructions (Signed)
 Information on my medicine - ELIQUIS (apixaban)  This medication education was reviewed with me or my healthcare representative as part of my discharge preparation.  The pharmacist that spoke with me during my hospital stay was:  Kalid Ghan A, RPH  Why was Eliquis prescribed for you? Eliquis was prescribed to treat a blood clot found in the chambers of your heart, and to reduce the risk of it occurring again.  What do You need to know about Eliquis ? The starting dose is 10 mg (two 5 mg tablets) taken TWICE daily for the FIRST SEVEN (7) DAYS, then on (enter date)  ***  the dose is reduced to ONE 5 mg tablet taken TWICE daily.  Eliquis may be taken with or without food.   Try to take the dose about the same time in the morning and in the evening. If you have difficulty swallowing the tablet whole please discuss with your pharmacist how to take the medication safely.  Take Eliquis exactly as prescribed and DO NOT stop taking Eliquis without talking to the doctor who prescribed the medication.  Stopping may increase your risk of developing a new blood clot.  Refill your prescription before you run out.  After discharge, you should have regular check-up appointments with your healthcare provider that is prescribing your Eliquis.    What do you do if you miss a dose? If a dose of ELIQUIS is not taken at the scheduled time, take it as soon as possible on the same day and twice-daily administration should be resumed. The dose should not be doubled to make up for a missed dose.  Important Safety Information A possible side effect of Eliquis is bleeding. You should call your healthcare provider right away if you experience any of the following: Bleeding from an injury or your nose that does not stop. Unusual colored urine (red or dark brown) or unusual colored stools (red or black). Unusual bruising for unknown reasons. A serious fall or if you hit your head (even if there is no  bleeding).  Some medicines may interact with Eliquis and might increase your risk of bleeding or clotting while on Eliquis. To help avoid this, consult your healthcare provider or pharmacist prior to using any new prescription or non-prescription medications, including herbals, vitamins, non-steroidal anti-inflammatory drugs (NSAIDs) and supplements.  This website has more information on Eliquis (apixaban): http://www.eliquis.com/eliquis/home

## 2023-08-01 ENCOUNTER — Other Ambulatory Visit: Payer: Self-pay | Admitting: Neurology

## 2023-08-07 DIAGNOSIS — I219 Acute myocardial infarction, unspecified: Secondary | ICD-10-CM | POA: Diagnosis not present

## 2023-08-07 DIAGNOSIS — F1020 Alcohol dependence, uncomplicated: Secondary | ICD-10-CM | POA: Diagnosis not present

## 2023-08-07 DIAGNOSIS — I309 Acute pericarditis, unspecified: Secondary | ICD-10-CM | POA: Diagnosis not present

## 2023-08-07 DIAGNOSIS — I236 Thrombosis of atrium, auricular appendage, and ventricle as current complications following acute myocardial infarction: Secondary | ICD-10-CM | POA: Diagnosis not present

## 2023-08-07 DIAGNOSIS — R296 Repeated falls: Secondary | ICD-10-CM | POA: Diagnosis not present

## 2023-08-07 DIAGNOSIS — Z6824 Body mass index (BMI) 24.0-24.9, adult: Secondary | ICD-10-CM | POA: Diagnosis not present

## 2023-08-07 DIAGNOSIS — R5381 Other malaise: Secondary | ICD-10-CM | POA: Diagnosis not present

## 2023-08-14 DIAGNOSIS — F1020 Alcohol dependence, uncomplicated: Secondary | ICD-10-CM | POA: Diagnosis not present

## 2023-08-14 DIAGNOSIS — M7150 Other bursitis, not elsewhere classified, unspecified site: Secondary | ICD-10-CM | POA: Diagnosis not present

## 2023-08-14 DIAGNOSIS — M542 Cervicalgia: Secondary | ICD-10-CM | POA: Diagnosis not present

## 2023-08-14 DIAGNOSIS — L98499 Non-pressure chronic ulcer of skin of other sites with unspecified severity: Secondary | ICD-10-CM | POA: Diagnosis not present

## 2023-08-14 DIAGNOSIS — F02A18 Dementia in other diseases classified elsewhere, mild, with other behavioral disturbance: Secondary | ICD-10-CM | POA: Diagnosis not present

## 2023-08-14 DIAGNOSIS — G250 Essential tremor: Secondary | ICD-10-CM | POA: Diagnosis not present

## 2023-08-14 DIAGNOSIS — G500 Trigeminal neuralgia: Secondary | ICD-10-CM | POA: Diagnosis not present

## 2023-08-14 DIAGNOSIS — F3170 Bipolar disorder, currently in remission, most recent episode unspecified: Secondary | ICD-10-CM | POA: Diagnosis not present

## 2023-08-14 DIAGNOSIS — I236 Thrombosis of atrium, auricular appendage, and ventricle as current complications following acute myocardial infarction: Secondary | ICD-10-CM | POA: Diagnosis not present

## 2023-08-19 ENCOUNTER — Encounter: Payer: Self-pay | Admitting: Neurology

## 2023-08-19 ENCOUNTER — Ambulatory Visit: Payer: Medicare HMO | Admitting: Neurology

## 2023-08-19 VITALS — BP 113/69 | HR 84 | Ht 70.0 in | Wt 158.0 lb

## 2023-08-19 DIAGNOSIS — F419 Anxiety disorder, unspecified: Secondary | ICD-10-CM | POA: Diagnosis not present

## 2023-08-19 DIAGNOSIS — G500 Trigeminal neuralgia: Secondary | ICD-10-CM

## 2023-08-19 DIAGNOSIS — G250 Essential tremor: Secondary | ICD-10-CM

## 2023-08-19 DIAGNOSIS — F03918 Unspecified dementia, unspecified severity, with other behavioral disturbance: Secondary | ICD-10-CM | POA: Diagnosis not present

## 2023-08-19 DIAGNOSIS — I10 Essential (primary) hypertension: Secondary | ICD-10-CM | POA: Diagnosis not present

## 2023-08-19 DIAGNOSIS — J4489 Other specified chronic obstructive pulmonary disease: Secondary | ICD-10-CM | POA: Diagnosis not present

## 2023-08-19 DIAGNOSIS — L89892 Pressure ulcer of other site, stage 2: Secondary | ICD-10-CM | POA: Diagnosis not present

## 2023-08-19 DIAGNOSIS — F3170 Bipolar disorder, currently in remission, most recent episode unspecified: Secondary | ICD-10-CM | POA: Diagnosis not present

## 2023-08-19 DIAGNOSIS — F02A4 Dementia in other diseases classified elsewhere, mild, with anxiety: Secondary | ICD-10-CM | POA: Diagnosis not present

## 2023-08-19 DIAGNOSIS — G309 Alzheimer's disease, unspecified: Secondary | ICD-10-CM | POA: Diagnosis not present

## 2023-08-19 DIAGNOSIS — F02A3 Dementia in other diseases classified elsewhere, mild, with mood disturbance: Secondary | ICD-10-CM | POA: Diagnosis not present

## 2023-08-19 NOTE — Progress Notes (Signed)
 Follow-up Visit   Date: 08/19/23    Gabriel Richardson MRN: [id number] DOB: [birth year]   Interim History: Gabriel Richardson is a 78 y.o. right-handed Caucasian male with history of depression, asthma, and hypertension here for follow-up with essential tremor, trigeminal neuralgia, and cognitive impairment.  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  He also has long history of alcohol abuse which is certainly contributing to his cognitive impairment  - Continue Namenda 10mg  BID  - Strongly urged him to cut back on alcohol  - Discussed the importance of having someone overlook decision-making due to cognitive impairment.  Benign essential tremor, stable  - Continue primidone 100mg  twice daily  - Continue propranolol 120mg  twice daily.  3.  Right trigeminal neuralgia, stable.  Pain is controlled on Lyrica.   - Previously tried:  Gabapentin, tegretol  - Continue Lyrica 100mg  twice daily  - He takes lamictal 200mg  at bedtime as a mood stabilizer prescribed by Dr. Donell Beers  Return to clinic in 1 year  --------------------------------------------------------- History of present illness: Initial visit 04/30/2013:  He had previously been a patient of Dr. Sandria Manly since 1995, initially for right trigeminal neuralgia which he had frequent flares of since 1997 - early 2000. He has tried trileptal, neurontin, and lyrica and had the most relief with Lyrica.  He has not had a flare since 2000, until 2016, and again in 2024.    Around 2012, he started developing muscle cramps and left hand tremors and was concerned about multiple sclerosis. Although there was white matter changes on his MRI, Dr. Sandria Manly did not find that his exam and imaging was consistent with demyelinating disease. He also had CSF testing in January 2014 which did not show any inflammatory changes. He treated him symptomatically and was started on propranolol 60mg  which has helped with  his tremors.   He is s/p anterior cervical fusion by Dr. Trey Sailors in 2010 and has history of left L5 radiculopathy status post L5-S1 laminectomy.  He saw Dr. Hosie Poisson in July 2016 for new left ptosis.  Myasthenia antibodies were checked and was normal.  He declined to have additional testing done to evaluate this.  Starting in 2018, his hand tremors started getting worse.  His propranolol was steadily increased to 220/d without benefit, so primidone was added and titrated to 150mg /d.  UPDATE 04/08/2022:  He is here for follow-up visit.  Tremors are doing very well on primidone 100mg  BID and propranolol 120mg  BID.  He rarely takes an extra dose of propranolol.  No interval hospitalizations, illnesses, or falls.  He uses a cane for support due to low back pain, for which he gets ESI.  No new neurological complaints.   UPDATE 08/13/2022:  He is here for sooner than scheduled visit due to recurrence of right trigeminal neuralgia.  He started having severe right sided facial pain and was started on Lyrica 100mg  twice daily.  Pain is relieved with Lyrica and he no longer has facial pain.  He has stopped using hydrocodone for low back pain since starting Lyrica.  Tremors remain stable on primidone 100mg  BID and propranolol 120mg  BID.   UPDATE 10/15/2022:  He had two falls about a month ago and since this time he has neck stiffness, unable to rotate his head and has severe pain.  He notices that he is lightheaded when he tries to standup.  He complains of left sided head which is new.   Trigeminal neuralgia is much better  controlled on Lyrica 100mg  twice daily.  He continues to have tremors of the hands which are stable, he has good days and bad days.  He is compliant with primidone 100mg  BID and propranolol 120mg  BID.   UPDATE 12/25/2022:  He is here for sooner follow-up visit due to concern of worsening cognition. He is here alone.  Patient has noticed that his memory is getting worse.  He sometimes forgets  peoples names and details of conversations. He has difficulty recalling day-to-day details, but can remember things from years ago easily. He feels that it is part of normal aging.  His wife helps with medication and he has an Geophysicist/field seismologist that manages finances.  He does not drink.  He denies behavior changes. His primary concern and frustration is lack of pain relief for his neck pain.  He is seeing pain management and takes oxycodone 10mg  twice daily.  He does not feel that it is enough to control pain and tells me that his greatest benefit was with getting ESI.  Pain management physician contacted me stating that his wife indicated concerns of patient wandering and agitated in the evening.  PCP also mentioned concerned of his alcohol use.  He tells me that he drinks 2 manhattan's daily.    UPDATE 04/02/2023:  He is here for follow-up visit with his wife.  This is the first time I am meeting his wife.  They got married 7 years ago, but have been together for 9 years.  She is actively involved in his care.  Over the past several months, there continues to be a gradual declined in cognition where he is forgetting appointments, medications, and sometimes difficult to control at home because of agitation.  Wife reports that he is no longer driving, but on one occasion, he did not listen to her and drive to [employer] where he purchased $100 worth of food.  When he was carrying the food into the home, he had a fall.  He claimed to buy it for his neighbors. Wife manages medications, but has noticed that he may take oxycodone more than once at bedtime.  He reports that he needs it for pain relief.  Finances are managed by [employer].  He continues to drink 2 alcoholic beverages daily and has no interest in stopping or reducing this.  He is aware it has contributed to his cognition, imbalance, and falls. He arrived today in a transport chair.  He reports having bilateral leg weakness and will be starting PT next month.     UPDATE  08/19/2023:  He is here for follow-up. He is here alone.  He has filed for divorce with his wife and now is living alone.  He does not want her to "touch my IRA".  He tells me that his lawyer has financial POA and manages his finances.  He does his own medications.  He has help with transportation to appointments.  No new issues with tremor or trigeminal neuralgia which is stable.  He is drinking 4-5 oz of liquor daily and has been doing this since the age of 49.   Medications:  Current Outpatient Medications on File Prior to Visit  Medication Sig Dispense Refill   acetaminophen (TYLENOL) 500 MG tablet Take 1,000 mg by mouth every 6 (six) hours as needed for mild pain or headache.     clopidogrel (PLAVIX) 75 MG tablet Take 1 tablet (75 mg total) by mouth daily. 30 tablet 2   cyclobenzaprine (FLEXERIL) 10 MG tablet Take  10 mg by mouth 3 (three) times daily as needed for muscle spasms.     enoxaparin (LOVENOX) 80 MG/0.8ML injection Inject 0.8 mLs (80 mg total) into the skin 2 (two) times daily. 48 mL 11   furosemide (LASIX) 40 MG tablet Take 20-40 mg by mouth See admin instructions. Take 40 mg by mouth once a day and an additional 20 mg as needed for leg swelling     lamoTRIgine (LAMICTAL) 200 MG tablet Take 200 mg by mouth at bedtime.  1   memantine (NAMENDA) 10 MG tablet Take 1 tablet twice daily. (Patient taking differently: Take 10 mg by mouth 2 (two) times daily.) 60 tablet 3   oxyCODONE-acetaminophen (PERCOCET) 10-325 MG tablet Take 1 tablet by mouth every 12 (twelve) hours as needed for pain.     pregabalin (LYRICA) 100 MG capsule TAKE 1 CAPSULE BY MOUTH 2 TIMES A DAY 60 capsule 5   primidone (MYSOLINE) 50 MG tablet TAKE 2 TABLETS BY MOUTH TWICE A DAY 360 tablet 1   propranolol ER (INDERAL LA) 120 MG 24 hr capsule TAKE 1 CAPSULE BY MOUTH 2 TIMES A DAY OK TO TAKE EXTRA CAPSULE AS NEEDED FOR WORSENING TREMOR 200 capsule 0   sildenafil (VIAGRA) 100 MG tablet Take 1 tablet (100 mg total) by mouth  daily as needed for erectile dysfunction. 12 tablet 11   zolpidem (AMBIEN CR) 12.5 MG CR tablet Take 12.5 mg by mouth at bedtime as needed (for insomnia).     No current facility-administered medications on file prior to visit.    Allergies: No Known Allergies  Vital Signs:  BP 113/69   Pulse 84   Ht 5\' 10"  (1.778 m)   Wt 158 lb (71.7 kg)   SpO2 96%   BMI 22.67 kg/m    Neurological Exam: MENTAL STATUS including orientation to year, month, day (missed date by one day). Normal attention span and concentration, language, and fund of knowledge is normal.  Speech is not dysarthric.  CRANIAL NERVES:  Pupils equal round and reactive to light.  Normal conjugate, extra-ocular eye movements in all directions of gaze.  Mild left ptosis (old).  Face is symmetric.   MOTOR:  Motor strength is 5/5 in all extremities, except 4/5 bilateral hip flexors. Mild postural tremor on the right > left hand.  No atrophy, fasciculations or abnormal movements.  No pronator drift.  Tone is normal.    MSRs:                                           Right        Left brachioradialis 2+  2+  biceps 2+  2+  triceps 2+  2+  patellar 2+  2+  ankle jerk 0  0   COORDINATION/GAIT:  Normal finger-to- nose-finger.  Intact rapid alternating movements bilaterally. Gait not tested  Data: AChR blocking and binding 11/02/2012 - neg  CSF 06/02/2012:  R0  W1  G63  P43  IgG index 0.43   OCB -neg  MRI brain wwo contrast 07/08/2009: 1. Advanced white matter disease in a pattern compatible with multiple sclerosis. Mild progression since 2004. No areas of   enhancement.   2. No acute intracranial abnormality.  MRI thoracic and lumbar spine 03/16/2021: 1. Postsurgical changes from laminectomy at the T2-3 level with interval resolution of spinal canal stenosis. Improvement of the neural foraminal narrowing  at this level, now mild bilaterally. 2. Normal appearance of the thoracic spinal cord on T2 weighted images with  heterogeneous signal on post contrast images in the cord from the C6 through the T2 level which is favored to represent artifact rather than contrast enhancement. However, pathological contrast enhancement cannot be excluded. In case of high clinical concern for cord lesion at this level, repeat images are recommended. 3. Acute/subacute compression fracture of the superior endplate of L2 with approximately 30% loss of vertebral body height without retropulsion. 4. Degenerative changes and anterolisthesis at L5-S1 resulting in severe narrowing of the left subarticular zone and severe left neural foraminal narrowing. 5. 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  MRI cervical spine wo contrast  03/16/2021: 1. Interval ACDF of C3-4 with slight retrolisthesis of C3 on C4. Mild canal stenosis at this level has slightly improved from prior. 2. Prior ACDF at C4-5 and C5-6 with solid arthrodesis. No residual canal stenosis. 3. Stable degenerative changes of the cervical spine including borderline-mild canal stenosis at C7-T1 with moderate left and mild right foraminal stenosis. 4. Diffuse marrow heterogeneity without discrete marrow replacing lesion. Findings are nonspecific but can be seen in the setting of chronic anemia, smoking, and/or obesity.   MRI brain wo contrast 01/03/2023: 1. Moderate parenchymal volume loss without discernible lobar predilection or disproportionate hippocampal atrophy. 2. Moderate to advanced underlying chronic small-vessel ischemic change. 3. No acute finding.   Thank you for allowing me to participate in patient's care.  If I can answer any additional questions, I would be pleased to do so.    Sincerely,    Joelys Staubs K. Allena Katz, DO

## 2023-08-20 DIAGNOSIS — F3170 Bipolar disorder, currently in remission, most recent episode unspecified: Secondary | ICD-10-CM | POA: Diagnosis not present

## 2023-08-20 DIAGNOSIS — J4489 Other specified chronic obstructive pulmonary disease: Secondary | ICD-10-CM | POA: Diagnosis not present

## 2023-08-20 DIAGNOSIS — G309 Alzheimer's disease, unspecified: Secondary | ICD-10-CM | POA: Diagnosis not present

## 2023-08-20 DIAGNOSIS — F02A3 Dementia in other diseases classified elsewhere, mild, with mood disturbance: Secondary | ICD-10-CM | POA: Diagnosis not present

## 2023-08-20 DIAGNOSIS — I10 Essential (primary) hypertension: Secondary | ICD-10-CM | POA: Diagnosis not present

## 2023-08-20 DIAGNOSIS — F02A4 Dementia in other diseases classified elsewhere, mild, with anxiety: Secondary | ICD-10-CM | POA: Diagnosis not present

## 2023-08-20 DIAGNOSIS — L89892 Pressure ulcer of other site, stage 2: Secondary | ICD-10-CM | POA: Diagnosis not present

## 2023-08-20 DIAGNOSIS — G500 Trigeminal neuralgia: Secondary | ICD-10-CM | POA: Diagnosis not present

## 2023-08-20 DIAGNOSIS — F419 Anxiety disorder, unspecified: Secondary | ICD-10-CM | POA: Diagnosis not present

## 2023-08-22 DIAGNOSIS — G309 Alzheimer's disease, unspecified: Secondary | ICD-10-CM | POA: Diagnosis not present

## 2023-08-22 DIAGNOSIS — J4489 Other specified chronic obstructive pulmonary disease: Secondary | ICD-10-CM | POA: Diagnosis not present

## 2023-08-22 DIAGNOSIS — F02A3 Dementia in other diseases classified elsewhere, mild, with mood disturbance: Secondary | ICD-10-CM | POA: Diagnosis not present

## 2023-08-22 DIAGNOSIS — L89892 Pressure ulcer of other site, stage 2: Secondary | ICD-10-CM | POA: Diagnosis not present

## 2023-08-22 DIAGNOSIS — F3170 Bipolar disorder, currently in remission, most recent episode unspecified: Secondary | ICD-10-CM | POA: Diagnosis not present

## 2023-08-22 DIAGNOSIS — G500 Trigeminal neuralgia: Secondary | ICD-10-CM | POA: Diagnosis not present

## 2023-08-22 DIAGNOSIS — F02A4 Dementia in other diseases classified elsewhere, mild, with anxiety: Secondary | ICD-10-CM | POA: Diagnosis not present

## 2023-08-22 DIAGNOSIS — I10 Essential (primary) hypertension: Secondary | ICD-10-CM | POA: Diagnosis not present

## 2023-08-22 DIAGNOSIS — F419 Anxiety disorder, unspecified: Secondary | ICD-10-CM | POA: Diagnosis not present

## 2023-08-25 ENCOUNTER — Other Ambulatory Visit: Payer: Self-pay | Admitting: Neurology

## 2023-08-26 DIAGNOSIS — J4489 Other specified chronic obstructive pulmonary disease: Secondary | ICD-10-CM | POA: Diagnosis not present

## 2023-08-26 DIAGNOSIS — F419 Anxiety disorder, unspecified: Secondary | ICD-10-CM | POA: Diagnosis not present

## 2023-08-26 DIAGNOSIS — F02A4 Dementia in other diseases classified elsewhere, mild, with anxiety: Secondary | ICD-10-CM | POA: Diagnosis not present

## 2023-08-26 DIAGNOSIS — G500 Trigeminal neuralgia: Secondary | ICD-10-CM | POA: Diagnosis not present

## 2023-08-26 DIAGNOSIS — Z79891 Long term (current) use of opiate analgesic: Secondary | ICD-10-CM | POA: Diagnosis not present

## 2023-08-26 DIAGNOSIS — M7918 Myalgia, other site: Secondary | ICD-10-CM | POA: Diagnosis not present

## 2023-08-26 DIAGNOSIS — F02A3 Dementia in other diseases classified elsewhere, mild, with mood disturbance: Secondary | ICD-10-CM | POA: Diagnosis not present

## 2023-08-26 DIAGNOSIS — L89892 Pressure ulcer of other site, stage 2: Secondary | ICD-10-CM | POA: Diagnosis not present

## 2023-08-26 DIAGNOSIS — F3170 Bipolar disorder, currently in remission, most recent episode unspecified: Secondary | ICD-10-CM | POA: Diagnosis not present

## 2023-08-26 DIAGNOSIS — G309 Alzheimer's disease, unspecified: Secondary | ICD-10-CM | POA: Diagnosis not present

## 2023-08-26 DIAGNOSIS — M47812 Spondylosis without myelopathy or radiculopathy, cervical region: Secondary | ICD-10-CM | POA: Diagnosis not present

## 2023-08-26 DIAGNOSIS — I10 Essential (primary) hypertension: Secondary | ICD-10-CM | POA: Diagnosis not present

## 2023-08-26 DIAGNOSIS — Z981 Arthrodesis status: Secondary | ICD-10-CM | POA: Diagnosis not present

## 2023-08-29 DIAGNOSIS — F02A4 Dementia in other diseases classified elsewhere, mild, with anxiety: Secondary | ICD-10-CM | POA: Diagnosis not present

## 2023-08-29 DIAGNOSIS — F02A3 Dementia in other diseases classified elsewhere, mild, with mood disturbance: Secondary | ICD-10-CM | POA: Diagnosis not present

## 2023-08-29 DIAGNOSIS — F419 Anxiety disorder, unspecified: Secondary | ICD-10-CM | POA: Diagnosis not present

## 2023-08-29 DIAGNOSIS — I10 Essential (primary) hypertension: Secondary | ICD-10-CM | POA: Diagnosis not present

## 2023-08-29 DIAGNOSIS — F3170 Bipolar disorder, currently in remission, most recent episode unspecified: Secondary | ICD-10-CM | POA: Diagnosis not present

## 2023-08-29 DIAGNOSIS — G500 Trigeminal neuralgia: Secondary | ICD-10-CM | POA: Diagnosis not present

## 2023-08-29 DIAGNOSIS — G309 Alzheimer's disease, unspecified: Secondary | ICD-10-CM | POA: Diagnosis not present

## 2023-08-29 DIAGNOSIS — L89892 Pressure ulcer of other site, stage 2: Secondary | ICD-10-CM | POA: Diagnosis not present

## 2023-08-29 DIAGNOSIS — J4489 Other specified chronic obstructive pulmonary disease: Secondary | ICD-10-CM | POA: Diagnosis not present

## 2023-09-03 DIAGNOSIS — G309 Alzheimer's disease, unspecified: Secondary | ICD-10-CM | POA: Diagnosis not present

## 2023-09-03 DIAGNOSIS — F3170 Bipolar disorder, currently in remission, most recent episode unspecified: Secondary | ICD-10-CM | POA: Diagnosis not present

## 2023-09-03 DIAGNOSIS — F419 Anxiety disorder, unspecified: Secondary | ICD-10-CM | POA: Diagnosis not present

## 2023-09-03 DIAGNOSIS — G500 Trigeminal neuralgia: Secondary | ICD-10-CM | POA: Diagnosis not present

## 2023-09-03 DIAGNOSIS — F02A4 Dementia in other diseases classified elsewhere, mild, with anxiety: Secondary | ICD-10-CM | POA: Diagnosis not present

## 2023-09-03 DIAGNOSIS — F02A3 Dementia in other diseases classified elsewhere, mild, with mood disturbance: Secondary | ICD-10-CM | POA: Diagnosis not present

## 2023-09-03 DIAGNOSIS — I10 Essential (primary) hypertension: Secondary | ICD-10-CM | POA: Diagnosis not present

## 2023-09-03 DIAGNOSIS — L89892 Pressure ulcer of other site, stage 2: Secondary | ICD-10-CM | POA: Diagnosis not present

## 2023-09-03 DIAGNOSIS — J4489 Other specified chronic obstructive pulmonary disease: Secondary | ICD-10-CM | POA: Diagnosis not present

## 2023-09-04 DIAGNOSIS — G8929 Other chronic pain: Secondary | ICD-10-CM | POA: Diagnosis not present

## 2023-09-04 DIAGNOSIS — Z6828 Body mass index (BMI) 28.0-28.9, adult: Secondary | ICD-10-CM | POA: Diagnosis not present

## 2023-09-04 DIAGNOSIS — F02A4 Dementia in other diseases classified elsewhere, mild, with anxiety: Secondary | ICD-10-CM | POA: Diagnosis not present

## 2023-09-04 DIAGNOSIS — F3170 Bipolar disorder, currently in remission, most recent episode unspecified: Secondary | ICD-10-CM | POA: Diagnosis not present

## 2023-09-04 DIAGNOSIS — G500 Trigeminal neuralgia: Secondary | ICD-10-CM | POA: Diagnosis not present

## 2023-09-04 DIAGNOSIS — I10 Essential (primary) hypertension: Secondary | ICD-10-CM | POA: Diagnosis not present

## 2023-09-04 DIAGNOSIS — I252 Old myocardial infarction: Secondary | ICD-10-CM | POA: Diagnosis not present

## 2023-09-04 DIAGNOSIS — L89892 Pressure ulcer of other site, stage 2: Secondary | ICD-10-CM | POA: Diagnosis not present

## 2023-09-04 DIAGNOSIS — G309 Alzheimer's disease, unspecified: Secondary | ICD-10-CM | POA: Diagnosis not present

## 2023-09-04 DIAGNOSIS — M542 Cervicalgia: Secondary | ICD-10-CM | POA: Diagnosis not present

## 2023-09-04 DIAGNOSIS — J4489 Other specified chronic obstructive pulmonary disease: Secondary | ICD-10-CM | POA: Diagnosis not present

## 2023-09-04 DIAGNOSIS — F02A3 Dementia in other diseases classified elsewhere, mild, with mood disturbance: Secondary | ICD-10-CM | POA: Diagnosis not present

## 2023-09-04 DIAGNOSIS — J449 Chronic obstructive pulmonary disease, unspecified: Secondary | ICD-10-CM | POA: Diagnosis not present

## 2023-09-04 DIAGNOSIS — F419 Anxiety disorder, unspecified: Secondary | ICD-10-CM | POA: Diagnosis not present

## 2023-09-04 DIAGNOSIS — I236 Thrombosis of atrium, auricular appendage, and ventricle as current complications following acute myocardial infarction: Secondary | ICD-10-CM | POA: Diagnosis not present

## 2023-09-04 DIAGNOSIS — G250 Essential tremor: Secondary | ICD-10-CM | POA: Diagnosis not present

## 2023-09-08 ENCOUNTER — Ambulatory Visit: Attending: Internal Medicine | Admitting: Internal Medicine

## 2023-09-08 ENCOUNTER — Encounter: Payer: Self-pay | Admitting: Internal Medicine

## 2023-09-08 ENCOUNTER — Telehealth: Payer: Self-pay

## 2023-09-08 VITALS — BP 130/68 | HR 59 | Ht 70.0 in | Wt 185.4 lb

## 2023-09-08 DIAGNOSIS — I5020 Unspecified systolic (congestive) heart failure: Secondary | ICD-10-CM | POA: Insufficient documentation

## 2023-09-08 DIAGNOSIS — R296 Repeated falls: Secondary | ICD-10-CM | POA: Insufficient documentation

## 2023-09-08 DIAGNOSIS — R410 Disorientation, unspecified: Secondary | ICD-10-CM

## 2023-09-08 DIAGNOSIS — I513 Intracardiac thrombosis, not elsewhere classified: Secondary | ICD-10-CM | POA: Insufficient documentation

## 2023-09-08 DIAGNOSIS — R55 Syncope and collapse: Secondary | ICD-10-CM

## 2023-09-08 DIAGNOSIS — W19XXXA Unspecified fall, initial encounter: Secondary | ICD-10-CM

## 2023-09-08 MED ORDER — SPIRONOLACTONE 25 MG PO TABS: 12.5000 mg | ORAL_TABLET | Freq: Every day | ORAL | 3 refills | Status: AC

## 2023-09-08 NOTE — Patient Instructions (Addendum)
 Medication Instructions:  START Spironolactone  12.5 mg daily   *If you need a refill on your cardiac medications before your next appointment, please call your pharmacy*  Testing/Procedures: Echo  Your physician has requested that you have an echocardiogram. Echocardiography is a painless test that uses sound waves to create images of your heart. It provides your doctor with information about the size and shape of your heart and how well your heart's chambers and valves are working. This procedure takes approximately one hour. There are no restrictions for this procedure. Please do NOT wear cologne, perfume, aftershave, or lotions (deodorant is allowed). Please arrive 15 minutes prior to your appointment time.  Please note: We ask at that you not bring children with you during ultrasound (echo/ vascular) testing. Due to room size and safety concerns, children are not allowed in the ultrasound rooms during exams. Our front office staff cannot provide observation of children in our lobby area while testing is being conducted. An adult accompanying a patient to their appointment will only be allowed in the ultrasound room at the discretion of the ultrasound technician under special circumstances. We apologize for any inconvenience.   Follow-Up: At [facility], you and your health needs are our priority.  As part of our continuing mission to provide you with exceptional heart care, our providers are all part of one team.  This team includes your primary Cardiologist (physician) and Advanced Practice Providers or APPs (Physician Assistants and Nurse Practitioners) who all work together to provide you with the care you need, when you need it.  Your next appointment:   2 month(s)  Provider:   Dr. Chancy Comber or APP  We recommend signing up for the patient portal called "MyChart".  Sign up information is provided on this After Visit Summary.  MyChart is used to connect with patients for Virtual  Visits (Telemedicine).  Patients are able to view lab/test results, encounter notes, upcoming appointments, etc.  Non-urgent messages can be sent to your provider as well.   To learn more about what you can do with MyChart, go to [email or web address].   Other Instructions      1st Floor: - Lobby - Registration  - Pharmacy  - Lab - Cafe  2nd Floor: - PV Lab - Diagnostic Testing (echo, CT, nuclear med)  3rd Floor: - Vacant  4th Floor: - TCTS (cardiothoracic surgery) - AFib Clinic - Structural Heart Clinic - Vascular Surgery  - Vascular Ultrasound  5th Floor: - HeartCare Cardiology (general and EP) - Clinical Pharmacy for coumadin, hypertension, lipid, weight-loss medications, and med management appointments    Valet parking services will be available as well.

## 2023-09-08 NOTE — Progress Notes (Signed)
 Cardiology Office Note:  .    Date:  09/08/2023  ID:  Gabriel Richardson, DOB [birth year], MRN [id number] PCP: Helyn Lobstein, MD  [facility] Providers Cardiologist:  Euell Herrlich, MD     CC: DOD Hospital follow up  History of Present Illness: .    Gabriel Richardson is a 78 year old male with heart failure with reduced ejection fraction who presents for follow-up after hospitalization for myopericarditis.  He was recently hospitalized and diagnosed with myopericarditis. During his hospital stay, he experienced agitation and hallucinations. Initially on anticoagulation, he was deescalated to Plavix  due to frequent falls, which occur up to ten times a month. He has difficulty getting back to the bed or couch after falling.  He has a history of heart failure with reduced ejection fraction and a prior left ventricular thrombus. His echocardiogram showed mildly decreased ejection fraction and LAD territory wall motion abnormalities. He was not a candidate for heart catheterization and did not undergo a cardiac MRI. His troponin levels were elevated but decreased to 38,519.  No chest pain or breathing issues, but significant leg and foot swelling, particularly in the left foot and ankle, prevents him from wearing socks. He takes Lasix (furosemide) once a day for fluid management.  He has a history of alcohol use, consuming two [place] a day, which he has reduced to one. He is considering changing his power of attorney to his next-door neighbor due to issues with his current power of attorney's availability.  He cannot drive due to spinal problems that limit his neck movement, and he uses [employer] for transportation. He stabilizes himself by holding onto furniture but sometimes falls in the bathroom, leading to emergency room visits.  Discussed the use of AI scribe software for clinical note transcription with the patient, who gave verbal consent to proceed.   Relevant histories: .  Social-  he is in between H-POAs ROS: As per HPI.   Studies Reviewed: .   Cardiac Studies & Procedures   ______________________________________________________________________________________________     ECHOCARDIOGRAM  ECHOCARDIOGRAM COMPLETE 07/28/2023  Narrative ECHOCARDIOGRAM REPORT    Patient Name:   Gabriel Richardson Date of Exam: 07/28/2023 Medical Rec #:  [id number]     Height:       70.0 in Accession #:    [id number]    Weight:       176.4 lb Date of Birth:  01/01/1946     BSA:          1.979 m Patient Age:    77 years      BP:           101/75 mmHg Patient Gender: M             HR:           99 bpm. Exam Location:  Inpatient  Procedure: 2D Echo, Color Doppler and Cardiac Doppler (Both Spectral and Color Flow Doppler were utilized during procedure).  Indications:    Syncope R55  History:        Patient has no prior history of Echocardiogram examinations. COPD.  Sonographer:    Hersey Lorenzo RDCS Referring Phys: [id number] ROBERT DORRELL  IMPRESSIONS   1. Left ventricular ejection fraction, by estimation, is 40 to 45%. The left ventricle has mildly decreased function. The left ventricle demonstrates regional wall motion abnormalities (see scoring diagram/findings for description). Left ventricular diastolic parameters are indeterminate. 2. Right ventricular systolic function is normal. The right ventricular size is normal.  3. The mitral valve is normal in structure. Trivial mitral valve regurgitation. No evidence of mitral stenosis. 4. The aortic valve was not well visualized. Aortic valve regurgitation is not visualized. No aortic stenosis is present. 5. Aortic dilatation noted. There is borderline dilatation of the aortic root, measuring 39 mm. 6. The inferior vena cava is normal in size with greater than 50% respiratory variability, suggesting right atrial pressure of 3 mmHg.  Conclusion(s)/Recommendation(s): The base to mid ventricle are hyperdynamic. The distal LV and apex  are akinetic with prominent laminated LV clot. Findings concerning for underlying mid to distal LAD occlusion.  FINDINGS Left Ventricle: Left ventricular ejection fraction, by estimation, is 40 to 45%. The left ventricle has mildly decreased function. The left ventricle demonstrates regional wall motion abnormalities. The left ventricular internal cavity size was normal in size. There is no left ventricular hypertrophy. Left ventricular diastolic parameters are indeterminate.   LV Wall Scoring: The mid and distal lateral wall, mid and distal anterior septum, entire apex, and mid and distal inferior wall are akinetic.  Right Ventricle: The right ventricular size is normal. No increase in right ventricular wall thickness. Right ventricular systolic function is normal.  Left Atrium: Left atrial size was normal in size.  Right Atrium: Right atrial size was normal in size.  Pericardium: There is no evidence of pericardial effusion.  Mitral Valve: The mitral valve is normal in structure. Trivial mitral valve regurgitation. No evidence of mitral valve stenosis.  Tricuspid Valve: The tricuspid valve is grossly normal. Tricuspid valve regurgitation is trivial. No evidence of tricuspid stenosis.  Aortic Valve: The aortic valve was not well visualized. Aortic valve regurgitation is not visualized. No aortic stenosis is present.  Pulmonic Valve: The pulmonic valve was not well visualized. Pulmonic valve regurgitation is not visualized. No evidence of pulmonic stenosis.  Aorta: Aortic dilatation noted and the aortic root was not well visualized. There is borderline dilatation of the aortic root, measuring 39 mm.  Venous: The inferior vena cava is normal in size with greater than 50% respiratory variability, suggesting right atrial pressure of 3 mmHg.  IAS/Shunts: No atrial level shunt detected by color flow Doppler.   LEFT VENTRICLE PLAX 2D LVIDd:         4.40 cm LVIDs:         2.80 cm LV  PW:         1.00 cm LV IVS:        1.10 cm LVOT diam:     2.20 cm LV SV:         103 LV SV Index:   52 LVOT Area:     3.80 cm   RIGHT VENTRICLE             IVC RV S prime:     20.20 cm/s  IVC diam: 2.00 cm  LEFT ATRIUM         Index LA diam:    2.90 cm 1.47 cm/m AORTIC VALVE LVOT Vmax:   170.00 cm/s LVOT Vmean:  121.000 cm/s LVOT VTI:    0.271 m  AORTA Ao Root diam: 3.90 cm Ao Asc diam:  3.90 cm   SHUNTS Systemic VTI:  0.27 m Systemic Diam: 2.20 cm  Jules Oar MD Electronically signed by Jules Oar MD Signature Date/Time: 07/28/2023/2:07:01 PM    Final          ______________________________________________________________________________________________       Physical Exam:    VS:  BP 130/68 (BP Location: Right Arm)  Pulse (!) 59   Ht 5\' 10"  (1.778 m)   Wt 84.1 kg   SpO2 96%   BMI 26.60 kg/m    Wt Readings from Last 3 Encounters:  09/08/23 84.1 kg  08/19/23 71.7 kg  07/27/23 80 kg    Gen: Chronically ill appearing Neck: No JVD Cardiac: No Rubs or Gallops, no murmur, regular bradycardia +2 radial pulses Respiratory: Clear to auscultation bilaterally, normal effort, normal  respiratory rate GI: Soft, nontender, non-distended  MS: +1 bilateral edema;  moves all extremities Integument: Skin feels warm Neuro:  At time of evaluation, alert and oriented to person/place/time/situation  Psych: Normal affect, patient feels fine   ASSESSMENT AND PLAN: .    Heart failure with reduced ejection fraction - NYHA II, Stage C, likely related to CAD Heart failure with reduced ejection fraction, echocardiogram indicating previous myocardial infarction with LAD territory wall motion abnormalities. Limited GDMT due to BP.  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 Previous treatment deescalated to Plavix . - Repeat limited echocardiogram to assess LV apical thrombus - high risk for anticoagulation; high risk for stroke  Leg swelling Leg and foot swelling, particularly in the left foot and ankle, managed with Lasix. Additional diuretic therapy considered. - Start spironolactone  12.5 mg PO daily - Continue current Lasix regimen  Frequent falls Frequent falls, approximately ten times a month, leading to emergency room visits. Concerns about safety and risk of injury. Uses a cane and relies on furniture for stability. Not driving due to spinal issues and fall risk. - Discussed the importance of safety and fall prevention   Alcohol use Consumes one [place] daily, reduced from two. Discussed potential health impact and encouraged moderation. - Discussed alcohol use and encouraged cessation  Goals of Care Desires to live to age 83, values therapeutic treatment, and is transitioning healthcare power of attorney to a more available neighbor. - I do not believe this is a realistic goal; he was not amenable to palliative care referral (outpatient) he currently does not have a health care POA  Will arrange follow up wit Dr. Miller Allis APP team in 2-3 months  Time Spent Directly with Patient:   I have spent a total of 45 minutes with the patient reviewing notes, imaging, EKGs, labs,  and examining the patient as well as establishing an assessment and plan that was discussed personally with the patient. Discussed his alcohol use.  Discussed his memory issues.  Discussed his goals of care   Gloriann Larger, MD FASE [facility] Cardiologist [facility]  [address] [place], [address] [place], [place] [address] [phone]  3:54 PM

## 2023-09-11 DIAGNOSIS — F02A3 Dementia in other diseases classified elsewhere, mild, with mood disturbance: Secondary | ICD-10-CM | POA: Diagnosis not present

## 2023-09-11 DIAGNOSIS — F02A4 Dementia in other diseases classified elsewhere, mild, with anxiety: Secondary | ICD-10-CM | POA: Diagnosis not present

## 2023-09-11 DIAGNOSIS — G309 Alzheimer's disease, unspecified: Secondary | ICD-10-CM | POA: Diagnosis not present

## 2023-09-11 DIAGNOSIS — I10 Essential (primary) hypertension: Secondary | ICD-10-CM | POA: Diagnosis not present

## 2023-09-11 DIAGNOSIS — L89892 Pressure ulcer of other site, stage 2: Secondary | ICD-10-CM | POA: Diagnosis not present

## 2023-09-11 DIAGNOSIS — J4489 Other specified chronic obstructive pulmonary disease: Secondary | ICD-10-CM | POA: Diagnosis not present

## 2023-09-11 DIAGNOSIS — F3170 Bipolar disorder, currently in remission, most recent episode unspecified: Secondary | ICD-10-CM | POA: Diagnosis not present

## 2023-09-11 DIAGNOSIS — F419 Anxiety disorder, unspecified: Secondary | ICD-10-CM | POA: Diagnosis not present

## 2023-09-11 DIAGNOSIS — G500 Trigeminal neuralgia: Secondary | ICD-10-CM | POA: Diagnosis not present

## 2023-09-16 DIAGNOSIS — F02A3 Dementia in other diseases classified elsewhere, mild, with mood disturbance: Secondary | ICD-10-CM | POA: Diagnosis not present

## 2023-09-16 DIAGNOSIS — F02A4 Dementia in other diseases classified elsewhere, mild, with anxiety: Secondary | ICD-10-CM | POA: Diagnosis not present

## 2023-09-16 DIAGNOSIS — F419 Anxiety disorder, unspecified: Secondary | ICD-10-CM | POA: Diagnosis not present

## 2023-09-16 DIAGNOSIS — J4489 Other specified chronic obstructive pulmonary disease: Secondary | ICD-10-CM | POA: Diagnosis not present

## 2023-09-16 DIAGNOSIS — G309 Alzheimer's disease, unspecified: Secondary | ICD-10-CM | POA: Diagnosis not present

## 2023-09-16 DIAGNOSIS — L89892 Pressure ulcer of other site, stage 2: Secondary | ICD-10-CM | POA: Diagnosis not present

## 2023-09-16 DIAGNOSIS — G500 Trigeminal neuralgia: Secondary | ICD-10-CM | POA: Diagnosis not present

## 2023-09-16 DIAGNOSIS — F3170 Bipolar disorder, currently in remission, most recent episode unspecified: Secondary | ICD-10-CM | POA: Diagnosis not present

## 2023-09-16 DIAGNOSIS — I10 Essential (primary) hypertension: Secondary | ICD-10-CM | POA: Diagnosis not present

## 2023-09-17 ENCOUNTER — Telehealth: Payer: Self-pay | Admitting: *Deleted

## 2023-09-17 NOTE — Progress Notes (Signed)
 Complex Care Management Note  Care Guide Note 09/17/2023 Name: ARLYN JESSUP MRN: [id number] DOB: November 14, 1945  DAMION JAUCH is a 78 y.o. year old male who sees Helyn Lobstein, MD for primary care. I reached out to Layne Prim by phone today to offer complex care management services.  Mr. Guedes was given information about Complex Care Management services today including:   The Complex Care Management services include support from the care team which includes your Nurse Care Manager, Clinical Social Worker, or Pharmacist.  The Complex Care Management team is here to help remove barriers to the health concerns and goals most important to you. Complex Care Management services are voluntary, and the patient may decline or stop services at any time by request to their care team member.   Complex Care Management Consent Status: Patient agreed to services and verbal consent obtained.   Follow up plan:  Telephone appointment with complex care management team member scheduled for:  10/06/23  Encounter Outcome:  Patient Scheduled  Barnie Bora  [facility]  [facility], [facility] Guide  Direct Dial: [phone]  Fax [phone]

## 2023-09-18 DIAGNOSIS — J4489 Other specified chronic obstructive pulmonary disease: Secondary | ICD-10-CM | POA: Diagnosis not present

## 2023-09-18 DIAGNOSIS — F3170 Bipolar disorder, currently in remission, most recent episode unspecified: Secondary | ICD-10-CM | POA: Diagnosis not present

## 2023-09-18 DIAGNOSIS — F419 Anxiety disorder, unspecified: Secondary | ICD-10-CM | POA: Diagnosis not present

## 2023-09-18 DIAGNOSIS — G500 Trigeminal neuralgia: Secondary | ICD-10-CM | POA: Diagnosis not present

## 2023-09-18 DIAGNOSIS — L89892 Pressure ulcer of other site, stage 2: Secondary | ICD-10-CM | POA: Diagnosis not present

## 2023-09-18 DIAGNOSIS — G309 Alzheimer's disease, unspecified: Secondary | ICD-10-CM | POA: Diagnosis not present

## 2023-09-18 DIAGNOSIS — F02A3 Dementia in other diseases classified elsewhere, mild, with mood disturbance: Secondary | ICD-10-CM | POA: Diagnosis not present

## 2023-09-18 DIAGNOSIS — I10 Essential (primary) hypertension: Secondary | ICD-10-CM | POA: Diagnosis not present

## 2023-09-18 DIAGNOSIS — F02A4 Dementia in other diseases classified elsewhere, mild, with anxiety: Secondary | ICD-10-CM | POA: Diagnosis not present

## 2023-09-21 ENCOUNTER — Other Ambulatory Visit: Payer: Self-pay | Admitting: Neurology

## 2023-09-23 DIAGNOSIS — G250 Essential tremor: Secondary | ICD-10-CM | POA: Diagnosis not present

## 2023-09-23 DIAGNOSIS — I5020 Unspecified systolic (congestive) heart failure: Secondary | ICD-10-CM | POA: Diagnosis not present

## 2023-09-23 DIAGNOSIS — I236 Thrombosis of atrium, auricular appendage, and ventricle as current complications following acute myocardial infarction: Secondary | ICD-10-CM | POA: Diagnosis not present

## 2023-09-23 DIAGNOSIS — F3170 Bipolar disorder, currently in remission, most recent episode unspecified: Secondary | ICD-10-CM | POA: Diagnosis not present

## 2023-09-23 DIAGNOSIS — R600 Localized edema: Secondary | ICD-10-CM | POA: Diagnosis not present

## 2023-09-23 DIAGNOSIS — I252 Old myocardial infarction: Secondary | ICD-10-CM | POA: Diagnosis not present

## 2023-09-23 DIAGNOSIS — G8929 Other chronic pain: Secondary | ICD-10-CM | POA: Diagnosis not present

## 2023-09-23 DIAGNOSIS — Z79899 Other long term (current) drug therapy: Secondary | ICD-10-CM | POA: Diagnosis not present

## 2023-10-01 ENCOUNTER — Other Ambulatory Visit: Payer: Self-pay

## 2023-10-01 NOTE — Patient Outreach (Signed)
 Complex Care Management   Visit Note  10/01/2023  Name:  Gabriel Richardson MRN: [id number] DOB: December 19, 1945  Situation: Referral received for Complex Care Management related to Heart Failure and high fall risk I obtained verbal consent from Patient.  Visit completed with Page Boast  on the phone  Background:   Past Medical History:  Diagnosis Date   Ankle fracture    right with syndesmotic disruption   Anxiety    Arthritis    Asthma    Bipolar disorder (HCC)    Cervical stenosis of spinal canal    Closed fracture of right distal fibula 01/07/2019   COPD (chronic obstructive pulmonary disease) (HCC)    Depression    Emphysema of lung (HCC)    Neuromuscular disorder (HCC)    Trigeminal neuralgia    Wears glasses     Assessment: Patient Reported Symptoms:  Cognitive Cognitive Status: Struggling with memory recall, Able to follow simple commands (Patient reports he has been struggling with memory loss for about 6 months. He has discussed with PCP) Cognitive/Intellectual Conditions Management [RPT]: None reported or documented in medical history or problem list   Health Maintenance Behaviors: Annual physical exam  Neurological Neurological Review of Symptoms: No symptoms reported    HEENT HEENT Symptoms Reported: No symptoms reported      Cardiovascular Cardiovascular Symptoms Reported: Swelling in legs or feet (Swelling L foot, less than normal per patient. Note that he has recently started on Spironolactone  by cardiology for swelling) Does patient have uncontrolled Hypertension?: No (Patient does not have BP cuff. He reports that [facility] has been checking with each visit.) Cardiovascular Conditions: Heart failure, Hypertension Cardiovascular Management Strategies: Medication therapy, Adequate rest, Diet modification Cardiovascular Comment: Note that patient has ECHO scheduled 10/09/23. Reminded of appointment.  Respiratory Respiratory Symptoms Reported: No symptoms reported Respiratory  Conditions: Asthma  Endocrine Patient reports the following symptoms related to hypoglycemia or hyperglycemia : No symptoms reported Is patient diabetic?: No    Gastrointestinal Gastrointestinal Symptoms Reported: No symptoms reported (Last BM today) Additional Gastrointestinal Details: Patient reports a good appetite. He eats a small meal for breakfast, large lunch, and occasional small dinner.   Nutrition Risk Screen (CP): No indicators present  Genitourinary Genitourinary Symptoms Reported: No symptoms reported    Integumentary Integumentary Symptoms Reported: Other, Bruising Other Integumentary Symptoms: "Aging spots" on his arms Skin Management Strategies: Routine screening (Sees dermatology)  Musculoskeletal Musculoskelatal Symptoms Reviewed: Weakness Musculoskeletal Conditions: Other Other Musculoskeletal Conditions: Tremor Musculoskeletal Management Strategies: Medical device, Adequate rest Falls in the past year?: Yes Number of falls in past year: 2 or more Was there an injury with Fall?: No Fall Risk Category Calculator: 2 Patient Fall Risk Level: Moderate Fall Risk Patient at Risk for Falls Due to: History of fall(s), Impaired balance/gait, Impaired vision, Medication side effect Fall risk Follow up: Falls evaluation completed, Education provided, Falls prevention discussed (Patient reports that he has a life alert system)  Psychosocial Psychosocial Symptoms Reported: Substance use Behavioral Health Conditions: Depression, Substance use Substance Use: Alcohol (Patient reports trying to cut back. Reports drinking about 1.5 liquor drinks a day) Behavioral Management Strategies: Support system (Trying to cut back on alcohol consumption)   Quality of Family Relationships: supportive Do you feel physically threatened by others?: No      10/01/2023    3:31 PM  Depression screen PHQ 2/9  Decreased Interest 1  Down, Depressed, Hopeless 0  PHQ - 2 Score 1    There were no  vitals filed  for this visit.  Medications Reviewed Today     Reviewed by Valaria Garland, RN (Registered Nurse) on 10/01/23 at 1508  Med List Status: <None>   Medication Order Taking? Sig Documenting Provider Last Dose Status Informant  acetaminophen  (TYLENOL ) 500 MG tablet [id number]  Take 1,000 mg by mouth every 6 (six) hours as needed for mild pain or headache. [provider]  Active   clopidogrel  (PLAVIX ) 75 MG tablet [id number]  Take 1 tablet (75 mg total) by mouth daily.  Patient not taking: Reported on 09/08/2023   Jodeane Mulligan, DO  Active   furosemide (LASIX) 40 MG tablet [id number]  Take 20-40 mg by mouth See admin instructions. Take 40 mg by mouth once a day and an additional 20 mg as needed for leg swelling [provider]  Active Family Member           Med Note (BRIDGES, JACQUELINE L   Mon Sep 08, 2023  2:54 PM)    lamoTRIgine  (LAMICTAL ) 200 MG tablet [id number]  Take 200 mg by mouth at bedtime. [provider]  Active Family Member           Med Note Collene Dawson, JACQUELINE L   Mon Sep 08, 2023  2:54 PM)    memantine  (NAMENDA ) 10 MG tablet [id number]  TAKE 1 TABLET BY MOUTH 2 TIMES A DAY Patel, Donika K, DO  Active   oxyCODONE -acetaminophen  (PERCOCET) 10-325 MG tablet [id number]  Take 1 tablet by mouth every 12 (twelve) hours as needed for pain. [provider]  Active Family Member           Med Note Collene Dawson, JACQUELINE L   Mon Sep 08, 2023  2:54 PM)    pregabalin  (LYRICA ) 100 MG capsule [id number]  TAKE 1 CAPSULE BY MOUTH 2 TIMES A DAY Patel, Donika K, DO  Active            Med Note Collene Dawson, JACQUELINE L   Mon Sep 08, 2023  2:54 PM)    primidone  (MYSOLINE ) 50 MG tablet [id number]  TAKE 2 TABLETS BY MOUTH TWICE A DAY Patel, Donika K, DO  Active   propranolol  ER (INDERAL  LA) 120 MG 24 hr capsule [id number]  TAKE 1 CAPSULE BY MOUTH 2 TIMES A DAY OK TO TAKE EXTRA CAPSULE AS NEEDED FOR WORSENING TREMOR Patel, Donika K, DO  Active    spironolactone  (ALDACTONE ) 25 MG tablet [id number]  Take 0.5 tablets (12.5 mg total) by mouth daily. Jann Melody, MD  Active   zolpidem  (AMBIEN  CR) 12.5 MG CR tablet [id number]  Take 12.5 mg by mouth daily at 6 (six) AM. [provider]  Active Family Member           Med Note Collene Dawson, JACQUELINE L   Mon Sep 08, 2023  2:55 PM)              Recommendation:   PCP Follow-up Home Health requests: Patient reports he is established with HH. There is a gap in visits due to him visiting [place]  and recently returning home. Plans to resume HH now that he is home. Patient has been advised to quit drinking due to frequent falls  Follow Up Plan:   Telephone follow up appointment date/time:  10/15/23 RNCM will continue to follow patient by regular telephone calls until he has moved to [place]  and transferred to PCP there  Theodora Fish, RN MSN [facility] RN Care Manager Direct Dial: [phone]  Fax: [phone]

## 2023-10-01 NOTE — Patient Instructions (Addendum)
 Visit Information  Thank you for taking time to visit with me today. Please don't hesitate to contact me if I can be of assistance to you before our next scheduled appointment.  Our next appointment is by telephone on 10/15/23 at 2 PM Please call the care guide team at [phone] if you need to cancel or reschedule your appointment.   Following is a copy of your care plan:   Goals Addressed             This Visit's Progress    [facility] RN Care Plan   On track    Problems:  Chronic Disease Management support and education needs related to CHF and frequent falls Cognitive Deficits Alcohol use  Goal: Over the next 30 days the Patient will attend all scheduled medical appointments: ECHO 10/09/23, PCP 10/16/23 as evidenced by completed results uploaded to EMR        take all medications exactly as prescribed and will call provider for medication related questions as evidenced by medication adherence    verbalize basic understanding of CHF disease process and self health management plan as evidenced by verbalizing why daily weights are recommended and patient report of monitoring for new or increased swelling daily Report decreased frequency of falls and increased strength/balance through working with PT  Interventions:   Heart Failure Interventions: Provided education on low sodium diet Assessed need for readable accurate scales in home Discussed importance of daily weight and advised patient to weigh and record daily Reviewed role of diuretics in prevention of fluid overload and management of heart failure; Discussed the importance of keeping all appointments with provider  Falls Interventions: Reviewed medications and discussed potential side effects of medications such as dizziness and frequent urination Advised patient of importance of notifying provider of falls Assessed for signs and symptoms of orthostatic hypotension Assessed working status of life alert bracelet and patient  adherence Discussed the role that alcohol can play in causing falls and importance of abstaining from alcohol  Patient Self-Care Activities:  Attend all scheduled provider appointments Call provider office for new concerns or questions  Perform all self care activities independently  Take medications as prescribed   use salt in moderation watch for swelling in feet, ankles and legs every day weigh myself daily Make the decision to quit drinking completely  Plan:  Telephone follow up appointment with care management team member scheduled for:  10/15/23 at 2 PM Visit notes and care plan to be faxed to patient's PCP with patient consent             Please call the Suicide and Crisis Lifeline: 988 call 1-800-273-TALK (toll free, 24 hour hotline) if you are experiencing a Mental Health or Behavioral Health Crisis or need someone to talk to.  Patient verbalizes understanding of instructions and care plan provided today and agrees to view in MyChart. Active MyChart status and patient understanding of how to access instructions and care plan via MyChart confirmed with patient.     Theodora Fish, RN MSN [place]  [facility] Population Health RN Care Manager Direct Dial: [phone]  Fax: [phone]  Fall Prevention in the Home, Adult Falls can cause injuries and affect people of all ages. There are many simple things that you can do to make your home safe and to help prevent falls. If you need it, ask for help making these changes. What actions can I take to prevent falls? General information Use good lighting in all rooms. Make sure to: Replace  any light bulbs that burn out. Turn on lights if it is dark and use night-lights. Keep items that you use often in easy-to-reach places. Lower the shelves around your home if needed. Move furniture so that there are clear paths around it. Do not keep throw rugs or other things on the floor that can make you trip. If any of your  floors are uneven, fix them. Add color or contrast paint or tape to clearly mark and help you see: Grab bars or handrails. First and last steps of staircases. Where the edge of each step is. If you use a ladder or stepladder: Make sure that it is fully opened. Do not climb a closed ladder. Make sure the sides of the ladder are locked in place. Have someone hold the ladder while you use it. Know where your pets are as you move through your home. What can I do in the bathroom?     Keep the floor dry. Clean up any water that is on the floor right away. Remove soap buildup in the bathtub or shower. Buildup makes bathtubs and showers slippery. Use non-skid mats or decals on the floor of the bathtub or shower. Attach bath mats securely with double-sided, non-slip rug tape. If you need to sit down while you are in the shower, use a non-slip stool. Install grab bars by the toilet and in the bathtub and shower. Do not use towel bars as grab bars. What can I do in the bedroom? Make sure that you have a light by your bed that is easy to reach. Do not use any sheets or blankets on your bed that hang to the floor. Have a firm bench or chair with side arms that you can use for support when you get dressed. What can I do in the kitchen? Clean up any spills right away. If you need to reach something above you, use a sturdy step stool that has a grab bar. Keep electrical cables out of the way. Do not use floor polish or wax that makes floors slippery. What can I do with my stairs? Do not leave anything on the stairs. Make sure that you have a light switch at the top and the bottom of the stairs. Have them installed if you do not have them. Make sure that there are handrails on both sides of the stairs. Fix handrails that are broken or loose. Make sure that handrails are as long as the staircases. Install non-slip stair treads on all stairs in your home if they do not have carpet. Avoid having throw  rugs at the top or bottom of stairs, or secure the rugs with carpet tape to prevent them from moving. Choose a carpet design that does not hide the edge of steps on the stairs. Make sure that carpet is firmly attached to the stairs. Fix any carpet that is loose or worn. What can I do on the outside of my home? Use bright outdoor lighting. Repair the edges of walkways and driveways and fix any cracks. Clear paths of anything that can make you trip, such as tools or rocks. Add color or contrast paint or tape to clearly mark and help you see high doorway thresholds. Trim any bushes or trees on the main path into your home. Check that handrails are securely fastened and in good repair. Both sides of all steps should have handrails. Install guardrails along the edges of any raised decks or porches. Have leaves, snow, and ice cleared regularly.  Use sand, salt, or ice melt on walkways during winter months if you live where there is ice and snow. In the garage, clean up any spills right away, including grease or oil spills. What other actions can I take? Review your medicines with your health care provider. Some medicines can make you confused or feel dizzy. This can increase your chance of falling. Wear closed-toe shoes that fit well and support your feet. Wear shoes that have rubber soles and low heels. Use a cane, walker, scooter, or crutches that help you move around if needed. Talk with your provider about other ways that you can decrease your risk of falls. This may include seeing a physical therapist to learn to do exercises to improve movement and strength. Where to find more information Centers for Disease Control and Prevention, STEADI: TonerPromos.no [employer] on Aging: BaseRingTones.pl National Institute on Aging: BaseRingTones.pl Contact a health care provider if: You are afraid of falling at home. You feel weak, drowsy, or dizzy at home. You fall at home. Get help right away if you: Lose  consciousness or have trouble moving after a fall. Have a fall that causes a head injury. These symptoms may be an emergency. Get help right away. Call 911. Do not wait to see if the symptoms will go away. Do not drive yourself to the hospital. This information is not intended to replace advice given to you by your health care provider. Make sure you discuss any questions you have with your health care provider. Document Revised: 01/07/2022 Document Reviewed: 01/07/2022 Elsevier Patient Education  2024 [employer].

## 2023-10-02 DIAGNOSIS — F02A3 Dementia in other diseases classified elsewhere, mild, with mood disturbance: Secondary | ICD-10-CM | POA: Diagnosis not present

## 2023-10-02 DIAGNOSIS — G500 Trigeminal neuralgia: Secondary | ICD-10-CM | POA: Diagnosis not present

## 2023-10-02 DIAGNOSIS — F02A4 Dementia in other diseases classified elsewhere, mild, with anxiety: Secondary | ICD-10-CM | POA: Diagnosis not present

## 2023-10-02 DIAGNOSIS — I10 Essential (primary) hypertension: Secondary | ICD-10-CM | POA: Diagnosis not present

## 2023-10-02 DIAGNOSIS — J4489 Other specified chronic obstructive pulmonary disease: Secondary | ICD-10-CM | POA: Diagnosis not present

## 2023-10-02 DIAGNOSIS — G309 Alzheimer's disease, unspecified: Secondary | ICD-10-CM | POA: Diagnosis not present

## 2023-10-02 DIAGNOSIS — F3170 Bipolar disorder, currently in remission, most recent episode unspecified: Secondary | ICD-10-CM | POA: Diagnosis not present

## 2023-10-02 DIAGNOSIS — F419 Anxiety disorder, unspecified: Secondary | ICD-10-CM | POA: Diagnosis not present

## 2023-10-02 DIAGNOSIS — L89892 Pressure ulcer of other site, stage 2: Secondary | ICD-10-CM | POA: Diagnosis not present

## 2023-10-06 DIAGNOSIS — L89892 Pressure ulcer of other site, stage 2: Secondary | ICD-10-CM | POA: Diagnosis not present

## 2023-10-06 DIAGNOSIS — G500 Trigeminal neuralgia: Secondary | ICD-10-CM | POA: Diagnosis not present

## 2023-10-06 DIAGNOSIS — J4489 Other specified chronic obstructive pulmonary disease: Secondary | ICD-10-CM | POA: Diagnosis not present

## 2023-10-06 DIAGNOSIS — I10 Essential (primary) hypertension: Secondary | ICD-10-CM | POA: Diagnosis not present

## 2023-10-06 DIAGNOSIS — F02A4 Dementia in other diseases classified elsewhere, mild, with anxiety: Secondary | ICD-10-CM | POA: Diagnosis not present

## 2023-10-06 DIAGNOSIS — F419 Anxiety disorder, unspecified: Secondary | ICD-10-CM | POA: Diagnosis not present

## 2023-10-06 DIAGNOSIS — F3170 Bipolar disorder, currently in remission, most recent episode unspecified: Secondary | ICD-10-CM | POA: Diagnosis not present

## 2023-10-06 DIAGNOSIS — F02A3 Dementia in other diseases classified elsewhere, mild, with mood disturbance: Secondary | ICD-10-CM | POA: Diagnosis not present

## 2023-10-06 DIAGNOSIS — G309 Alzheimer's disease, unspecified: Secondary | ICD-10-CM | POA: Diagnosis not present

## 2023-10-09 ENCOUNTER — Ambulatory Visit (HOSPITAL_COMMUNITY)
Admission: RE | Admit: 2023-10-09 | Discharge: 2023-10-09 | Disposition: A | Discharge status: Home / Self Care | Source: Ambulatory Visit | Attending: Cardiovascular Disease | Admitting: Cardiovascular Disease

## 2023-10-09 DIAGNOSIS — I513 Intracardiac thrombosis, not elsewhere classified: Secondary | ICD-10-CM

## 2023-10-10 ENCOUNTER — Ambulatory Visit: Payer: Self-pay | Admitting: Internal Medicine

## 2023-10-10 LAB — ECHOCARDIOGRAM LIMITED
Area-P 1/2: 3.27 cm2
P 1/2 time: 542 ms
S' Lateral: 2.5 cm

## 2023-10-14 DIAGNOSIS — F419 Anxiety disorder, unspecified: Secondary | ICD-10-CM | POA: Diagnosis not present

## 2023-10-14 DIAGNOSIS — J4489 Other specified chronic obstructive pulmonary disease: Secondary | ICD-10-CM | POA: Diagnosis not present

## 2023-10-14 DIAGNOSIS — F02A3 Dementia in other diseases classified elsewhere, mild, with mood disturbance: Secondary | ICD-10-CM | POA: Diagnosis not present

## 2023-10-14 DIAGNOSIS — F3170 Bipolar disorder, currently in remission, most recent episode unspecified: Secondary | ICD-10-CM | POA: Diagnosis not present

## 2023-10-14 DIAGNOSIS — G500 Trigeminal neuralgia: Secondary | ICD-10-CM | POA: Diagnosis not present

## 2023-10-14 DIAGNOSIS — F02A4 Dementia in other diseases classified elsewhere, mild, with anxiety: Secondary | ICD-10-CM | POA: Diagnosis not present

## 2023-10-14 DIAGNOSIS — G309 Alzheimer's disease, unspecified: Secondary | ICD-10-CM | POA: Diagnosis not present

## 2023-10-14 DIAGNOSIS — I10 Essential (primary) hypertension: Secondary | ICD-10-CM | POA: Diagnosis not present

## 2023-10-14 DIAGNOSIS — L89892 Pressure ulcer of other site, stage 2: Secondary | ICD-10-CM | POA: Diagnosis not present

## 2023-10-15 ENCOUNTER — Other Ambulatory Visit: Payer: Self-pay

## 2023-10-15 NOTE — Patient Outreach (Signed)
 Complex Care Management   Visit Note  10/15/2023  Name:  Gabriel Richardson MRN: [id number] DOB: 1945-09-16  Situation: Referral received for Complex Care Management related to frequent falls I obtained verbal consent from Patient.  Visit completed with Page Boast  on the phone  Background:   Past Medical History:  Diagnosis Date   Ankle fracture    right with syndesmotic disruption   Anxiety    Arthritis    Asthma    Bipolar disorder (HCC)    Cervical stenosis of spinal canal    Closed fracture of right distal fibula 01/07/2019   COPD (chronic obstructive pulmonary disease) (HCC)    Depression    Emphysema of lung (HCC)    Neuromuscular disorder (HCC)    Trigeminal neuralgia    Wears glasses     Assessment: Patient Reported Symptoms:  Cognitive Cognitive Status: Able to follow simple commands, Struggling with memory recall (Continues to report ongoing issues with memory recall. PCP is aware.) Cognitive/Intellectual Conditions Management [RPT]: None reported or documented in medical history or problem list   Health Maintenance Behaviors: Annual physical exam  Neurological Neurological Review of Symptoms: Not assessed    HEENT HEENT Symptoms Reported: Not assessed      Cardiovascular Cardiovascular Symptoms Reported: Swelling in legs or feet (Patient reports swelling bilateral feet L>R, improved since previous CMRN assessment) Does patient have uncontrolled Hypertension?: No Cardiovascular Conditions: Hypertension, Heart failure Cardiovascular Management Strategies: Medication therapy, Adequate rest, Diet modification Cardiovascular Comment: Note per chart review ECHO was performed and WNL.  Respiratory Respiratory Symptoms Reported: Not assesed    Endocrine Patient reports the following symptoms related to hypoglycemia or hyperglycemia : Not assessed    Gastrointestinal Gastrointestinal Symptoms Reported: Not assessed      Genitourinary Genitourinary Symptoms Reported:  Not assessed    Integumentary Integumentary Symptoms Reported: Not assessed    Musculoskeletal Musculoskelatal Symptoms Reviewed: Weakness Musculoskeletal Conditions: Other Other Musculoskeletal Conditions: Tremor Musculoskeletal Management Strategies: Medical device, Adequate rest, Exercise (Working with [facility]) Musculoskeletal Comment: Patient reports that he had a fall last night and cut his face on a piece of the furniture. At the time of fall patient had consumed about half of a Manhattan (whiskey). He notes that he has an appointment with PCP tomorrow and plans to discuss/have cut checked out. Falls in the past year?: Yes Number of falls in past year: 2 or more Was there an injury with Fall?: Yes Fall Risk Category Calculator: 3 Patient Fall Risk Level: High Fall Risk Patient at Risk for Falls Due to: History of fall(s), Impaired balance/gait, Impaired vision, Medication side effect, Other (Comment) (Daily alcohol consumption) Fall risk Follow up: Falls evaluation completed, Education provided, Falls prevention discussed  Psychosocial Psychosocial Symptoms Reported: Substance use Additional Psychological Details: Alcohol screening complete - score of 12. Patient counseled on alcohol cessation, refuses teaching at this time. Behavioral Health Conditions: Depression, Substance use Substance Use: Alcohol (1-1.5 Manhattans a day) Behavioral Management Strategies: Support system          10/01/2023    3:31 PM  Depression screen PHQ 2/9  Decreased Interest 1  Down, Depressed, Hopeless 0  PHQ - 2 Score 1    There were no vitals filed for this visit.  Medications Reviewed Today     Reviewed by Valaria Garland, RN (Registered Nurse) on 10/15/23 at 1409  Med List Status: <None>   Medication Order Taking? Sig Documenting Provider Last Dose Status Informant  acetaminophen  (TYLENOL ) 500 MG tablet  [id number] No Take 1,000 mg by mouth every 6 (six) hours as needed for mild pain or  headache. [provider] Taking Active   clopidogrel  (PLAVIX ) 75 MG tablet [id number] No Take 1 tablet (75 mg total) by mouth daily. Jodeane Mulligan, DO Taking Active   furosemide (LASIX) 40 MG tablet [id number] No Take 20-40 mg by mouth See admin instructions. Take 40 mg by mouth once a day and an additional 20 mg as needed for leg swelling [provider] Taking Active Family Member           Med Note (BRIDGES, JACQUELINE L   Mon Sep 08, 2023  2:54 PM)    lamoTRIgine  (LAMICTAL ) 200 MG tablet [id number] No Take 200 mg by mouth at bedtime. [provider] Taking Active Family Member           Med Note Collene Dawson, JACQUELINE L   Mon Sep 08, 2023  2:54 PM)    memantine  (NAMENDA ) 10 MG tablet [id number] No TAKE 1 TABLET BY MOUTH 2 TIMES A DAY Patel, Donika K, DO Taking Active   oxyCODONE -acetaminophen  (PERCOCET) 10-325 MG tablet [id number] No Take 1 tablet by mouth every 12 (twelve) hours as needed for pain. [provider] Taking Active Family Member           Med Note Collene Dawson, JACQUELINE L   Mon Sep 08, 2023  2:54 PM)    pregabalin  (LYRICA ) 100 MG capsule [id number] No TAKE 1 CAPSULE BY MOUTH 2 TIMES A DAY Patel, Donika K, DO Taking Active            Med Note Collene Dawson, JACQUELINE L   Mon Sep 08, 2023  2:54 PM)    primidone  (MYSOLINE ) 50 MG tablet [id number] No TAKE 2 TABLETS BY MOUTH TWICE A DAY Patel, Donika K, DO Taking Active   propranolol  ER (INDERAL  LA) 120 MG 24 hr capsule [id number] No TAKE 1 CAPSULE BY MOUTH 2 TIMES A DAY OK TO TAKE EXTRA CAPSULE AS NEEDED FOR WORSENING TREMOR Patel, Donika K, DO Taking Active   spironolactone  (ALDACTONE ) 25 MG tablet [id number] No Take 0.5 tablets (12.5 mg total) by mouth daily. Jann Melody, MD Taking Active   zolpidem  (AMBIEN  CR) 12.5 MG CR tablet [id number] No Take 12.5 mg by mouth daily at 6 (six) AM. [provider] Taking Active Family Member           Med Note Collene Dawson, JACQUELINE L   Mon Sep 08, 2023   2:55 PM)              Recommendation:   PCP Follow-up Continue Current Plan of Care  Follow Up Plan:   Telephone follow up appointment date/time:  10/29/23 at 2:15 PM  Theodora Fish, RN MSN [place]  [facility] Population Health RN Care Manager Direct Dial: [phone]  Fax: [phone]

## 2023-10-15 NOTE — Patient Instructions (Signed)
 Visit Information  Thank you for taking time to visit with me today. Please don't hesitate to contact me if I can be of assistance to you before our next scheduled appointment.  Your next care management appointment is by telephone on 10/29/23 at 2:15 PM  Please call the care guide team at [phone] if you need to cancel, schedule, or reschedule an appointment.   Please call the Suicide and Crisis Lifeline: 988 call 1-800-273-TALK (toll free, 24 hour hotline) if you are experiencing a Mental Health or Behavioral Health Crisis or need someone to talk to.  Theodora Fish, RN MSN [place]  [facility] Population Health RN Care Manager Direct Dial: [phone]  Fax: [phone]

## 2023-10-16 DIAGNOSIS — G250 Essential tremor: Secondary | ICD-10-CM | POA: Diagnosis not present

## 2023-10-16 DIAGNOSIS — M4807 Spinal stenosis, lumbosacral region: Secondary | ICD-10-CM | POA: Diagnosis not present

## 2023-10-16 DIAGNOSIS — S12001G Unspecified nondisplaced fracture of first cervical vertebra, subsequent encounter for fracture with delayed healing: Secondary | ICD-10-CM | POA: Diagnosis not present

## 2023-10-16 DIAGNOSIS — I236 Thrombosis of atrium, auricular appendage, and ventricle as current complications following acute myocardial infarction: Secondary | ICD-10-CM | POA: Diagnosis not present

## 2023-10-16 DIAGNOSIS — M542 Cervicalgia: Secondary | ICD-10-CM | POA: Diagnosis not present

## 2023-10-16 DIAGNOSIS — I252 Old myocardial infarction: Secondary | ICD-10-CM | POA: Diagnosis not present

## 2023-10-16 DIAGNOSIS — G8929 Other chronic pain: Secondary | ICD-10-CM | POA: Diagnosis not present

## 2023-10-16 DIAGNOSIS — Z1330 Encounter for screening examination for mental health and behavioral disorders, unspecified: Secondary | ICD-10-CM | POA: Diagnosis not present

## 2023-10-16 DIAGNOSIS — Z79891 Long term (current) use of opiate analgesic: Secondary | ICD-10-CM | POA: Diagnosis not present

## 2023-10-16 DIAGNOSIS — F3170 Bipolar disorder, currently in remission, most recent episode unspecified: Secondary | ICD-10-CM | POA: Diagnosis not present

## 2023-10-16 DIAGNOSIS — I5020 Unspecified systolic (congestive) heart failure: Secondary | ICD-10-CM | POA: Diagnosis not present

## 2023-10-20 DIAGNOSIS — F319 Bipolar disorder, unspecified: Secondary | ICD-10-CM | POA: Diagnosis not present

## 2023-10-21 DIAGNOSIS — J4489 Other specified chronic obstructive pulmonary disease: Secondary | ICD-10-CM | POA: Diagnosis not present

## 2023-10-21 DIAGNOSIS — G8929 Other chronic pain: Secondary | ICD-10-CM | POA: Diagnosis not present

## 2023-10-21 DIAGNOSIS — F3170 Bipolar disorder, currently in remission, most recent episode unspecified: Secondary | ICD-10-CM | POA: Diagnosis not present

## 2023-10-21 DIAGNOSIS — G309 Alzheimer's disease, unspecified: Secondary | ICD-10-CM | POA: Diagnosis not present

## 2023-10-21 DIAGNOSIS — F02A3 Dementia in other diseases classified elsewhere, mild, with mood disturbance: Secondary | ICD-10-CM | POA: Diagnosis not present

## 2023-10-21 DIAGNOSIS — M542 Cervicalgia: Secondary | ICD-10-CM | POA: Diagnosis not present

## 2023-10-21 DIAGNOSIS — I10 Essential (primary) hypertension: Secondary | ICD-10-CM | POA: Diagnosis not present

## 2023-10-21 DIAGNOSIS — F02A4 Dementia in other diseases classified elsewhere, mild, with anxiety: Secondary | ICD-10-CM | POA: Diagnosis not present

## 2023-10-21 DIAGNOSIS — G500 Trigeminal neuralgia: Secondary | ICD-10-CM | POA: Diagnosis not present

## 2023-10-23 ENCOUNTER — Emergency Department (HOSPITAL_BASED_OUTPATIENT_CLINIC_OR_DEPARTMENT_OTHER)

## 2023-10-23 ENCOUNTER — Emergency Department (HOSPITAL_BASED_OUTPATIENT_CLINIC_OR_DEPARTMENT_OTHER)
Admission: EM | Admit: 2023-10-23 | Discharge: 2023-10-23 | Discharge status: Against Medical Advice | Attending: Emergency Medicine | Admitting: Emergency Medicine

## 2023-10-23 ENCOUNTER — Encounter (HOSPITAL_BASED_OUTPATIENT_CLINIC_OR_DEPARTMENT_OTHER): Payer: Self-pay | Admitting: Emergency Medicine

## 2023-10-23 ENCOUNTER — Other Ambulatory Visit: Payer: Self-pay

## 2023-10-23 DIAGNOSIS — R4182 Altered mental status, unspecified: Secondary | ICD-10-CM | POA: Diagnosis not present

## 2023-10-23 DIAGNOSIS — J449 Chronic obstructive pulmonary disease, unspecified: Secondary | ICD-10-CM | POA: Diagnosis not present

## 2023-10-23 DIAGNOSIS — S0012XA Contusion of left eyelid and periocular area, initial encounter: Secondary | ICD-10-CM | POA: Insufficient documentation

## 2023-10-23 DIAGNOSIS — R519 Headache, unspecified: Secondary | ICD-10-CM | POA: Diagnosis not present

## 2023-10-23 DIAGNOSIS — S0512XA Contusion of eyeball and orbital tissues, left eye, initial encounter: Secondary | ICD-10-CM | POA: Diagnosis not present

## 2023-10-23 DIAGNOSIS — S12030A Displaced posterior arch fracture of first cervical vertebra, initial encounter for closed fracture: Secondary | ICD-10-CM | POA: Diagnosis not present

## 2023-10-23 DIAGNOSIS — R220 Localized swelling, mass and lump, head: Secondary | ICD-10-CM | POA: Diagnosis not present

## 2023-10-23 DIAGNOSIS — I6501 Occlusion and stenosis of right vertebral artery: Secondary | ICD-10-CM | POA: Diagnosis not present

## 2023-10-23 DIAGNOSIS — F0390 Unspecified dementia without behavioral disturbance: Secondary | ICD-10-CM

## 2023-10-23 DIAGNOSIS — S12100A Unspecified displaced fracture of second cervical vertebra, initial encounter for closed fracture: Secondary | ICD-10-CM | POA: Diagnosis not present

## 2023-10-23 DIAGNOSIS — I6521 Occlusion and stenosis of right carotid artery: Secondary | ICD-10-CM | POA: Diagnosis not present

## 2023-10-23 DIAGNOSIS — S12600A Unspecified displaced fracture of seventh cervical vertebra, initial encounter for closed fracture: Secondary | ICD-10-CM | POA: Insufficient documentation

## 2023-10-23 DIAGNOSIS — S199XXA Unspecified injury of neck, initial encounter: Secondary | ICD-10-CM | POA: Diagnosis present

## 2023-10-23 DIAGNOSIS — R296 Repeated falls: Secondary | ICD-10-CM

## 2023-10-23 DIAGNOSIS — S069X1A Unspecified intracranial injury with loss of consciousness of 30 minutes or less, initial encounter: Secondary | ICD-10-CM | POA: Diagnosis not present

## 2023-10-23 DIAGNOSIS — S0191XA Laceration without foreign body of unspecified part of head, initial encounter: Secondary | ICD-10-CM | POA: Diagnosis not present

## 2023-10-23 DIAGNOSIS — W19XXXA Unspecified fall, initial encounter: Secondary | ICD-10-CM | POA: Diagnosis not present

## 2023-10-23 LAB — CBC WITH DIFFERENTIAL/PLATELET
Abs Immature Granulocytes: 0.01 10*3/uL (ref 0.00–0.07)
Basophils Absolute: 0.1 10*3/uL (ref 0.0–0.1)
Basophils Relative: 1 %
Eosinophils Absolute: 0.2 10*3/uL (ref 0.0–0.5)
Eosinophils Relative: 3 %
HCT: 31 % — ABNORMAL LOW (ref 39.0–52.0)
Hemoglobin: 10.5 g/dL — ABNORMAL LOW (ref 13.0–17.0)
Immature Granulocytes: 0 %
Lymphocytes Relative: 28 %
Lymphs Abs: 1.9 10*3/uL (ref 0.7–4.0)
MCH: 34.5 pg — ABNORMAL HIGH (ref 26.0–34.0)
MCHC: 33.9 g/dL (ref 30.0–36.0)
MCV: 102 fL — ABNORMAL HIGH (ref 80.0–100.0)
Monocytes Absolute: 0.7 10*3/uL (ref 0.1–1.0)
Monocytes Relative: 11 %
Neutro Abs: 3.7 10*3/uL (ref 1.7–7.7)
Neutrophils Relative %: 57 %
Platelets: 151 10*3/uL (ref 150–400)
RBC: 3.04 MIL/uL — ABNORMAL LOW (ref 4.22–5.81)
RDW: 14.1 % (ref 11.5–15.5)
WBC: 6.6 10*3/uL (ref 4.0–10.5)
nRBC: 0 % (ref 0.0–0.2)

## 2023-10-23 LAB — BASIC METABOLIC PANEL WITH GFR
Anion gap: 13 (ref 5–15)
BUN: 20 mg/dL (ref 8–23)
CO2: 29 mmol/L (ref 22–32)
Calcium: 9.1 mg/dL (ref 8.9–10.3)
Chloride: 93 mmol/L — ABNORMAL LOW (ref 98–111)
Creatinine, Ser: 1.18 mg/dL (ref 0.61–1.24)
GFR, Estimated: >60 mL/min (ref >60)
Glucose, Bld: 121 mg/dL — ABNORMAL HIGH (ref 70–99)
Potassium: 3.8 mmol/L (ref 3.5–5.1)
Sodium: 134 mmol/L — ABNORMAL LOW (ref 135–145)

## 2023-10-23 LAB — CK: Total CK: 267 U/L (ref 49–397)

## 2023-10-23 MED ORDER — SODIUM CHLORIDE 0.9 % IV BOLUS
1000.0000 mL | Freq: Once | INTRAVENOUS | Status: AC
  Administered 2023-10-23: 1000 mL via INTRAVENOUS

## 2023-10-23 MED ORDER — IOHEXOL 350 MG/ML SOLN
100.0000 mL | Freq: Once | INTRAVENOUS | Status: AC | PRN
  Administered 2023-10-23: 75 mL via INTRAVENOUS

## 2023-10-23 NOTE — ED Notes (Signed)
 PT also observed outside of C-collar when being brought back into the room. RN and PA aware

## 2023-10-23 NOTE — ED Triage Notes (Signed)
 Multiple falls  Bruising to left eye, lac and bruising to back of head.  On plavix ,  Headache  Out of home pain meds asking for 60 day supply

## 2023-10-23 NOTE — ED Notes (Signed)
Called lab to add on CK

## 2023-10-23 NOTE — Discharge Instructions (Signed)
 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  Again I am not sure how long they have been there.  I have given you follow-up with neurosurgery and neurology.  I know you are moving to [place]  so you can establish care there.  You are more than welcome to return to the emergency department at any given time for any worsening symptoms or concerns.

## 2023-10-23 NOTE — ED Notes (Signed)
 Patient transported to CT

## 2023-10-23 NOTE — Progress Notes (Signed)
 Patient ID: Gabriel Richardson, male   DOB: 1946/03/17, 78 y.o.   MRN: [id number] BP 111/63   Pulse 73   Temp 97.7 F (36.5 C) (Oral)   Resp 18   SpO2 98%  CT cspine reviewed. No operative indications. Anterior osteophyte fracture. He is anticoagulated and no operative treatment possible anyway. May remain in collar, does not need when lying down, or in bed. Should follow up as outpatient with Dr. Arvilla Birmingham in our office.

## 2023-10-23 NOTE — Progress Notes (Signed)
 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  Per ED PA he has equal strength and sensation in the upper and lower extremities.   Recommendations:  - Of note the patient very recently had an echocardiogram, so unlikely to have need to repeat that unless there are concerning MRI brain findings for multiple strokes in an embolic pattern or other acute medical concerns for which an echocardiogram would be indicated.   - Recommend CTA head and neck while he is still at [facility] to confirm no critical vessel stenosis, and no dissection, given concern for C-spine fracture as well.   - Agree MRI brain would be helpful to clarify acuity of stroke. If MRI brain is positive for acute/subacute stroke please reach out to neurology for further recommendations; not clear to me that this is definitely an acute stroke needing inpatient workup based on the information I have at this time.  Certainly if he needs admission for other reasons neurology can be formally consulted if MRI brain is positive  10/09/2023  1. Definity contrast not used. Apical wall motion has recovered, no  thrombus is seen. Left ventricular ejection fraction, by estimation, is 65  to 70%. The  left ventricle has normal function. The left ventricle has no  regional wall motion abnormalities.  There is mild left ventricular hypertrophy. Left ventricular diastolic  parameters are consistent with Grade I diastolic dysfunction (impaired  relaxation).   2. Right ventricular systolic function is normal. The right ventricular  size is mildly enlarged. There is normal pulmonary artery systolic  pressure. The estimated right ventricular systolic pressure is 31.7 mmHg.   3. The mitral valve is grossly normal. Mild mitral valve regurgitation.  No evidence of mitral stenosis.   4. The aortic valve is grossly normal. Aortic valve regurgitation is  trivial. No aortic stenosis is present.   5. The inferior vena cava is normal in size with greater than 50%  respiratory variability, suggesting right atrial pressure of 3 mmHg.   CT head 10/23/2023, personally reviewed, agree with report below 1. No acute intracranial hemorrhage. 2. 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 Consider a brain MRI for further evaluation. 3. Background parenchymal atrophy and chronic small vessel ischemic disease. 4. Posterior scalp hematoma. 5. Minor right maxillary sinus mucosal thickening.   CT cervical spine 10/23/2023: 1. Minimally displaced fracture of a C7-T1 ventral osteophyte (at its junction with the T1 vertebral body). This is new from the prior cervical spine CT of 07/27/2023 and likely acute. 2. Minimal displacement now present at site of known unhealed C1 posterior arch fractures. 3. Unchanged 4 mm distraction and mild retrolisthesis at site of a known chronic unhealed type  2 dens fracture. 4. Mild C7-T1 grade 1 anterolisthesis, unchanged. 5. Spondylosis and postoperative changes at the cervical and visible upper thoracic level (with multilevel vertebral ankylosis), as described  within the body of the report. 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

## 2023-10-23 NOTE — ED Notes (Signed)
 [facility] called per RN/ patient leaving AMA

## 2023-10-23 NOTE — ED Notes (Addendum)
 Pt leaving AMA and signed AMA form. Encouraged to return to ED if needed. PA aware and at bedside. NAD.

## 2023-10-23 NOTE — ED Notes (Signed)
 ED Provider at bedside.

## 2023-10-23 NOTE — ED Notes (Addendum)
 Cleaned small laceration on back of head with sterile water. Bleeding controlled at this time. No open wounds noted. Pt tolerated well.

## 2023-10-23 NOTE — ED Notes (Signed)
 Pt left AMA with friend before receiving discharge paperwork.

## 2023-10-23 NOTE — ED Notes (Signed)
 PT family member ambulated pt to restroom. This tech observed the family member outside the restroom when sitting back down to chart. RN informed PA informed

## 2023-10-23 NOTE — ED Provider Notes (Signed)
 [place] EMERGENCY DEPARTMENT AT [facility] Provider Note   CSN: [id number] Arrival date & time: 10/23/23  1259     History Chief Complaint  Patient presents with   Gabriel Richardson is a 78 y.o. male patient with history of COPD who presents to the emergency department today after a fall that occurred sometime yesterday.  Patient does not recall the fall or what happened.  Somebody last saw him around 7 PM.  His friend who was at the bedside saw him this morning on the floor.  There was quite a bit of blood for.  I took him to urgent care checked him out and sent him here as the patient is anticoagulated.  The friend at bedside states that he does drink bourbon daily and takes chronic pain medication.  Patient currently has no complaints.  Of note, patient has been having frequent falls for quite some time.   Fall       Home Medications Prior to Admission medications   Medication Sig Start Date End Date Taking? Authorizing Provider  acetaminophen  (TYLENOL ) 500 MG tablet Take 1,000 mg by mouth every 6 (six) hours as needed for mild pain or headache.    [provider]  clopidogrel  (PLAVIX ) 75 MG tablet Take 1 tablet (75 mg total) by mouth daily. 07/30/23   Jodeane Mulligan, DO  furosemide (LASIX) 40 MG tablet Take 20-40 mg by mouth See admin instructions. Take 40 mg by mouth once a day and an additional 20 mg as needed for leg swelling 06/02/23   [provider]  lamoTRIgine  (LAMICTAL ) 200 MG tablet Take 200 mg by mouth at bedtime. 01/18/15   [provider]  memantine  (NAMENDA ) 10 MG tablet TAKE 1 TABLET BY MOUTH 2 TIMES A DAY 08/25/23   Patel, Donika K, DO  oxyCODONE -acetaminophen  (PERCOCET) 10-325 MG tablet Take 1 tablet by mouth every 12 (twelve) hours as needed for pain. 12/08/22   [provider]  pregabalin  (LYRICA ) 100 MG capsule TAKE 1 CAPSULE BY MOUTH 2 TIMES A DAY 07/09/23   Patel, Donika K, DO  primidone  (MYSOLINE ) 50 MG tablet  TAKE 2 TABLETS BY MOUTH TWICE A DAY 09/22/23   Patel, Donika K, DO  propranolol  ER (INDERAL  LA) 120 MG 24 hr capsule TAKE 1 CAPSULE BY MOUTH 2 TIMES A DAY OK TO TAKE EXTRA CAPSULE AS NEEDED FOR WORSENING TREMOR 08/01/23   Patel, Donika K, DO  spironolactone  (ALDACTONE ) 25 MG tablet Take 0.5 tablets (12.5 mg total) by mouth daily. 09/08/23   Chandrasekhar, Caretha Chapel, MD  zolpidem  (AMBIEN  CR) 12.5 MG CR tablet Take 12.5 mg by mouth daily at 6 (six) AM.    [provider]      Allergies    Patient has no known allergies.    Review of Systems   Review of Systems  All other systems reviewed and are negative.   Physical Exam Updated Vital Signs BP 110/77   Pulse 73   Temp 97.7 F (36.5 C) (Oral)   Resp 18   SpO2 98%  Physical Exam Vitals and nursing note reviewed.  Constitutional:      General: He is not in acute distress.    Appearance: Normal appearance.  HENT:     Head: Normocephalic.     Comments: There is evidence of left-sided periocular ecchymosis. Eyes:     General:        Right eye: No discharge.  Left eye: No discharge.     Comments: Extraocular movements are intact without any evidence of entrapment.  Cardiovascular:     Comments: Regular rate and rhythm.  S1/S2 are distinct without any evidence of murmur, rubs, or gallops.  Radial pulses are 2+ bilaterally.  Dorsalis pedis pulses are 2+ bilaterally.  No evidence of pedal edema. Pulmonary:     Comments: Clear to auscultation bilaterally.  Normal effort.  No respiratory distress.  No evidence of wheezes, rales, or rhonchi heard throughout. Abdominal:     General: Abdomen is flat. Bowel sounds are normal. There is no distension.     Tenderness: There is no abdominal tenderness. There is no guarding or rebound.  Musculoskeletal:        General: Normal range of motion.     Cervical back: Neck supple.  Skin:    General: Skin is warm and dry.     Findings: No rash.  Neurological:     General: No focal deficit  present.     Mental Status: He is lethargic.     Comments: There is some of visual field deficits in the 6:00 to 9 o'clock position in the right eye.  Rest the patient's cranial nerves are intact.  Patient is overall somnolent but does respond to questions.  5/5 strength to the upper and lower extremities.  Normal sensation to the upper and lower extremities.  Psychiatric:        Mood and Affect: Mood normal.        Behavior: Behavior normal.     ED Results / Procedures / Treatments   Labs (all labs ordered are listed, but only abnormal results are displayed) Labs Reviewed  CBC WITH DIFFERENTIAL/PLATELET - Abnormal; Notable for the following components:      Result Value   RBC 3.04 (*)    Hemoglobin 10.5 (*)    HCT 31.0 (*)    MCV 102.0 (*)    MCH 34.5 (*)    All other components within normal limits  BASIC METABOLIC PANEL WITH GFR - Abnormal; Notable for the following components:   Sodium 134 (*)    Chloride 93 (*)    Glucose, Bld 121 (*)    All other components within normal limits  CK    EKG None  Radiology CT ANGIO HEAD NECK W WO CM Addendum Date: 10/23/2023 ADDENDUM REPORT: 10/23/2023 17:38 ADDENDUM: CTA neck impression #1 and CTA head impression #1 called by telephone at the time of interpretation on 10/23/2023 at 5:37 pm to provider [facility] , who verbally acknowledged these results. Electronically Signed   By: Bascom Lily D.O.   On: 10/23/2023 17:38   Result Date: 10/23/2023 CLINICAL DATA:  Provided history: Facial trauma, blunt. Multiple falls. Bruising to left eye. Posterior head laceration and bruising. On Plavix . Headache. EXAM: CT ANGIOGRAPHY HEAD AND NECK WITH AND WITHOUT CONTRAST TECHNIQUE: Multidetector CT imaging of the head and neck was performed using the standard protocol during bolus administration of intravenous contrast. Multiplanar CT image reconstructions and MIPs were obtained to evaluate the vascular anatomy. Carotid stenosis measurements (when  applicable) are obtained utilizing NASCET criteria, using the distal internal carotid diameter as the denominator. RADIATION DOSE REDUCTION: This exam was performed according to the departmental dose-optimization program which includes automated exposure control, adjustment of the mA and/or kV according to patient size and/or use of iterative reconstruction technique. CONTRAST:  75mL OMNIPAQUE  IOHEXOL  350 MG/ML SOLN COMPARISON:  Noncontrast head CT, maxillofacial CT and cervical spine CT  examinations performed earlier today 10/23/2023. MRA head 02/17/2003. FINDINGS: CTA NECK FINDINGS Aortic arch: Standard aortic branching. The visualized thoracic aorta is normal in caliber. Atherosclerotic plaque within the visualized thoracic aorta and proximal major branch vessels of the neck. No hemodynamically significant innominate or proximal subclavian artery stenosis. Right carotid system: CCA and ICA patent within the neck without hemodynamically significant stenosis (50% or greater). Mild-to-moderate atherosclerotic plaque about the carotid bifurcation and within the proximal ICA. Left carotid system: CCA and ICA patent within the neck without hemodynamically significant stenosis. Mild atherosclerotic plaque within the proximal ICA. Vertebral arteries: Patent within the neck. The left vertebral artery is dominant. Calcified atherosclerotic plaque at the right vertebral artery origin resulting in a moderate/severe stenosis. 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 The right vertebral artery V3 segment and proximal V4 segment were not included in the field of view on the prior MRA head of 02/17/2003. The cervical left vertebral artery demonstrates no significant stenosis or atherosclerotic disease. Skeleton: Please see cervical spine CT performed earlier today for description of cervical and upper thoracic spine findings. Other neck: No neck mass or cervical lymphadenopathy. Upper  chest: No consolidation within the imaged lung apices. Review of the MIP images confirms the above findings CTA HEAD FINDINGS Anterior circulation: The intracranial internal carotid arteries are patent. Nonstenotic atherosclerotic plaque within the paraclinoid right ICA. The M1 middle cerebral arteries are patent. No M2 proximal branch occlusion or high-grade proximal stenosis. The anterior cerebral arteries are patent. Mildly hypoplastic right A1 segment. No intracranial aneurysm is identified. 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 The right posterior inferior cerebellar artery is also poorly delineated proximally. There is some enhancement within the right vertebral artery distal V4 segment, likely due to retrograde flow. The intracranial left vertebral artery is patent without stenosis or significant atherosclerotic disease. The basilar artery is patent. The posterior cerebral arteries are patent. A left posterior communicating artery is present. The right posterior communicating artery is diminutive or absent. Venous sinuses: The superior sagittal sinus is poorly assessed due to contrast timing. Within the limitations of contrast timing, no convincing thrombus elsewhere. Anatomic variants: As described. Review of the MIP images confirms the above findings Attempts are being made to reach the ordering provider at this time. IMPRESSION: CTA neck: 1. 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 Additionally, there is a moderate/severe stenosis at the right vertebral artery origin. 2. The (dominant) left vertebral artery is patent within the neck without stenosis. 3. The common carotid and internal carotid arteries are patent within the neck without hemodynamically significant stenosis. Atherosclerotic plaque bilaterally, as described. 4. Aortic Atherosclerosis (ICD10-I70.0). 5. Please refer to the  cervical spine CT performed earlier today for a description of cervical and upper thoracic spine findings, including multiple fractures. CTA head: 1. 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 There is some reconstitution of enhancement within the distal V4 segment, likely due to retrograde flow. The right posterior inferior cerebellar artery (PICA) is also poorly delineated proximally. 2. No proximal intracranial large vessel occlusion or high-grade proximal arterial stenosis identified elsewhere. Electronically Signed: By: Bascom Lily D.O. On: 10/23/2023 17:19   CT Maxillofacial Wo Contrast Result Date: 10/23/2023 CLINICAL DATA:  Facial trauma.  Falls.  Bruising to left eye. EXAM: CT MAXILLOFACIAL WITHOUT CONTRAST TECHNIQUE: Multidetector CT imaging  of the maxillofacial structures was performed. Multiplanar CT image reconstructions were also generated. RADIATION DOSE REDUCTION: This exam was performed according to the departmental dose-optimization program which includes automated exposure control, adjustment of the mA and/or kV according to patient size and/or use of iterative reconstruction technique. COMPARISON:  Head CT earlier today FINDINGS: Osseous: No acute fracture of the zygomatic arches, mandibles or nasal bone. Slight undulation of the nasal septum. Temporomandibular joints are congruent. Intact maxilla and pterygoid plates. Orbits: No acute orbital fracture. No globe injury. Bilateral cataract resection. Sinuses: No sinus fracture or hemosinus. Minor mucosal thickening of right maxillary sinus. Soft tissues: Soft tissue edema involving the left side of the face. Small left periorbital hematoma. Limited intracranial: Assessed on head CT earlier today IMPRESSION: 1. Soft tissue edema involving the left side of the face. Small left periorbital hematoma. 2. No acute facial bone fracture. Electronically Signed   By: Chadwick Colonel M.D.   On:  10/23/2023 15:46   CT Head Wo Contrast Addendum Date: 10/23/2023 ADDENDUM REPORT: 10/23/2023 15:33 ADDENDUM: CT head impression #2 and CT cervical spine impression #1 called by telephone at the time of interpretation on 10/23/2023 at 2:35 pm to provider MATTHEW TRIFAN , who verbally acknowledged these results. Electronically Signed   By: Bascom Lily D.O.   On: 10/23/2023 15:33   Result Date: 10/23/2023 CLINICAL DATA:  Provided history: Head trauma, minor. Neck trauma. Additional history provided: Multiple falls, bruising to left eye, laceration and bruising at back of head, on Plavix . EXAM: CT HEAD WITHOUT CONTRAST CT CERVICAL SPINE WITHOUT CONTRAST TECHNIQUE: Multidetector CT imaging of the head and cervical spine was performed following the standard protocol without intravenous contrast. Multiplanar CT image reconstructions of the cervical spine were also generated. RADIATION DOSE REDUCTION: This exam was performed according to the departmental dose-optimization program which includes automated exposure control, adjustment of the mA and/or kV according to patient size and/or use of iterative reconstruction technique. COMPARISON:  CT head and CT cervical spine 07/27/2023. FINDINGS: CT HEAD FINDINGS Brain: Generalized cerebral atrophy. 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 Background patchy and ill-defined hypoattenuation within the cerebral white matter, nonspecific but compatible with moderate chronic small vessel ischemic disease. There is no acute intracranial hemorrhage. No extra-axial fluid collection. No evidence of an intracranial mass. No midline shift. Vascular: No hyperdense vessel.  Atherosclerotic calcifications. Skull: No calvarial fracture or aggressive osseous lesion. Sinuses/Orbits: No mass or acute finding within the imaged orbits.  Minimal mucosal thickening within the right maxillary sinus. Other: Posterior scalp hematoma. CT CERVICAL SPINE FINDINGS Alignment: There is now minimal displacement at sites of the known unhealed fractures of the C1 posterior arch, new from the prior CT of 07/27/2023. 4 mm of distraction at site of a chronic unhealed type 2 odontoid fracture, unchanged. Mild retrolisthesis of the dens fracture fragment, also unchanged. 3 mm C7-T1 grade 1 anterolisthesis, unchanged. Skull base and vertebrae: Known unhealed fractures within the C1 posterior arch bilaterally, now with minimal displacement at both fracture sites. Known chronic unhealed type 2 dens fracture with 4 mm of distraction and mild retrolisthesis, unchanged. The basion-dental and atlantodental intervals are not widened. Prior C3-C4 ACDF. No evidence of hardware fracture. No appreciable mature osseous fusion across the C3-C4 disc space. 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 Solid arthrodesis at C4-C5 and C5-C6. Bridging ventral osteophyte at C6-C7. Ventral osteophytes at C7-T1 and  within the visible upper thoracic spine. Minimally displaced fracture through a C7-T1 ventral osteophyte (at its junction with the T1 vertebral body), new from the prior CT and likely acute (for instance as seen on series 6, image 25). Prior T3 posterior decompression. Degenerative versus postsurgical fusion of the T2-T3 and T3-T4 vertebrae. Soft tissues and spinal canal: No consolidation within the imaged lung apices. No visible pneumothorax. Disc levels: Postoperative changes as described above. Superimposed spondylosis at the cervical and visible upper thoracic levels. No appreciable high-grade spinal canal stenosis. Multilevel bony neural foraminal narrowing. Upper chest: No consolidation within the imaged lung apices. No visible pneumothorax. IMPRESSION: CT head: 1. No acute intracranial hemorrhage. 2. 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 Consider a brain MRI for further evaluation. 3. Background parenchymal atrophy and chronic small vessel ischemic disease. 4. Posterior scalp hematoma. 5. Minor right maxillary sinus mucosal thickening. CT cervical spine: 1. Minimally displaced fracture of a C7-T1 ventral osteophyte (at its junction with the T1 vertebral body). This is new from the prior cervical spine CT of 07/27/2023 and likely acute. 2. Minimal displacement now present at site of known unhealed C1 posterior arch fractures. 3. Unchanged 4 mm distraction and mild retrolisthesis at site of a known chronic unhealed type 2 dens fracture. 4. Mild C7-T1 grade 1 anterolisthesis, unchanged. 5. Spondylosis and postoperative changes at the cervical and visible upper thoracic level (with multilevel vertebral ankylosis), as described within the body of the report. 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 Electronically Signed: By: Bascom Lily D.O. On: 10/23/2023 14:19   CT Cervical Spine Wo Contrast Addendum Date: 10/23/2023 ADDENDUM REPORT: 10/23/2023 15:33 ADDENDUM: CT head impression #2 and CT cervical spine impression #1 called by telephone at the time of interpretation on 10/23/2023 at 2:35 pm to provider MATTHEW TRIFAN , who verbally acknowledged these results. Electronically Signed   By: Bascom Lily D.O.   On: 10/23/2023 15:33   Result Date: 10/23/2023 CLINICAL DATA:  Provided history: Head trauma, minor. Neck trauma. Additional history provided: Multiple falls, bruising to left eye, laceration and bruising at back of head, on Plavix . EXAM: CT HEAD WITHOUT CONTRAST CT CERVICAL SPINE WITHOUT CONTRAST TECHNIQUE: Multidetector CT imaging of the head and cervical spine was performed following the standard protocol without intravenous contrast. Multiplanar CT image reconstructions of the cervical spine were also generated. RADIATION DOSE REDUCTION: This exam was performed  according to the departmental dose-optimization program which includes automated exposure control, adjustment of the mA and/or kV according to patient size and/or use of iterative reconstruction technique. COMPARISON:  CT head and CT cervical spine 07/27/2023. FINDINGS: CT HEAD FINDINGS Brain: Generalized cerebral atrophy. 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 Background patchy and ill-defined hypoattenuation within the cerebral white matter, nonspecific but compatible with moderate chronic small vessel ischemic disease. There is no acute intracranial hemorrhage. No extra-axial fluid collection. No evidence of an intracranial mass. No midline shift. Vascular: No hyperdense vessel.  Atherosclerotic calcifications. Skull: No calvarial fracture or aggressive osseous lesion. Sinuses/Orbits: No mass or acute finding within the imaged orbits. Minimal mucosal thickening within the right maxillary sinus. Other: Posterior scalp hematoma. CT CERVICAL SPINE FINDINGS Alignment: There is now minimal displacement at sites of the known unhealed fractures of the C1 posterior arch, new from the prior  CT of 07/27/2023. 4 mm of distraction at site of a chronic unhealed type 2 odontoid fracture, unchanged. Mild retrolisthesis of the dens fracture fragment, also unchanged. 3 mm C7-T1 grade 1 anterolisthesis, unchanged. Skull base and vertebrae: Known unhealed fractures within the C1 posterior arch bilaterally, now with minimal displacement at both fracture sites. Known chronic unhealed type 2 dens fracture with 4 mm of distraction and mild retrolisthesis, unchanged. The basion-dental and atlantodental intervals are not widened. Prior C3-C4 ACDF. No evidence of hardware fracture. No appreciable mature osseous fusion across the C3-C4 disc space. 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 Solid  arthrodesis at C4-C5 and C5-C6. Bridging ventral osteophyte at C6-C7. Ventral osteophytes at C7-T1 and within the visible upper thoracic spine. Minimally displaced fracture through a C7-T1 ventral osteophyte (at its junction with the T1 vertebral body), new from the prior CT and likely acute (for instance as seen on series 6, image 25). Prior T3 posterior decompression. Degenerative versus postsurgical fusion of the T2-T3 and T3-T4 vertebrae. Soft tissues and spinal canal: No consolidation within the imaged lung apices. No visible pneumothorax. Disc levels: Postoperative changes as described above. Superimposed spondylosis at the cervical and visible upper thoracic levels. No appreciable high-grade spinal canal stenosis. Multilevel bony neural foraminal narrowing. Upper chest: No consolidation within the imaged lung apices. No visible pneumothorax. IMPRESSION: CT head: 1. No acute intracranial hemorrhage. 2. 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 Consider a brain MRI for further evaluation. 3. Background parenchymal atrophy and chronic small vessel ischemic disease. 4. Posterior scalp hematoma. 5. Minor right maxillary sinus mucosal thickening. CT cervical spine: 1. Minimally displaced fracture of a C7-T1 ventral osteophyte (at its junction with the T1 vertebral body). This is new from the prior cervical spine CT of 07/27/2023 and likely acute. 2. Minimal displacement now present at site of known unhealed C1 posterior arch fractures. 3. Unchanged 4 mm distraction and mild retrolisthesis at site of a known chronic unhealed type 2 dens fracture. 4. Mild C7-T1 grade 1 anterolisthesis, unchanged. 5. Spondylosis and postoperative changes at the cervical and visible upper thoracic level (with multilevel vertebral ankylosis), as described within the body of the report. Suspected ongoing pseudoarthrosis  at C3-C4. Electronically Signed: By: Bascom Lily D.O. On: 10/23/2023 14:19    Procedures .Critical Care  Performed by: Darletta Ehrich, PA-C Authorized by: Darletta Ehrich, PA-C   Critical care provider statement:    Critical care time (minutes):  35   Critical care was necessary to treat or prevent imminent or life-threatening deterioration of the following conditions:  Trauma   Critical care was time spent personally by me on the following activities:  Blood draw for specimens, development of treatment plan with patient or surrogate, discussions with consultants, ordering and performing treatments and interventions, ordering and review of laboratory studies, ordering and review of radiographic studies and pulse oximetry     Medications Ordered in ED Medications  sodium chloride  0.9 % bolus 1,000 mL (0 mLs Intravenous Stopped 10/23/23 1717)  iohexol  (OMNIPAQUE ) 350 MG/ML injection 100 mL (75 mLs Intravenous Contrast Given 10/23/23 1632)    ED Course/ Medical Decision Making/ A&P Clinical Course as of 10/23/23 1742  Thu Oct 23, 2023  1540 I spoke with Dr. Michale Age with neurosurgery who just recommended c-collar and he will service consult. [CF]  [id number] I spoke with Dr. Cleone Dad with neurology who stated that this is an age-indeterminate stroke and  the patient did have an echocardiogram a week ago.  She recommends CT angio head and neck but does not feel that he needs to be admitted from a neurology perspective. [CF]  1648 I was notified by nursing staff that the patient is becoming more somnolent.  I reevaluated the patient at bedside and he is alert and oriented x 4.  He does answer all questions appropriately.  [CF]  1702 I was also notified by nursing staff that the patient would like to leave AGAINST MEDICAL ADVICE.  I went talk with him at length about the risks of leaving AGAINST MEDICAL ADVICE including but not limited to death.  He understands that he is assuming all of the risk for  leaving AGAINST MEDICAL ADVICE.  He does answer all my questions appropriately and is far as I am concerned does have capacity to make medical decisions. [CF]  1740 CBC with Differential(!) Mild anemia. [CF]  1740 Basic metabolic panel(!) Mild hyponatremia and hypochloremia. [CF]  1740 CK Negative. [CF]  1741 CT ANGIO HEAD NECK W WO CM I personally ordered and interpreted the study and spoke with the radiologist.  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 [CF]  1741 CT Maxillofacial Wo Contrast No evidence of fractures.  I do agree with radiologist interpretation. [CF]  1741 CT Head Wo Contrast Looks like there is evidence of ischemic stroke age-indeterminate.  I do agree with radiologist interpretation. [CF]  1741 CT Cervical Spine Wo Contrast Patient does have evidence of a cervical fracture.  I do agree with the radiologist interpretation. [CF]    Clinical Course User Index [CF] Darletta Ehrich, PA-C   {   Click here for ABCD2, HEART and other calculators  Medical Decision Making Gabriel Richardson is a 78 y.o. male patient who presents to the emergency department today for further evaluation of a fall.  Given that the surrounding events are questionable and I am unclear on when the patient's last known normal was we will proceed with CT scan and cervical spine CT.  These were ordered in triage.  There is evidence of of infarction in the brain.  Questionable on timeline.  Patient is overall LVO negative.  Will add on a CT maxillofacial given that the patient does have some periocular ecchymosis.  No signs of entrapment on exam.  Patient is somnolent but does arouse to questions.  Will place patient in cervical collar given that he does have new cervical spine fracture.  Patient does have some evidence of vertebral artery occlusion and questionable posterior circulation occlusion again age-indeterminate.  As highlighted in ED course, patient willing to go home.  This will be  AGAINST MEDICAL ADVICE.  He understands all of the risks.  The patient walked out without his c-collar on.  I did advise him numerous times that it would be beneficial for him to stay in the hospital but the patient ultimately declined.  He will be leaving AGAINST MEDICAL ADVICE.   Amount and/or Complexity of Data Reviewed Labs: ordered. Decision-making details documented in ED Course. Radiology: ordered. Decision-making details documented in ED Course.  Risk Prescription drug management. Decision regarding hospitalization.    Final Clinical Impression(s) / ED Diagnoses Final diagnoses:  Fall, initial encounter  Altered mental status, unspecified altered mental status type  Closed displaced fracture of seventh cervical vertebra, unspecified fracture morphology, initial encounter (HCC)    Rx / DC Orders ED Discharge Orders     None  Angelyn Kennel [place], [place] 10/23/23 1742    Arvilla Birmingham, MD 10/24/23 [phone]

## 2023-10-24 DIAGNOSIS — S12690A Other displaced fracture of seventh cervical vertebra, initial encounter for closed fracture: Secondary | ICD-10-CM | POA: Diagnosis not present

## 2023-10-28 DIAGNOSIS — E559 Vitamin D deficiency, unspecified: Secondary | ICD-10-CM | POA: Diagnosis not present

## 2023-10-28 DIAGNOSIS — Z1159 Encounter for screening for other viral diseases: Secondary | ICD-10-CM | POA: Diagnosis not present

## 2023-10-28 DIAGNOSIS — M129 Arthropathy, unspecified: Secondary | ICD-10-CM | POA: Diagnosis not present

## 2023-10-28 DIAGNOSIS — G894 Chronic pain syndrome: Secondary | ICD-10-CM | POA: Diagnosis not present

## 2023-10-28 DIAGNOSIS — Z79899 Other long term (current) drug therapy: Secondary | ICD-10-CM | POA: Diagnosis not present

## 2023-10-28 DIAGNOSIS — M542 Cervicalgia: Secondary | ICD-10-CM | POA: Diagnosis not present

## 2023-10-28 DIAGNOSIS — M546 Pain in thoracic spine: Secondary | ICD-10-CM | POA: Diagnosis not present

## 2023-10-29 ENCOUNTER — Other Ambulatory Visit: Payer: Self-pay

## 2023-10-29 DIAGNOSIS — G8929 Other chronic pain: Secondary | ICD-10-CM | POA: Diagnosis not present

## 2023-10-29 DIAGNOSIS — S12691D Other nondisplaced fracture of seventh cervical vertebra, subsequent encounter for fracture with routine healing: Secondary | ICD-10-CM | POA: Diagnosis not present

## 2023-10-29 DIAGNOSIS — F1020 Alcohol dependence, uncomplicated: Secondary | ICD-10-CM | POA: Diagnosis not present

## 2023-10-29 DIAGNOSIS — F02A18 Dementia in other diseases classified elsewhere, mild, with other behavioral disturbance: Secondary | ICD-10-CM | POA: Diagnosis not present

## 2023-10-29 DIAGNOSIS — I6381 Other cerebral infarction due to occlusion or stenosis of small artery: Secondary | ICD-10-CM | POA: Diagnosis not present

## 2023-10-29 NOTE — Patient Instructions (Signed)
 Visit Information  Thank you for taking time to visit with me today. Please don't hesitate to contact me if I can be of assistance to you before our next scheduled appointment.  Your next care management appointment is no further scheduled appointments.   Closing From: Complex Care Management.  Please call the care guide team at [phone] if you need to cancel, schedule, or reschedule an appointment.   Please call the Suicide and Crisis Lifeline: 988 call 1-800-273-TALK (toll free, 24 hour hotline) if you are experiencing a Mental Health or Behavioral Health Crisis or need someone to talk to.  Theodora Fish, RN MSN [place]  VBCI Population Health RN Care Manager Direct Dial: [phone]  Fax: [phone]

## 2023-10-29 NOTE — Patient Outreach (Signed)
 Complex Care Management   Visit Note  10/29/2023  Name:  Gabriel Richardson MRN: [id number] DOB: 07/05/1945  Situation: Referral received for Complex Care Management related to frequent falls I obtained verbal consent from Patient.  Visit completed with Page Boast  on the phone  Background:   Past Medical History:  Diagnosis Date   Ankle fracture    right with syndesmotic disruption   Anxiety    Arthritis    Asthma    Bipolar disorder (HCC)    Cervical stenosis of spinal canal    Closed fracture of right distal fibula 01/07/2019   COPD (chronic obstructive pulmonary disease) (HCC)    Depression    Emphysema of lung (HCC)    Neuromuscular disorder (HCC)    Trigeminal neuralgia    Wears glasses     Assessment: Patient Reported Symptoms:  Cognitive Cognitive Status: Able to follow simple commands, Struggling with memory recall Cognitive/Intellectual Conditions Management [RPT]: None reported or documented in medical history or problem list   Health Maintenance Behaviors: Annual physical exam  Neurological Neurological Review of Symptoms: Other: [place] Neurological Symptoms/Conditions [RPT]: Struggling with memory recall. This has been an ongoing issue for him. Patient with plans to move closer to family next month, hopeful this will help with management of memory concerns and falls. Neurological Management Strategies: Routine screening  HEENT HEENT Symptoms Reported: Not assessed      Cardiovascular Cardiovascular Symptoms Reported: Not assessed    Respiratory Respiratory Symptoms Reported: Not assesed    Endocrine Patient reports the following symptoms related to hypoglycemia or hyperglycemia : Not assessed    Gastrointestinal Gastrointestinal Symptoms Reported: Not assessed      Genitourinary Genitourinary Symptoms Reported: Not assessed    Integumentary Integumentary Symptoms Reported: Not assessed    Musculoskeletal Musculoskelatal Symptoms Reviewed:  Weakness Musculoskeletal Conditions: Other Other Musculoskeletal Conditions: Tremor Musculoskeletal Management Strategies: Medical device, Adequate rest (Patient reports he cancelled [facility] PT. He is not doing any regular exercise.) Musculoskeletal Comment: Patient was seen in the ED 10/23/23 after a fall. He has seen PCP since, most recent today. Patient reports he does not know if he has had a fall since. Patient reports that he does have another follow-up with PCP before he moves next month. Falls in the past year?: Yes Number of falls in past year: 2 or more Was there an injury with Fall?: Yes Fall Risk Category Calculator: 3 Patient Fall Risk Level: High Fall Risk Patient at Risk for Falls Due to: History of fall(s), Impaired balance/gait, Impaired vision, Medication side effect, Other (Comment) (Daily alcohol consumption) Fall risk Follow up: Falls evaluation completed, Education provided, Falls prevention discussed  Psychosocial Psychosocial Symptoms Reported: Not assessed Additional Psychological Details: Patient continues to refuse teaching regarding alcohol cessation.            10/01/2023    3:31 PM  Depression screen PHQ 2/9  Decreased Interest 1  Down, Depressed, Hopeless 0  PHQ - 2 Score 1    There were no vitals filed for this visit.  Medications Reviewed Today   Medications were not reviewed in this encounter     Recommendation:   PCP Follow-up Continue Current Plan of Care  Follow Up Plan:   Closing From:  Complex Care Management. Patient declines further C[facility] follow-up from [facility]. Advised to contact care team or PCP if he changes his mind.  Theodora Fish, RN MSN [place]  VBCI Population Health RN Care Manager Direct Dial: [phone]  Fax: [phone]

## 2023-10-30 DIAGNOSIS — Z79899 Other long term (current) drug therapy: Secondary | ICD-10-CM | POA: Diagnosis not present

## 2023-10-31 DIAGNOSIS — I10 Essential (primary) hypertension: Secondary | ICD-10-CM | POA: Diagnosis not present

## 2023-10-31 DIAGNOSIS — G309 Alzheimer's disease, unspecified: Secondary | ICD-10-CM | POA: Diagnosis not present

## 2023-10-31 DIAGNOSIS — F3170 Bipolar disorder, currently in remission, most recent episode unspecified: Secondary | ICD-10-CM | POA: Diagnosis not present

## 2023-10-31 DIAGNOSIS — M542 Cervicalgia: Secondary | ICD-10-CM | POA: Diagnosis not present

## 2023-10-31 DIAGNOSIS — F02A4 Dementia in other diseases classified elsewhere, mild, with anxiety: Secondary | ICD-10-CM | POA: Diagnosis not present

## 2023-10-31 DIAGNOSIS — G8929 Other chronic pain: Secondary | ICD-10-CM | POA: Diagnosis not present

## 2023-10-31 DIAGNOSIS — J4489 Other specified chronic obstructive pulmonary disease: Secondary | ICD-10-CM | POA: Diagnosis not present

## 2023-10-31 DIAGNOSIS — F02A3 Dementia in other diseases classified elsewhere, mild, with mood disturbance: Secondary | ICD-10-CM | POA: Diagnosis not present

## 2023-10-31 DIAGNOSIS — G500 Trigeminal neuralgia: Secondary | ICD-10-CM | POA: Diagnosis not present

## 2023-11-02 DIAGNOSIS — G309 Alzheimer's disease, unspecified: Secondary | ICD-10-CM | POA: Diagnosis not present

## 2023-11-02 DIAGNOSIS — G500 Trigeminal neuralgia: Secondary | ICD-10-CM | POA: Diagnosis not present

## 2023-11-02 DIAGNOSIS — F02A3 Dementia in other diseases classified elsewhere, mild, with mood disturbance: Secondary | ICD-10-CM | POA: Diagnosis not present

## 2023-11-02 DIAGNOSIS — G8929 Other chronic pain: Secondary | ICD-10-CM | POA: Diagnosis not present

## 2023-11-02 DIAGNOSIS — F02A4 Dementia in other diseases classified elsewhere, mild, with anxiety: Secondary | ICD-10-CM | POA: Diagnosis not present

## 2023-11-02 DIAGNOSIS — J4489 Other specified chronic obstructive pulmonary disease: Secondary | ICD-10-CM | POA: Diagnosis not present

## 2023-11-02 DIAGNOSIS — F3170 Bipolar disorder, currently in remission, most recent episode unspecified: Secondary | ICD-10-CM | POA: Diagnosis not present

## 2023-11-02 DIAGNOSIS — I10 Essential (primary) hypertension: Secondary | ICD-10-CM | POA: Diagnosis not present

## 2023-11-02 DIAGNOSIS — M542 Cervicalgia: Secondary | ICD-10-CM | POA: Diagnosis not present

## 2023-11-03 ENCOUNTER — Other Ambulatory Visit (HOSPITAL_BASED_OUTPATIENT_CLINIC_OR_DEPARTMENT_OTHER): Payer: Self-pay | Admitting: Family Medicine

## 2023-11-03 DIAGNOSIS — I10 Essential (primary) hypertension: Secondary | ICD-10-CM | POA: Diagnosis not present

## 2023-11-03 DIAGNOSIS — M542 Cervicalgia: Secondary | ICD-10-CM | POA: Diagnosis not present

## 2023-11-03 DIAGNOSIS — G309 Alzheimer's disease, unspecified: Secondary | ICD-10-CM | POA: Diagnosis not present

## 2023-11-03 DIAGNOSIS — F02A4 Dementia in other diseases classified elsewhere, mild, with anxiety: Secondary | ICD-10-CM | POA: Diagnosis not present

## 2023-11-03 DIAGNOSIS — F02A3 Dementia in other diseases classified elsewhere, mild, with mood disturbance: Secondary | ICD-10-CM | POA: Diagnosis not present

## 2023-11-03 DIAGNOSIS — J4489 Other specified chronic obstructive pulmonary disease: Secondary | ICD-10-CM | POA: Diagnosis not present

## 2023-11-03 DIAGNOSIS — S12691D Other nondisplaced fracture of seventh cervical vertebra, subsequent encounter for fracture with routine healing: Secondary | ICD-10-CM

## 2023-11-03 DIAGNOSIS — G8929 Other chronic pain: Secondary | ICD-10-CM | POA: Diagnosis not present

## 2023-11-03 DIAGNOSIS — F3170 Bipolar disorder, currently in remission, most recent episode unspecified: Secondary | ICD-10-CM | POA: Diagnosis not present

## 2023-11-03 DIAGNOSIS — G500 Trigeminal neuralgia: Secondary | ICD-10-CM | POA: Diagnosis not present

## 2023-11-05 ENCOUNTER — Ambulatory Visit: Admitting: Cardiology

## 2023-11-06 DIAGNOSIS — G8929 Other chronic pain: Secondary | ICD-10-CM | POA: Diagnosis not present

## 2023-11-06 DIAGNOSIS — M542 Cervicalgia: Secondary | ICD-10-CM | POA: Diagnosis not present

## 2023-11-06 DIAGNOSIS — J4489 Other specified chronic obstructive pulmonary disease: Secondary | ICD-10-CM | POA: Diagnosis not present

## 2023-11-06 DIAGNOSIS — G500 Trigeminal neuralgia: Secondary | ICD-10-CM | POA: Diagnosis not present

## 2023-11-06 DIAGNOSIS — F02A3 Dementia in other diseases classified elsewhere, mild, with mood disturbance: Secondary | ICD-10-CM | POA: Diagnosis not present

## 2023-11-06 DIAGNOSIS — I10 Essential (primary) hypertension: Secondary | ICD-10-CM | POA: Diagnosis not present

## 2023-11-06 DIAGNOSIS — F3170 Bipolar disorder, currently in remission, most recent episode unspecified: Secondary | ICD-10-CM | POA: Diagnosis not present

## 2023-11-06 DIAGNOSIS — F02A4 Dementia in other diseases classified elsewhere, mild, with anxiety: Secondary | ICD-10-CM | POA: Diagnosis not present

## 2023-11-06 DIAGNOSIS — G309 Alzheimer's disease, unspecified: Secondary | ICD-10-CM | POA: Diagnosis not present

## 2023-11-07 DIAGNOSIS — M546 Pain in thoracic spine: Secondary | ICD-10-CM | POA: Diagnosis not present

## 2023-11-07 DIAGNOSIS — Z79899 Other long term (current) drug therapy: Secondary | ICD-10-CM | POA: Diagnosis not present

## 2023-11-07 DIAGNOSIS — G894 Chronic pain syndrome: Secondary | ICD-10-CM | POA: Diagnosis not present

## 2023-11-07 DIAGNOSIS — Y92009 Unspecified place in unspecified non-institutional (private) residence as the place of occurrence of the external cause: Secondary | ICD-10-CM | POA: Diagnosis not present

## 2023-11-07 DIAGNOSIS — R42 Dizziness and giddiness: Secondary | ICD-10-CM | POA: Diagnosis not present

## 2023-11-07 DIAGNOSIS — W19XXXD Unspecified fall, subsequent encounter: Secondary | ICD-10-CM | POA: Diagnosis not present

## 2023-11-07 DIAGNOSIS — E559 Vitamin D deficiency, unspecified: Secondary | ICD-10-CM | POA: Diagnosis not present

## 2023-11-07 DIAGNOSIS — M542 Cervicalgia: Secondary | ICD-10-CM | POA: Diagnosis not present

## 2023-11-07 DIAGNOSIS — F0390 Unspecified dementia without behavioral disturbance: Secondary | ICD-10-CM | POA: Diagnosis not present

## 2023-11-10 ENCOUNTER — Encounter: Payer: Self-pay | Admitting: *Deleted

## 2023-11-10 DIAGNOSIS — F3170 Bipolar disorder, currently in remission, most recent episode unspecified: Secondary | ICD-10-CM | POA: Diagnosis not present

## 2023-11-10 DIAGNOSIS — F02A3 Dementia in other diseases classified elsewhere, mild, with mood disturbance: Secondary | ICD-10-CM | POA: Diagnosis not present

## 2023-11-10 DIAGNOSIS — I10 Essential (primary) hypertension: Secondary | ICD-10-CM | POA: Diagnosis not present

## 2023-11-10 DIAGNOSIS — G309 Alzheimer's disease, unspecified: Secondary | ICD-10-CM | POA: Diagnosis not present

## 2023-11-10 DIAGNOSIS — G500 Trigeminal neuralgia: Secondary | ICD-10-CM | POA: Diagnosis not present

## 2023-11-10 DIAGNOSIS — G8929 Other chronic pain: Secondary | ICD-10-CM | POA: Diagnosis not present

## 2023-11-10 DIAGNOSIS — Z6825 Body mass index (BMI) 25.0-25.9, adult: Secondary | ICD-10-CM | POA: Diagnosis not present

## 2023-11-10 DIAGNOSIS — F02A4 Dementia in other diseases classified elsewhere, mild, with anxiety: Secondary | ICD-10-CM | POA: Diagnosis not present

## 2023-11-10 DIAGNOSIS — R296 Repeated falls: Secondary | ICD-10-CM | POA: Diagnosis not present

## 2023-11-10 DIAGNOSIS — M4807 Spinal stenosis, lumbosacral region: Secondary | ICD-10-CM | POA: Diagnosis not present

## 2023-11-10 DIAGNOSIS — J4489 Other specified chronic obstructive pulmonary disease: Secondary | ICD-10-CM | POA: Diagnosis not present

## 2023-11-10 DIAGNOSIS — M542 Cervicalgia: Secondary | ICD-10-CM | POA: Diagnosis not present

## 2023-11-11 ENCOUNTER — Encounter: Payer: Self-pay | Admitting: *Deleted

## 2023-11-11 DIAGNOSIS — G500 Trigeminal neuralgia: Secondary | ICD-10-CM | POA: Diagnosis not present

## 2023-11-11 DIAGNOSIS — F3170 Bipolar disorder, currently in remission, most recent episode unspecified: Secondary | ICD-10-CM | POA: Diagnosis not present

## 2023-11-11 DIAGNOSIS — G309 Alzheimer's disease, unspecified: Secondary | ICD-10-CM | POA: Diagnosis not present

## 2023-11-11 DIAGNOSIS — G8929 Other chronic pain: Secondary | ICD-10-CM | POA: Diagnosis not present

## 2023-11-11 DIAGNOSIS — I10 Essential (primary) hypertension: Secondary | ICD-10-CM | POA: Diagnosis not present

## 2023-11-11 DIAGNOSIS — F02A3 Dementia in other diseases classified elsewhere, mild, with mood disturbance: Secondary | ICD-10-CM | POA: Diagnosis not present

## 2023-11-11 DIAGNOSIS — M542 Cervicalgia: Secondary | ICD-10-CM | POA: Diagnosis not present

## 2023-11-11 DIAGNOSIS — J4489 Other specified chronic obstructive pulmonary disease: Secondary | ICD-10-CM | POA: Diagnosis not present

## 2023-11-11 DIAGNOSIS — F02A4 Dementia in other diseases classified elsewhere, mild, with anxiety: Secondary | ICD-10-CM | POA: Diagnosis not present

## 2023-11-12 ENCOUNTER — Ambulatory Visit: Attending: Emergency Medicine | Admitting: Emergency Medicine

## 2023-11-12 NOTE — Progress Notes (Deleted)
 Cardiology Office Note:    Date:  11/12/2023  ID:  Gabriel Richardson, DOB 1946-03-19, MRN [id number] PCP: Gabriel Harvey, MD  [facility] Providers Cardiologist:  Soyla DELENA Merck, MD { Click to update primary MD,subspecialty MD or APP then REFRESH:1}    {Click to Open Review  :1}   Patient Profile:       Chief Complaint: *** History of Present Illness:  Gabriel Richardson is a 78 y.o. male with visit-pertinent history of HFrEF  He was recently hospitalized on 07/28/2023 and diagnosed with HFrEF.  During hospital stay experienced agitation and hallucinations he was originally found on the floor at home acutely confused.  His neighbor had found him on the floor acutely confused and hallucinating.  Cardiology was consulted given elevated troponin and ECG with diffuse ST elevation and PR depression.  Clinical suspicion was high for myocarditis at that time.  Cardiology recommended colchicine , ibuprofen .  Initially on anticoagulation, he was de-escalated to Plavix  due to frequent falls which occur up to 10 times a month.  His echocardiogram showed LVEF 40-45%, base to mid ventricular hyperdynamic and distal LV and apex akinetic with laminated LV clot.  Findings concerning for mid to distal LAD occlusion.  Downtrending troponin suggest the patient has completed potential infarction.  At the time patient was not interested in further ischemic workup so he did not undergo catheterization or cardiac MRI.  His troponin levels were elevated but decreased to 38,519.  He does have history of alcohol abuse.  He was not started on OAC due to his frequent falls.  He was discharged on Plavix .  Diagnosis unlikely be pericarditis and therapies for treatment.SABRA  He was last seen in clinic on 09/08/2023.  He had NYHA class II, stage C symptoms.  He was started on spironolactone  12.5 mg daily.  He underwent repeat echocardiogram on 10/09/2023 showing apical wall motion has recovered, no thrombus is seen.  LVEF 65-70%, no  RWMA, mild LVH, grade 1 DD, RV function normal, RV size mildly enlarged, normal PSAP, mild mitral valve regurgitation  Discussed the use of AI scribe software for clinical note transcription with the patient, who gave verbal consent to proceed.  History of Present Illness     Review of systems:  Please see the history of present illness. All other systems are reviewed and otherwise negative. ***      Studies Reviewed:        ***  Risk Assessment/Calculations:   {Does this patient have ATRIAL FIBRILLATION?:[id number]} No BP recorded.  {Refresh Note OR Click here to enter BP  :1}***        Physical Exam:   VS:  There were no vitals taken for this visit.   Wt Readings from Last 3 Encounters:  09/08/23 185 lb 6.4 oz (84.1 kg)  08/19/23 158 lb (71.7 kg)  07/27/23 176 lb 5.9 oz (80 kg)    GEN: Well nourished, well developed in no acute distress NECK: No JVD; No carotid bruits CARDIAC: ***RRR, no murmurs, rubs, gallops RESPIRATORY:  Clear to auscultation without rales, wheezing or rhonchi  ABDOMEN: Soft, non-tender, non-distended EXTREMITIES:  No edema; No acute deformity ***      Assessment and Plan:  HFrEF Echocardiogram 07/2023 with LVEF 40-45% demonstrating LAD distribution regionals with thrombus at the apex with LAD territory wall motion abnormalities Echocardiogram 09/2023 showed LVEF 65 to 70%, no RWMA, mild LVH, grade 1 DD, RV function normal and mildly enlarged  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 stress test, cath, DCCV, TEE, etc)?   Press F2        :[id number]}  Dispo:  No follow-ups on file.  Signed, Gabriel LITTIE Louis, NP

## 2023-11-18 DIAGNOSIS — F02A3 Dementia in other diseases classified elsewhere, mild, with mood disturbance: Secondary | ICD-10-CM | POA: Diagnosis not present

## 2023-11-18 DIAGNOSIS — G8929 Other chronic pain: Secondary | ICD-10-CM | POA: Diagnosis not present

## 2023-11-18 DIAGNOSIS — F02A4 Dementia in other diseases classified elsewhere, mild, with anxiety: Secondary | ICD-10-CM | POA: Diagnosis not present

## 2023-11-18 DIAGNOSIS — I10 Essential (primary) hypertension: Secondary | ICD-10-CM | POA: Diagnosis not present

## 2023-11-18 DIAGNOSIS — G500 Trigeminal neuralgia: Secondary | ICD-10-CM | POA: Diagnosis not present

## 2023-11-18 DIAGNOSIS — G309 Alzheimer's disease, unspecified: Secondary | ICD-10-CM | POA: Diagnosis not present

## 2023-11-18 DIAGNOSIS — M542 Cervicalgia: Secondary | ICD-10-CM | POA: Diagnosis not present

## 2023-11-18 DIAGNOSIS — J4489 Other specified chronic obstructive pulmonary disease: Secondary | ICD-10-CM | POA: Diagnosis not present

## 2023-11-18 DIAGNOSIS — F3170 Bipolar disorder, currently in remission, most recent episode unspecified: Secondary | ICD-10-CM | POA: Diagnosis not present

## 2023-11-20 DIAGNOSIS — G500 Trigeminal neuralgia: Secondary | ICD-10-CM | POA: Diagnosis not present

## 2023-11-20 DIAGNOSIS — G309 Alzheimer's disease, unspecified: Secondary | ICD-10-CM | POA: Diagnosis not present

## 2023-11-20 DIAGNOSIS — F02A4 Dementia in other diseases classified elsewhere, mild, with anxiety: Secondary | ICD-10-CM | POA: Diagnosis not present

## 2023-11-20 DIAGNOSIS — M542 Cervicalgia: Secondary | ICD-10-CM | POA: Diagnosis not present

## 2023-11-20 DIAGNOSIS — F02A3 Dementia in other diseases classified elsewhere, mild, with mood disturbance: Secondary | ICD-10-CM | POA: Diagnosis not present

## 2023-11-20 DIAGNOSIS — J4489 Other specified chronic obstructive pulmonary disease: Secondary | ICD-10-CM | POA: Diagnosis not present

## 2023-11-20 DIAGNOSIS — I10 Essential (primary) hypertension: Secondary | ICD-10-CM | POA: Diagnosis not present

## 2023-11-20 DIAGNOSIS — G8929 Other chronic pain: Secondary | ICD-10-CM | POA: Diagnosis not present

## 2023-11-20 DIAGNOSIS — F3170 Bipolar disorder, currently in remission, most recent episode unspecified: Secondary | ICD-10-CM | POA: Diagnosis not present

## 2023-11-24 DIAGNOSIS — F3170 Bipolar disorder, currently in remission, most recent episode unspecified: Secondary | ICD-10-CM | POA: Diagnosis not present

## 2023-11-24 DIAGNOSIS — F02A3 Dementia in other diseases classified elsewhere, mild, with mood disturbance: Secondary | ICD-10-CM | POA: Diagnosis not present

## 2023-11-24 DIAGNOSIS — F02A4 Dementia in other diseases classified elsewhere, mild, with anxiety: Secondary | ICD-10-CM | POA: Diagnosis not present

## 2023-11-24 DIAGNOSIS — G8929 Other chronic pain: Secondary | ICD-10-CM | POA: Diagnosis not present

## 2023-11-24 DIAGNOSIS — J4489 Other specified chronic obstructive pulmonary disease: Secondary | ICD-10-CM | POA: Diagnosis not present

## 2023-11-24 DIAGNOSIS — G309 Alzheimer's disease, unspecified: Secondary | ICD-10-CM | POA: Diagnosis not present

## 2023-11-24 DIAGNOSIS — M542 Cervicalgia: Secondary | ICD-10-CM | POA: Diagnosis not present

## 2023-11-24 DIAGNOSIS — I10 Essential (primary) hypertension: Secondary | ICD-10-CM | POA: Diagnosis not present

## 2023-11-24 DIAGNOSIS — G500 Trigeminal neuralgia: Secondary | ICD-10-CM | POA: Diagnosis not present

## 2023-12-01 DIAGNOSIS — G500 Trigeminal neuralgia: Secondary | ICD-10-CM | POA: Diagnosis not present

## 2023-12-01 DIAGNOSIS — F02A4 Dementia in other diseases classified elsewhere, mild, with anxiety: Secondary | ICD-10-CM | POA: Diagnosis not present

## 2023-12-01 DIAGNOSIS — M542 Cervicalgia: Secondary | ICD-10-CM | POA: Diagnosis not present

## 2023-12-01 DIAGNOSIS — J4489 Other specified chronic obstructive pulmonary disease: Secondary | ICD-10-CM | POA: Diagnosis not present

## 2023-12-01 DIAGNOSIS — F02A3 Dementia in other diseases classified elsewhere, mild, with mood disturbance: Secondary | ICD-10-CM | POA: Diagnosis not present

## 2023-12-01 DIAGNOSIS — I10 Essential (primary) hypertension: Secondary | ICD-10-CM | POA: Diagnosis not present

## 2023-12-01 DIAGNOSIS — F3170 Bipolar disorder, currently in remission, most recent episode unspecified: Secondary | ICD-10-CM | POA: Diagnosis not present

## 2023-12-01 DIAGNOSIS — G8929 Other chronic pain: Secondary | ICD-10-CM | POA: Diagnosis not present

## 2023-12-01 DIAGNOSIS — G309 Alzheimer's disease, unspecified: Secondary | ICD-10-CM | POA: Diagnosis not present

## 2023-12-03 DIAGNOSIS — R296 Repeated falls: Secondary | ICD-10-CM | POA: Diagnosis not present

## 2023-12-03 DIAGNOSIS — Z6825 Body mass index (BMI) 25.0-25.9, adult: Secondary | ICD-10-CM | POA: Diagnosis not present

## 2023-12-03 DIAGNOSIS — S12691D Other nondisplaced fracture of seventh cervical vertebra, subsequent encounter for fracture with routine healing: Secondary | ICD-10-CM | POA: Diagnosis not present

## 2023-12-03 DIAGNOSIS — S0003XA Contusion of scalp, initial encounter: Secondary | ICD-10-CM | POA: Diagnosis not present

## 2024-01-14 NOTE — Progress Notes (Unsigned)
 Cardiology Office Note   Date:  01/15/2024  ID:  Gabriel Richardson, Gabriel Richardson [birth year], MRN [id number] PCP: Gabriel Harvey, MD  [facility] Providers Cardiologist:  Gabriel DELENA Merck, MD   History of Present Illness Gabriel Richardson is a 78 y.o. male with a past medical history of heart failure with reduced ejection fraction who presents for cardiology follow-up after hospitalization for myopericarditis.  Was last seen by Gabriel Richardson back in April of this year.  He had been recently hospitalized and diagnosed with myopericarditis.  During his hospital stay experience agitation and hallucinations.  Initially on anticoagulation, but was de-escalated to Plavix  due to frequent falls which occur up to 10 times a month.  He has difficulty getting back to bed or couch after falling.  History of heart failure with reduced ejection fraction and prior left ventricular thrombus.  Echocardiogram showed mildly decreased EF and LAD territory wall motion abnormalities.  He was not a candidate for heart catheterization and did not undergo a cardiac MRI.  Troponin levels were elevated but decreased to 38>>> 519.   No chest pain or breathing issues but significant leg and foot edema particularly on the left foot and ankle which prevented him from wearing socks.  Takes furosemide once a day for fluid management.  Also has a history of alcohol use consuming 2 [place]'s a day which he has reduced to 1.  He is considering changing his POA to his next turnover due to issues with his current POA's availability.  He cannot drive due to spinal problems that limit his neck movement and he uses Gabriel Richardson for transportation.  He stabilizes himself by holding onto furniture but sometimes falls in the bathroom leading to emergency room visits.  Today, he presents for follow-up after recent hospitalization and cardiac catheterization.  He was hospitalized earlier this year for an infection around the heart, with associated  hallucinations and chest pressure. These symptoms have not recurred since hospitalization. An echocardiogram was performed in May, with no repeat study required.  He experiences swelling in his feet and ankles, relieved by elevating his feet. He takes Lasix 40 mg daily, with an option for an additional 20 mg if needed for swelling. His weight ranges from 168 to 174 pounds.  Recent lab work in June showed slightly low sodium levels, normal kidney function, and potassium levels. Blood counts were low, but there is no bleeding from bowels or urine.   Reports no shortness of breath nor dyspnea on exertion. Reports no chest pain, pressure, or tightness. No orthopnea, PND. Reports no palpitations.   Discussed the use of AI scribe software for clinical note transcription with the patient, who gave verbal consent to proceed.  ROS: pertinent ROS in HPI  Studies Reviewed      ECHOCARDIOGRAM   ECHOCARDIOGRAM COMPLETE 07/28/2023   Narrative ECHOCARDIOGRAM REPORT       Patient Name:   Gabriel Richardson Date of Exam: 07/28/2023 Medical Rec #:  [id number]     Height:       70.0 in Accession #:    [id number]    Weight:       176.4 lb Date of Birth:  07/01/1945     BSA:          1.979 m Patient Age:    77 years      BP:           101/75 mmHg Patient Gender: M  HR:           99 bpm. Exam Location:  Inpatient   Procedure: 2D Echo, Color Doppler and Cardiac Doppler (Both Spectral and Color Flow Doppler were utilized during procedure).   Indications:    Syncope R55   History:        Patient has no prior history of Echocardiogram examinations. COPD.   Sonographer:    Gabriel Richardson RDCS Referring Phys: [id number] Gabriel Richardson   IMPRESSIONS     1. Left ventricular ejection fraction, by estimation, is 40 to 45%. The left ventricle has mildly decreased function. The left ventricle demonstrates regional wall motion abnormalities (see scoring diagram/findings for description). Left  ventricular diastolic parameters are indeterminate. 2. Right ventricular systolic function is normal. The right ventricular size is normal. 3. The mitral valve is normal in structure. Trivial mitral valve regurgitation. No evidence of mitral stenosis. 4. The aortic valve was not well visualized. Aortic valve regurgitation is not visualized. No aortic stenosis is present. 5. Aortic dilatation noted. There is borderline dilatation of the aortic root, measuring 39 mm. 6. The inferior vena cava is normal in size with greater than 50% respiratory variability, suggesting right atrial pressure of 3 mmHg.   Conclusion(s)/Recommendation(s): The base to mid ventricle are hyperdynamic. The distal LV and apex are akinetic with prominent laminated LV clot. Findings concerning for underlying mid to distal LAD occlusion.   FINDINGS Left Ventricle: Left ventricular ejection fraction, by estimation, is 40 to 45%. The left ventricle has mildly decreased function. The left ventricle demonstrates regional wall motion abnormalities. The left ventricular internal cavity size was normal in size. There is no left ventricular hypertrophy. Left ventricular diastolic parameters are indeterminate.     LV Wall Scoring: The mid and distal lateral wall, mid and distal anterior septum, entire apex, and mid and distal inferior wall are akinetic.   Right Ventricle: The right ventricular size is normal. No increase in right ventricular wall thickness. Right ventricular systolic function is normal.   Left Atrium: Left atrial size was normal in size.   Right Atrium: Right atrial size was normal in size.   Pericardium: There is no evidence of pericardial effusion.   Mitral Valve: The mitral valve is normal in structure. Trivial mitral valve regurgitation. No evidence of mitral valve stenosis.   Tricuspid Valve: The tricuspid valve is grossly normal. Tricuspid valve regurgitation is trivial. No evidence of tricuspid  stenosis.   Aortic Valve: The aortic valve was not well visualized. Aortic valve regurgitation is not visualized. No aortic stenosis is present.   Pulmonic Valve: The pulmonic valve was not well visualized. Pulmonic valve regurgitation is not visualized. No evidence of pulmonic stenosis.   Aorta: Aortic dilatation noted and the aortic root was not well visualized. There is borderline dilatation of the aortic root, measuring 39 mm.   Venous: The inferior vena cava is normal in size with greater than 50% respiratory variability, suggesting right atrial pressure of 3 mmHg.   IAS/Shunts: No atrial level shunt detected by color flow Doppler.     LEFT VENTRICLE PLAX 2D LVIDd:         4.40 cm LVIDs:         2.80 cm LV PW:         1.00 cm LV IVS:        1.10 cm LVOT diam:     2.20 cm LV SV:         103 LV SV Index:   52  LVOT Area:     3.80 cm     RIGHT VENTRICLE             IVC RV S prime:     20.20 cm/s  IVC diam: 2.00 cm   LEFT ATRIUM         Index LA diam:    2.90 cm 1.47 cm/m AORTIC VALVE LVOT Vmax:   170.00 cm/s LVOT Vmean:  121.000 cm/s LVOT VTI:    0.271 m   AORTA Ao Root diam: 3.90 cm Ao Asc diam:  3.90 cm     SHUNTS Systemic VTI:  0.27 m Systemic Diam: 2.20 cm   Toribio Fuel MD Electronically signed by Toribio Fuel MD Signature Date/Time: 07/28/2023/2:07:01 PM       Final      Physical Exam VS:  BP 114/70   Pulse 63   Ht 5' 10 (1.778 m)   Wt 174 lb 6.4 oz (79.1 kg)   SpO2 97%   BMI 25.02 kg/m        Wt Readings from Last 3 Encounters:  01/15/24 174 lb 6.4 oz (79.1 kg)  09/08/23 185 lb 6.4 oz (84.1 kg)  08/19/23 158 lb (71.7 kg)    GEN: Well nourished, well developed in no acute distress NECK: No JVD; No carotid bruits CARDIAC: RRR, no murmurs, rubs, gallops RESPIRATORY:  Clear to auscultation without rales, wheezing or rhonchi  ABDOMEN: Soft, non-tender, non-distended EXTREMITIES:  1-2+ pitting edema bilaterally; No deformity    ASSESSMENT AND PLAN  Systolic heart failure with peripheral edema Experiencing swelling in feet and ankles. Previous echocardiogram normal. Recent labs showed normal kidney function, low sodium, low hemoglobin without bleeding. On Lasix 40 mg daily, advised additional 20 mg today for swelling. - Advise additional 20 mg Lasix today due to swelling. - Elevate feet to reduce swelling. - Monitor weight, take extra 20 mg Lasix if weight increases by 2 pounds overnight or 5 pounds in a week. -compression socks recommended but patient will not wear them  History of repeated falls Falls reduced to two in August. Considering life alert system for EMS access. - Encourage obtaining life alert system for safety. -uses a walker for ambulation and balance   LV apical thrombus -recent echo reviewed with the patient and no thrombus identified. -continue current medications  Anemia -would follow-up with PCP     Dispo: He can follow-up in 3-4 months with Dr. Santo   Signed, Orren LOISE Fabry, PA-C

## 2024-01-15 ENCOUNTER — Encounter: Payer: Self-pay | Admitting: Physician Assistant

## 2024-01-15 ENCOUNTER — Ambulatory Visit: Attending: Cardiology | Admitting: Physician Assistant

## 2024-01-15 VITALS — BP 114/70 | HR 63 | Ht 70.0 in | Wt 174.4 lb

## 2024-01-15 DIAGNOSIS — I5020 Unspecified systolic (congestive) heart failure: Secondary | ICD-10-CM | POA: Diagnosis present

## 2024-01-15 DIAGNOSIS — R296 Repeated falls: Secondary | ICD-10-CM | POA: Diagnosis not present

## 2024-01-15 DIAGNOSIS — Z7189 Other specified counseling: Secondary | ICD-10-CM | POA: Insufficient documentation

## 2024-01-15 DIAGNOSIS — I513 Intracardiac thrombosis, not elsewhere classified: Secondary | ICD-10-CM | POA: Diagnosis not present

## 2024-01-15 DIAGNOSIS — F1090 Alcohol use, unspecified, uncomplicated: Secondary | ICD-10-CM | POA: Diagnosis present

## 2024-01-15 NOTE — Patient Instructions (Signed)
 Medication Instructions:  For a weight gain of 3 lbs one day or 5 lbs in 1 week, take an additional 20 mg of Lasix daily.  *If you need a refill on your cardiac medications before your next appointment, please call your pharmacy*  Lab Work: None ordered today. If you have labs (blood work) drawn today and your tests are completely normal, you will receive your results only by: MyChart Message (if you have MyChart) OR A paper copy in the mail If you have any lab test that is abnormal or we need to change your treatment, we will call you to review the results.  Testing/Procedures: None ordered today.  Follow-Up: At [facility], you and your health needs are our priority.  As part of our continuing mission to provide you with exceptional heart care, our providers are all part of one team.  This team includes your primary Cardiologist (physician) and Advanced Practice Providers or APPs (Physician Assistants and Nurse Practitioners) who all work together to provide you with the care you need, when you need it.  Your next appointment:   3-4 month(s)  Provider:   Dr. Santo

## 2024-01-28 DIAGNOSIS — S12112K Nondisplaced Type II dens fracture, subsequent encounter for fracture with nonunion: Secondary | ICD-10-CM | POA: Diagnosis not present

## 2024-01-28 DIAGNOSIS — S12112A Nondisplaced Type II dens fracture, initial encounter for closed fracture: Secondary | ICD-10-CM | POA: Diagnosis not present

## 2024-02-16 DIAGNOSIS — I252 Old myocardial infarction: Secondary | ICD-10-CM | POA: Diagnosis not present

## 2024-02-16 DIAGNOSIS — F3170 Bipolar disorder, currently in remission, most recent episode unspecified: Secondary | ICD-10-CM | POA: Diagnosis not present

## 2024-02-16 DIAGNOSIS — M25522 Pain in left elbow: Secondary | ICD-10-CM | POA: Diagnosis not present

## 2024-02-16 DIAGNOSIS — I236 Thrombosis of atrium, auricular appendage, and ventricle as current complications following acute myocardial infarction: Secondary | ICD-10-CM | POA: Diagnosis not present

## 2024-02-16 DIAGNOSIS — S129XXS Fracture of neck, unspecified, sequela: Secondary | ICD-10-CM | POA: Diagnosis not present

## 2024-02-16 DIAGNOSIS — G8929 Other chronic pain: Secondary | ICD-10-CM | POA: Diagnosis not present

## 2024-02-16 DIAGNOSIS — Z23 Encounter for immunization: Secondary | ICD-10-CM | POA: Diagnosis not present

## 2024-02-16 DIAGNOSIS — F1020 Alcohol dependence, uncomplicated: Secondary | ICD-10-CM | POA: Diagnosis not present

## 2024-02-16 DIAGNOSIS — I5020 Unspecified systolic (congestive) heart failure: Secondary | ICD-10-CM | POA: Diagnosis not present

## 2024-02-24 DIAGNOSIS — Z7902 Long term (current) use of antithrombotics/antiplatelets: Secondary | ICD-10-CM | POA: Diagnosis not present

## 2024-02-24 DIAGNOSIS — G709 Myoneural disorder, unspecified: Secondary | ICD-10-CM | POA: Diagnosis not present

## 2024-02-24 DIAGNOSIS — Z91199 Patient's noncompliance with other medical treatment and regimen due to unspecified reason: Secondary | ICD-10-CM | POA: Diagnosis not present

## 2024-02-24 DIAGNOSIS — R2689 Other abnormalities of gait and mobility: Secondary | ICD-10-CM | POA: Diagnosis not present

## 2024-02-24 DIAGNOSIS — F319 Bipolar disorder, unspecified: Secondary | ICD-10-CM | POA: Diagnosis not present

## 2024-02-24 DIAGNOSIS — I509 Heart failure, unspecified: Secondary | ICD-10-CM | POA: Diagnosis not present

## 2024-02-24 DIAGNOSIS — M545 Low back pain, unspecified: Secondary | ICD-10-CM | POA: Diagnosis not present

## 2024-02-24 DIAGNOSIS — M4800 Spinal stenosis, site unspecified: Secondary | ICD-10-CM | POA: Diagnosis not present

## 2024-02-24 DIAGNOSIS — M519 Unspecified thoracic, thoracolumbar and lumbosacral intervertebral disc disorder: Secondary | ICD-10-CM | POA: Diagnosis not present

## 2024-02-24 DIAGNOSIS — G9520 Unspecified cord compression: Secondary | ICD-10-CM | POA: Diagnosis not present

## 2024-02-24 DIAGNOSIS — I2510 Atherosclerotic heart disease of native coronary artery without angina pectoris: Secondary | ICD-10-CM | POA: Diagnosis not present

## 2024-02-24 DIAGNOSIS — G500 Trigeminal neuralgia: Secondary | ICD-10-CM | POA: Diagnosis not present

## 2024-02-24 DIAGNOSIS — F1020 Alcohol dependence, uncomplicated: Secondary | ICD-10-CM | POA: Diagnosis not present

## 2024-02-24 DIAGNOSIS — Z8673 Personal history of transient ischemic attack (TIA), and cerebral infarction without residual deficits: Secondary | ICD-10-CM | POA: Diagnosis not present

## 2024-02-24 DIAGNOSIS — F02A0 Dementia in other diseases classified elsewhere, mild, without behavioral disturbance, psychotic disturbance, mood disturbance, and anxiety: Secondary | ICD-10-CM | POA: Diagnosis not present

## 2024-02-24 DIAGNOSIS — G250 Essential tremor: Secondary | ICD-10-CM | POA: Diagnosis not present

## 2024-02-24 DIAGNOSIS — G309 Alzheimer's disease, unspecified: Secondary | ICD-10-CM | POA: Diagnosis not present

## 2024-02-24 DIAGNOSIS — I110 Hypertensive heart disease with heart failure: Secondary | ICD-10-CM | POA: Diagnosis not present

## 2024-03-08 ENCOUNTER — Other Ambulatory Visit: Payer: Self-pay | Admitting: Neurology

## 2024-03-10 DIAGNOSIS — D692 Other nonthrombocytopenic purpura: Secondary | ICD-10-CM | POA: Diagnosis not present

## 2024-03-10 DIAGNOSIS — M7130 Other bursal cyst, unspecified site: Secondary | ICD-10-CM | POA: Diagnosis not present

## 2024-03-24 DIAGNOSIS — I252 Old myocardial infarction: Secondary | ICD-10-CM | POA: Diagnosis not present

## 2024-03-24 DIAGNOSIS — I236 Thrombosis of atrium, auricular appendage, and ventricle as current complications following acute myocardial infarction: Secondary | ICD-10-CM | POA: Diagnosis not present

## 2024-03-24 DIAGNOSIS — G250 Essential tremor: Secondary | ICD-10-CM | POA: Diagnosis not present

## 2024-03-24 DIAGNOSIS — I5020 Unspecified systolic (congestive) heart failure: Secondary | ICD-10-CM | POA: Diagnosis not present

## 2024-03-24 DIAGNOSIS — F02A18 Dementia in other diseases classified elsewhere, mild, with other behavioral disturbance: Secondary | ICD-10-CM | POA: Diagnosis not present

## 2024-03-24 DIAGNOSIS — M67449 Ganglion, unspecified hand: Secondary | ICD-10-CM | POA: Diagnosis not present

## 2024-03-24 DIAGNOSIS — F3170 Bipolar disorder, currently in remission, most recent episode unspecified: Secondary | ICD-10-CM | POA: Diagnosis not present

## 2024-03-24 DIAGNOSIS — Z79899 Other long term (current) drug therapy: Secondary | ICD-10-CM | POA: Diagnosis not present

## 2024-03-24 DIAGNOSIS — R5383 Other fatigue: Secondary | ICD-10-CM | POA: Diagnosis not present

## 2024-03-31 ENCOUNTER — Observation Stay (HOSPITAL_COMMUNITY)

## 2024-03-31 ENCOUNTER — Inpatient Hospital Stay (HOSPITAL_COMMUNITY)
Admission: EM | Admit: 2024-03-31 | Discharge: 2024-04-03 | DRG: 551 | Disposition: A | Discharge status: Home Health | Attending: Internal Medicine | Admitting: Internal Medicine

## 2024-03-31 ENCOUNTER — Emergency Department (HOSPITAL_COMMUNITY)

## 2024-03-31 ENCOUNTER — Other Ambulatory Visit: Payer: Self-pay

## 2024-03-31 ENCOUNTER — Encounter (HOSPITAL_COMMUNITY): Payer: Self-pay

## 2024-03-31 DIAGNOSIS — S22060A Wedge compression fracture of T7-T8 vertebra, initial encounter for closed fracture: Secondary | ICD-10-CM | POA: Diagnosis not present

## 2024-03-31 DIAGNOSIS — Z9181 History of falling: Secondary | ICD-10-CM | POA: Diagnosis not present

## 2024-03-31 DIAGNOSIS — R296 Repeated falls: Secondary | ICD-10-CM | POA: Diagnosis present

## 2024-03-31 DIAGNOSIS — S065X0A Traumatic subdural hemorrhage without loss of consciousness, initial encounter: Secondary | ICD-10-CM | POA: Diagnosis not present

## 2024-03-31 DIAGNOSIS — Z981 Arthrodesis status: Secondary | ICD-10-CM

## 2024-03-31 DIAGNOSIS — E872 Acidosis, unspecified: Secondary | ICD-10-CM | POA: Diagnosis present

## 2024-03-31 DIAGNOSIS — M19012 Primary osteoarthritis, left shoulder: Secondary | ICD-10-CM | POA: Diagnosis not present

## 2024-03-31 DIAGNOSIS — J4489 Other specified chronic obstructive pulmonary disease: Secondary | ICD-10-CM | POA: Diagnosis present

## 2024-03-31 DIAGNOSIS — D696 Thrombocytopenia, unspecified: Secondary | ICD-10-CM | POA: Diagnosis present

## 2024-03-31 DIAGNOSIS — M25512 Pain in left shoulder: Secondary | ICD-10-CM | POA: Diagnosis not present

## 2024-03-31 DIAGNOSIS — Z833 Family history of diabetes mellitus: Secondary | ICD-10-CM | POA: Diagnosis not present

## 2024-03-31 DIAGNOSIS — D649 Anemia, unspecified: Secondary | ICD-10-CM | POA: Diagnosis present

## 2024-03-31 DIAGNOSIS — S065XAA Traumatic subdural hemorrhage with loss of consciousness status unknown, initial encounter: Secondary | ICD-10-CM | POA: Diagnosis present

## 2024-03-31 DIAGNOSIS — I5042 Chronic combined systolic (congestive) and diastolic (congestive) heart failure: Secondary | ICD-10-CM | POA: Diagnosis present

## 2024-03-31 DIAGNOSIS — Z7902 Long term (current) use of antithrombotics/antiplatelets: Secondary | ICD-10-CM | POA: Diagnosis not present

## 2024-03-31 DIAGNOSIS — F319 Bipolar disorder, unspecified: Secondary | ICD-10-CM | POA: Diagnosis present

## 2024-03-31 DIAGNOSIS — J9811 Atelectasis: Secondary | ICD-10-CM | POA: Diagnosis not present

## 2024-03-31 DIAGNOSIS — R102 Pelvic and perineal pain unspecified side: Secondary | ICD-10-CM | POA: Diagnosis not present

## 2024-03-31 DIAGNOSIS — S22059A Unspecified fracture of T5-T6 vertebra, initial encounter for closed fracture: Secondary | ICD-10-CM | POA: Diagnosis present

## 2024-03-31 DIAGNOSIS — Y900 Blood alcohol level of less than 20 mg/100 ml: Secondary | ICD-10-CM | POA: Diagnosis present

## 2024-03-31 DIAGNOSIS — J439 Emphysema, unspecified: Secondary | ICD-10-CM | POA: Diagnosis present

## 2024-03-31 DIAGNOSIS — F1010 Alcohol abuse, uncomplicated: Secondary | ICD-10-CM | POA: Diagnosis present

## 2024-03-31 DIAGNOSIS — S22050A Wedge compression fracture of T5-T6 vertebra, initial encounter for closed fracture: Secondary | ICD-10-CM | POA: Diagnosis present

## 2024-03-31 DIAGNOSIS — M4316 Spondylolisthesis, lumbar region: Secondary | ICD-10-CM | POA: Diagnosis not present

## 2024-03-31 DIAGNOSIS — Z87891 Personal history of nicotine dependence: Secondary | ICD-10-CM

## 2024-03-31 DIAGNOSIS — D539 Nutritional anemia, unspecified: Secondary | ICD-10-CM | POA: Diagnosis present

## 2024-03-31 DIAGNOSIS — S22019K Unspecified fracture of first thoracic vertebra, subsequent encounter for fracture with nonunion: Secondary | ICD-10-CM | POA: Diagnosis not present

## 2024-03-31 DIAGNOSIS — R9431 Abnormal electrocardiogram [ECG] [EKG]: Secondary | ICD-10-CM | POA: Diagnosis not present

## 2024-03-31 DIAGNOSIS — Y92009 Unspecified place in unspecified non-institutional (private) residence as the place of occurrence of the external cause: Secondary | ICD-10-CM

## 2024-03-31 DIAGNOSIS — Z043 Encounter for examination and observation following other accident: Secondary | ICD-10-CM | POA: Diagnosis not present

## 2024-03-31 DIAGNOSIS — W19XXXA Unspecified fall, initial encounter: Secondary | ICD-10-CM | POA: Diagnosis not present

## 2024-03-31 DIAGNOSIS — S0990XA Unspecified injury of head, initial encounter: Secondary | ICD-10-CM

## 2024-03-31 DIAGNOSIS — Z803 Family history of malignant neoplasm of breast: Secondary | ICD-10-CM | POA: Diagnosis not present

## 2024-03-31 DIAGNOSIS — S12100K Unspecified displaced fracture of second cervical vertebra, subsequent encounter for fracture with nonunion: Secondary | ICD-10-CM | POA: Diagnosis not present

## 2024-03-31 DIAGNOSIS — R0989 Other specified symptoms and signs involving the circulatory and respiratory systems: Secondary | ICD-10-CM | POA: Diagnosis not present

## 2024-03-31 DIAGNOSIS — I5032 Chronic diastolic (congestive) heart failure: Secondary | ICD-10-CM | POA: Diagnosis present

## 2024-03-31 DIAGNOSIS — S299XXA Unspecified injury of thorax, initial encounter: Secondary | ICD-10-CM | POA: Diagnosis not present

## 2024-03-31 DIAGNOSIS — M4802 Spinal stenosis, cervical region: Secondary | ICD-10-CM | POA: Diagnosis present

## 2024-03-31 DIAGNOSIS — N179 Acute kidney failure, unspecified: Secondary | ICD-10-CM | POA: Diagnosis present

## 2024-03-31 DIAGNOSIS — M47816 Spondylosis without myelopathy or radiculopathy, lumbar region: Secondary | ICD-10-CM | POA: Diagnosis not present

## 2024-03-31 DIAGNOSIS — Z818 Family history of other mental and behavioral disorders: Secondary | ICD-10-CM | POA: Diagnosis not present

## 2024-03-31 DIAGNOSIS — M5126 Other intervertebral disc displacement, lumbar region: Secondary | ICD-10-CM | POA: Diagnosis not present

## 2024-03-31 DIAGNOSIS — Z79899 Other long term (current) drug therapy: Secondary | ICD-10-CM

## 2024-03-31 DIAGNOSIS — S22069S Unspecified fracture of T7-T8 vertebra, sequela: Secondary | ICD-10-CM

## 2024-03-31 DIAGNOSIS — D72829 Elevated white blood cell count, unspecified: Secondary | ICD-10-CM | POA: Diagnosis present

## 2024-03-31 DIAGNOSIS — S22059D Unspecified fracture of T5-T6 vertebra, subsequent encounter for fracture with routine healing: Secondary | ICD-10-CM | POA: Diagnosis not present

## 2024-03-31 DIAGNOSIS — F419 Anxiety disorder, unspecified: Secondary | ICD-10-CM | POA: Diagnosis present

## 2024-03-31 DIAGNOSIS — S3991XA Unspecified injury of abdomen, initial encounter: Secondary | ICD-10-CM | POA: Diagnosis not present

## 2024-03-31 DIAGNOSIS — S12000K Unspecified displaced fracture of first cervical vertebra, subsequent encounter for fracture with nonunion: Secondary | ICD-10-CM | POA: Diagnosis not present

## 2024-03-31 DIAGNOSIS — R55 Syncope and collapse: Secondary | ICD-10-CM | POA: Diagnosis not present

## 2024-03-31 DIAGNOSIS — R569 Unspecified convulsions: Secondary | ICD-10-CM | POA: Diagnosis not present

## 2024-03-31 DIAGNOSIS — R251 Tremor, unspecified: Secondary | ICD-10-CM | POA: Diagnosis present

## 2024-03-31 DIAGNOSIS — Z602 Problems related to living alone: Secondary | ICD-10-CM | POA: Diagnosis present

## 2024-03-31 DIAGNOSIS — W1830XA Fall on same level, unspecified, initial encounter: Secondary | ICD-10-CM | POA: Diagnosis present

## 2024-03-31 DIAGNOSIS — Z8709 Personal history of other diseases of the respiratory system: Secondary | ICD-10-CM | POA: Diagnosis not present

## 2024-03-31 DIAGNOSIS — G500 Trigeminal neuralgia: Secondary | ICD-10-CM | POA: Diagnosis present

## 2024-03-31 DIAGNOSIS — Z8419 Family history of other disorders of kidney and ureter: Secondary | ICD-10-CM

## 2024-03-31 DIAGNOSIS — S3993XA Unspecified injury of pelvis, initial encounter: Secondary | ICD-10-CM | POA: Diagnosis not present

## 2024-03-31 LAB — TROPONIN I (HIGH SENSITIVITY)
Troponin I (High Sensitivity): 5 ng/L (ref <18)
Troponin I (High Sensitivity): 6 ng/L (ref <18)

## 2024-03-31 LAB — I-STAT CHEM 8, ED
BUN: 23 mg/dL (ref 8–23)
Calcium, Ion: 1.05 mmol/L — ABNORMAL LOW (ref 1.15–1.40)
Chloride: 101 mmol/L (ref 98–111)
Creatinine, Ser: 1.3 mg/dL — ABNORMAL HIGH (ref 0.61–1.24)
Glucose, Bld: 102 mg/dL — ABNORMAL HIGH (ref 70–99)
HCT: 38 % — ABNORMAL LOW (ref 39.0–52.0)
Hemoglobin: 12.9 g/dL — ABNORMAL LOW (ref 13.0–17.0)
Potassium: 4.7 mmol/L (ref 3.5–5.1)
Sodium: 137 mmol/L (ref 135–145)
TCO2: 24 mmol/L (ref 22–32)

## 2024-03-31 LAB — CBC WITH DIFFERENTIAL/PLATELET
Abs Immature Granulocytes: 0.12 K/uL — ABNORMAL HIGH (ref 0.00–0.07)
Basophils Absolute: 0.1 K/uL (ref 0.0–0.1)
Basophils Relative: 0 %
Eosinophils Absolute: 0 K/uL (ref 0.0–0.5)
Eosinophils Relative: 0 %
HCT: 35.5 % — ABNORMAL LOW (ref 39.0–52.0)
Hemoglobin: 12 g/dL — ABNORMAL LOW (ref 13.0–17.0)
Immature Granulocytes: 1 %
Lymphocytes Relative: 13 %
Lymphs Abs: 1.7 K/uL (ref 0.7–4.0)
MCH: 35.3 pg — ABNORMAL HIGH (ref 26.0–34.0)
MCHC: 33.8 g/dL (ref 30.0–36.0)
MCV: 104.4 fL — ABNORMAL HIGH (ref 80.0–100.0)
Monocytes Absolute: 0.9 K/uL (ref 0.1–1.0)
Monocytes Relative: 7 %
Neutro Abs: 10.6 K/uL — ABNORMAL HIGH (ref 1.7–7.7)
Neutrophils Relative %: 79 %
Platelets: 217 K/uL (ref 150–400)
RBC: 3.4 MIL/uL — ABNORMAL LOW (ref 4.22–5.81)
RDW: 13.2 % (ref 11.5–15.5)
WBC: 13.4 K/uL — ABNORMAL HIGH (ref 4.0–10.5)
nRBC: 0 % (ref 0.0–0.2)

## 2024-03-31 LAB — SAMPLE TO BLOOD BANK

## 2024-03-31 LAB — COMPREHENSIVE METABOLIC PANEL WITH GFR
ALT: 21 U/L (ref 0–44)
AST: 31 U/L (ref 15–41)
Albumin: 3.7 g/dL (ref 3.5–5.0)
Alkaline Phosphatase: 99 U/L (ref 38–126)
Anion gap: 18 — ABNORMAL HIGH (ref 5–15)
BUN: 21 mg/dL (ref 8–23)
CO2: 22 mmol/L (ref 22–32)
Calcium: 8.7 mg/dL — ABNORMAL LOW (ref 8.9–10.3)
Chloride: 98 mmol/L (ref 98–111)
Creatinine, Ser: 1.19 mg/dL (ref 0.61–1.24)
GFR, Estimated: >60 mL/min (ref >60)
Glucose, Bld: 103 mg/dL — ABNORMAL HIGH (ref 70–99)
Potassium: 4.8 mmol/L (ref 3.5–5.1)
Sodium: 138 mmol/L (ref 135–145)
Total Bilirubin: 1.3 mg/dL — ABNORMAL HIGH (ref 0.0–1.2)
Total Protein: 7.5 g/dL (ref 6.5–8.1)

## 2024-03-31 LAB — I-STAT CG4 LACTIC ACID, ED
Lactic Acid, Venous: 3.6 mmol/L (ref 0.5–1.9)
Lactic Acid, Venous: 1.9 mmol/L (ref 0.5–1.9)

## 2024-03-31 LAB — PROTIME-INR
INR: 1.2 (ref 0.8–1.2)
Prothrombin Time: 16 s — ABNORMAL HIGH (ref 11.4–15.2)

## 2024-03-31 LAB — ETHANOL: Alcohol, Ethyl (B): 19 mg/dL — ABNORMAL HIGH (ref <15)

## 2024-03-31 LAB — VITAMIN B12: Vitamin B-12: 393 pg/mL (ref 180–914)

## 2024-03-31 LAB — FOLATE: Folate: >20 ng/mL (ref >5.9)

## 2024-03-31 LAB — CK: Total CK: 70 U/L (ref 49–397)

## 2024-03-31 LAB — MAGNESIUM: Magnesium: 1.9 mg/dL (ref 1.7–2.4)

## 2024-03-31 MED ORDER — MEMANTINE HCL 10 MG PO TABS
10.0000 mg | ORAL_TABLET | Freq: Two times a day (BID) | ORAL | Status: DC
  Administered 2024-04-01 – 2024-04-03 (×5): 10 mg via ORAL
  Filled 2024-03-31 (×5): qty 1

## 2024-03-31 MED ORDER — MORPHINE SULFATE (PF) 4 MG/ML IV SOLN
4.0000 mg | Freq: Once | INTRAVENOUS | Status: AC
  Administered 2024-03-31: 4 mg via INTRAVENOUS
  Filled 2024-03-31: qty 1

## 2024-03-31 MED ORDER — DOCUSATE SODIUM 100 MG PO CAPS
100.0000 mg | ORAL_CAPSULE | Freq: Two times a day (BID) | ORAL | Status: DC
  Administered 2024-04-01 – 2024-04-03 (×5): 100 mg via ORAL
  Filled 2024-03-31 (×5): qty 1

## 2024-03-31 MED ORDER — LACTATED RINGERS IV SOLN: INTRAVENOUS | Status: AC

## 2024-03-31 MED ORDER — FENTANYL CITRATE (PF) 50 MCG/ML IJ SOSY
50.0000 ug | PREFILLED_SYRINGE | Freq: Once | INTRAMUSCULAR | Status: AC
  Administered 2024-03-31: 50 ug via INTRAVENOUS
  Filled 2024-03-31: qty 1

## 2024-03-31 MED ORDER — PREGABALIN 100 MG PO CAPS
100.0000 mg | ORAL_CAPSULE | Freq: Two times a day (BID) | ORAL | Status: DC
  Administered 2024-04-01 – 2024-04-03 (×5): 100 mg via ORAL
  Filled 2024-03-31 (×5): qty 1

## 2024-03-31 MED ORDER — ONDANSETRON HCL 4 MG/2ML IJ SOLN
4.0000 mg | Freq: Once | INTRAMUSCULAR | Status: AC
  Administered 2024-03-31: 4 mg via INTRAVENOUS
  Filled 2024-03-31: qty 2

## 2024-03-31 MED ORDER — THIAMINE MONONITRATE 100 MG PO TABS
100.0000 mg | ORAL_TABLET | Freq: Every day | ORAL | Status: DC
  Administered 2024-03-31 – 2024-04-03 (×4): 100 mg via ORAL
  Filled 2024-03-31 (×4): qty 1

## 2024-03-31 MED ORDER — POLYETHYLENE GLYCOL 3350 17 G PO PACK: 17.0000 g | PACK | Freq: Every day | ORAL | Status: DC | PRN

## 2024-03-31 MED ORDER — LORAZEPAM 1 MG PO TABS
1.0000 mg | ORAL_TABLET | ORAL | Status: DC | PRN
  Administered 2024-04-02: 2 mg via ORAL
  Filled 2024-03-31: qty 2
  Filled 2024-03-31: qty 4

## 2024-03-31 MED ORDER — FENTANYL CITRATE (PF) 50 MCG/ML IJ SOSY
PREFILLED_SYRINGE | INTRAMUSCULAR | Status: AC
  Filled 2024-03-31: qty 1

## 2024-03-31 MED ORDER — OXYCODONE-ACETAMINOPHEN 5-325 MG PO TABS
1.0000 | ORAL_TABLET | Freq: Once | ORAL | Status: AC
  Administered 2024-03-31: 1 via ORAL
  Filled 2024-03-31: qty 1

## 2024-03-31 MED ORDER — FENTANYL CITRATE (PF) 50 MCG/ML IJ SOSY: 50.0000 ug | PREFILLED_SYRINGE | Freq: Once | INTRAMUSCULAR | Status: DC

## 2024-03-31 MED ORDER — HYDROCODONE-ACETAMINOPHEN 5-325 MG PO TABS
1.0000 | ORAL_TABLET | Freq: Once | ORAL | Status: DC
  Filled 2024-03-31: qty 1

## 2024-03-31 MED ORDER — LAMOTRIGINE 100 MG PO TABS
200.0000 mg | ORAL_TABLET | Freq: Every day | ORAL | Status: DC
  Administered 2024-03-31 – 2024-04-02 (×3): 200 mg via ORAL
  Filled 2024-03-31 (×3): qty 2

## 2024-03-31 MED ORDER — IOHEXOL 350 MG/ML SOLN
75.0000 mL | Freq: Once | INTRAVENOUS | Status: AC | PRN
  Administered 2024-03-31: 75 mL via INTRAVENOUS

## 2024-03-31 MED ORDER — ONDANSETRON HCL 4 MG/2ML IJ SOLN: 4.0000 mg | Freq: Four times a day (QID) | INTRAMUSCULAR | Status: DC | PRN

## 2024-03-31 MED ORDER — FOLIC ACID 1 MG PO TABS
1.0000 mg | ORAL_TABLET | Freq: Every day | ORAL | Status: DC
  Administered 2024-04-01 – 2024-04-03 (×3): 1 mg via ORAL
  Filled 2024-03-31 (×3): qty 1

## 2024-03-31 MED ORDER — FENTANYL CITRATE (PF) 50 MCG/ML IJ SOSY
PREFILLED_SYRINGE | INTRAMUSCULAR | Status: AC | PRN
  Administered 2024-03-31: 50 ug via INTRAVENOUS

## 2024-03-31 MED ORDER — ACETAMINOPHEN 325 MG PO TABS
650.0000 mg | ORAL_TABLET | Freq: Four times a day (QID) | ORAL | Status: DC | PRN
  Filled 2024-03-31: qty 2

## 2024-03-31 MED ORDER — FENTANYL CITRATE (PF) 50 MCG/ML IJ SOSY
50.0000 ug | PREFILLED_SYRINGE | INTRAMUSCULAR | Status: DC | PRN
  Administered 2024-04-01 – 2024-04-02 (×4): 50 ug via INTRAVENOUS
  Filled 2024-03-31 (×5): qty 1

## 2024-03-31 MED ORDER — SODIUM CHLORIDE 0.9 % IV BOLUS
1000.0000 mL | Freq: Once | INTRAVENOUS | Status: AC
  Administered 2024-03-31: 1000 mL via INTRAVENOUS

## 2024-03-31 MED ORDER — LABETALOL HCL 5 MG/ML IV SOLN: 5.0000 mg | INTRAVENOUS | Status: DC | PRN

## 2024-03-31 MED ORDER — ALBUTEROL SULFATE (2.5 MG/3ML) 0.083% IN NEBU
2.5000 mg | INHALATION_SOLUTION | RESPIRATORY_TRACT | Status: DC | PRN
  Administered 2024-04-02: 2.5 mg via RESPIRATORY_TRACT
  Filled 2024-03-31: qty 3

## 2024-03-31 MED ORDER — LORAZEPAM 2 MG/ML IJ SOLN: 1.0000 mg | INTRAMUSCULAR | Status: DC | PRN

## 2024-03-31 MED ORDER — PANTOPRAZOLE SODIUM 40 MG IV SOLR
40.0000 mg | Freq: Two times a day (BID) | INTRAVENOUS | Status: DC
  Administered 2024-03-31 – 2024-04-03 (×6): 40 mg via INTRAVENOUS
  Filled 2024-03-31 (×6): qty 10

## 2024-03-31 MED ORDER — LIDOCAINE 5 % EX PTCH
1.0000 | MEDICATED_PATCH | CUTANEOUS | Status: DC
  Administered 2024-04-02: 1 via TRANSDERMAL
  Filled 2024-03-31 (×2): qty 1

## 2024-03-31 MED ORDER — PRIMIDONE 50 MG PO TABS
100.0000 mg | ORAL_TABLET | Freq: Two times a day (BID) | ORAL | Status: DC
  Administered 2024-03-31 – 2024-04-03 (×6): 100 mg via ORAL
  Filled 2024-03-31 (×6): qty 2

## 2024-03-31 MED ORDER — THIAMINE HCL 100 MG/ML IJ SOLN: 100.0000 mg | Freq: Every day | INTRAMUSCULAR | Status: DC

## 2024-03-31 MED ORDER — ACETAMINOPHEN 650 MG RE SUPP: 650.0000 mg | Freq: Four times a day (QID) | RECTAL | Status: DC | PRN

## 2024-03-31 MED ORDER — MELATONIN 3 MG PO TABS
3.0000 mg | ORAL_TABLET | Freq: Every evening | ORAL | Status: DC | PRN
  Filled 2024-03-31: qty 1

## 2024-03-31 MED ORDER — NALOXONE HCL 0.4 MG/ML IJ SOLN: 0.4000 mg | INTRAMUSCULAR | Status: DC | PRN

## 2024-03-31 MED ORDER — ADULT MULTIVITAMIN W/MINERALS CH
1.0000 | ORAL_TABLET | Freq: Every day | ORAL | Status: DC
  Administered 2024-04-01 – 2024-04-03 (×3): 1 via ORAL
  Filled 2024-03-31 (×3): qty 1

## 2024-03-31 NOTE — H&P (Signed)
 History and Physical      Gabriel Richardson FMW:[id number] DOB: 1945/10/11 DOA: 03/31/2024; DOS: 03/31/2024  PCP: Aisha Harvey, MD  Patient coming from: home   I have personally briefly reviewed patient's old medical records in [facility]  Chief Complaint: back pain  HPI: Gabriel Richardson is a 78 y.o. male with medical history significant for chronic alcohol abuse, recurrent falls, chronic anemia with baseline hemoglobin 10-13, who is admitted to [facility] on 03/31/2024 with acute T6 vertebral fracture after presenting from home to [facility] ED complaining of back pain.   The patient reports that he tripped while ambulating on the evening of 03/30/2024 resulting in a ground-level mechanical fall to the floor in which he struck his head as a component of this fall.  Denies any associated loss of consciousness or any associated acute focal weakness or any acute focal numbness, paresthesias, vertigo, facial droop, aphasia.  However, after this fall, he notes worsening mid back discomfort along the midline of the back.   He is on daily Plavix , but otherwise on no additional antiplatelet medications, nor any anticoagulant medications.  It appears that Plavix  was started in March 2025 in the setting of MI at that time.   Denies any recent subjective fever, chills or rigors, or generalized myalgias.  No recent chest pain, shortness of breath, palpitations, diaphoresis.     ED Course:  Vital signs in the ED were notable for the following: Afebrile; heart rates in the 70s to 80s; systolic blood pressures in the mid 90s to 130s; with most recent blood pressure noted to be 110/62; respiratory rate 15-23, oxygen saturation 95 to 100% on room air.  Labs were notable for the following: CMP was notable for the following: Sodium 138, creatinine 1.19, glucose 103, calcium adjusted for mild hypoalbuminemia noted 8.9, avidin 3.7, total bilirubin 1.3.  Otherwise, liver enzymes are within normal limits.   High sensitive troponin initially was 5, with repeat value trending up slightly to 6.  CPK 70.  Serum ethanol level 19.  Initial lactate 3.6, with repeat trending down to 1.9.  CBC notable for open cell count 13,400, hemoglobin 12.0 specific macrocytic and normochromic qualities, and relative to most recent prior hemoglobin data point of 10.5 on 10/23/2023 completely count 217.  INR 1.2.  Urinalysis has been ordered, with result currently pending.  Per my interpretation, EKG in ED demonstrated the following: EKG shows sinus rhythm with first-degree AV block, PR interval 124, left bundle branch block, heart rate 78, and no evidence of T wave or ST changes, including no evidence of STEMI.  Imaging in the ED, per corresponding formal radiology read, was notable for the following: Plain Right Shoulder Showed No Evidence of Acute Process.  Plain Films of the Pelvis Showed Nones of Acute Process.  1 View Chest X-Ray Showed Mild Right Basilar Atelectasis, without Any Evidence of Acute Cardiopulmonary Process, Including No Evidence of Allergic Edema, Effusion, or Pneumothorax.  CT Head, Relative to CT Head from 10/23/2023, Showed New 0.8 Cm hypoattenuating left cerebral convexity extra-axial fluid collection, suggestive of subdural hematoma, possibly acute, without significant mass effect or evidence of midline shift.  CT cervical spine showed no evidence of acute cervical spine fracture subluxation injury.  Plain films of the L-spine, in comparison to corresponding plain films performed on 03/18/2023 showed age-indeterminate moderate superior endplate deformities at L4/L5.  CT chest, abdomen, pelvis with contrast showed acute fracture of the anterior aspect of T6 vertebra extending from the anterior  vertebral body cortex into the inferior endplate, without any corresponding evidence of retropulsion, will also showing age-indeterminate fracture of the anterior aspect of T8 as well as chronic appearing compression fractures  of L4/L5 with approximately 50% loss of vertebral body height, while showing no evidence of acute cardiopulmonary process, including no evidence of infiltrate edema, effusion, or pneumothorax and evidence of acute intra-abdominal or acute intrapelvic process.  EDP discussed with on-call neurosurgery, Dr. Darnella, who will consult and conveyed the following recommendations: regarding patient's acute T6 fx, Dr. Darnella recommends TLSO brace and outpatient follow-up in neurosurgery clinic in 6 [place]. Regarding subdural hematoma, Dr. Darnella recommends repeat CT head in 6 hours following initial CT head, holding Plavix  until 11/17 (may restart on 11/17), and goal SBP < 140 mmHg.   While in the ED, the following were administered: Fentanyl  50 mcg IV x 1 dose, morphine 4 mg IV x 2 doses, Percocet 09/2023 mg p.o. x 1 dose, Zofran  4 mg IV x 1 dose, normal saline x 1 L bolus.  Subsequently, the patient was admitted for overnight observation for pain control relating to acute T6 vertebral fracture.     Review of Systems: As per HPI otherwise 10 point review of systems negative.   Past Medical History:  Diagnosis Date   Ankle fracture    right with syndesmotic disruption   Anxiety    Arthritis    Asthma    Bipolar disorder (HCC)    Cervical stenosis of spinal canal    Closed fracture of right distal fibula 01/07/2019   COPD (chronic obstructive pulmonary disease) (HCC)    Depression    Emphysema of lung (HCC)    Neuromuscular disorder (HCC)    Trigeminal neuralgia    Wears glasses     Past Surgical History:  Procedure Laterality Date   CERVICAL FUSION     COLONOSCOPY  10 years ago    At [facility] Medical-normal exam   LUMBAR LAMINECTOMY     ORIF ANKLE FRACTURE Right 01/07/2019   Procedure: Right ankle open reduction internal fixation with syndesmosis fixation;  Surgeon: Sharl Selinda Dover, MD;  Location: [facility] OR;  Service: Orthopedics;  Laterality: Right;  90 mins   SPINE SURGERY     Cervical  C3-C5 anterior fusion   THORACIC DISCECTOMY N/A 08/08/2020   Procedure: Thoracic two- thoracic three Laminectomy;  Surgeon: Unice Pac, MD;  Location: [facility] OR;  Service: Neurosurgery;  Laterality: N/A;    Social History:  reports that he quit smoking about 26 years ago. His smoking use included cigarettes. He started smoking about 51 years ago. He has a 37.5 pack-year smoking history. He has never used smokeless tobacco. He reports current alcohol use of about 2.0 standard drinks of alcohol per week. He reports that he does not use drugs.   No Known Allergies  Family History  Problem Relation Age of Onset   Kidney disease Mother    Breast cancer Mother        Living, 31   Diabetes Mellitus II Father        Deceased, 18   Tremor Father    Depression Daughter    Healthy Brother    Colon cancer Neg Hx     Family history reviewed and not pertinent    Prior to Admission medications   Medication Sig Start Date End Date Taking? Authorizing Provider  acetaminophen  (TYLENOL ) 500 MG tablet Take 1,000 mg by mouth every 6 (six) hours as needed for mild pain or headache.  [provider]  clopidogrel  (PLAVIX ) 75 MG tablet Take 1 tablet (75 mg total) by mouth daily. 07/30/23   Arlon Carliss ORN, DO  furosemide (LASIX) 40 MG tablet Take 20-40 mg by mouth See admin instructions. Take 40 mg by mouth once a day and an additional 20 mg as needed for leg swelling 06/02/23   [provider]  lamoTRIgine  (LAMICTAL ) 200 MG tablet Take 200 mg by mouth at bedtime. 01/18/15   [provider]  memantine  (NAMENDA ) 10 MG tablet TAKE 1 TABLET BY MOUTH 2 TIMES A DAY 08/25/23   Patel, Donika K, DO  pregabalin  (LYRICA ) 100 MG capsule TAKE 1 CAPSULE BY MOUTH 2 TIMES A DAY 03/09/24   Jaffe, Adam R, DO  primidone  (MYSOLINE ) 50 MG tablet TAKE 2 TABLETS BY MOUTH TWICE A DAY 09/22/23   Patel, Donika K, DO  propranolol  ER (INDERAL  LA) 120 MG 24 hr capsule TAKE 1 CAPSULE BY MOUTH 2 TIMES A DAY OK TO  TAKE EXTRA CAPSULE AS NEEDED FOR WORSENING TREMOR 08/01/23   Patel, Donika K, DO  spironolactone  (ALDACTONE ) 25 MG tablet Take 0.5 tablets (12.5 mg total) by mouth daily. 09/08/23   Santo Stanly LABOR, MD  zolpidem  (AMBIEN  CR) 12.5 MG CR tablet Take 12.5 mg by mouth daily at 6 (six) AM.    [provider]     Objective    Physical Exam: Vitals:   03/31/24 1800 03/31/24 1900 03/31/24 1923 03/31/24 1942  BP: 106/61 114/62  110/62  Pulse: 78 80  79  Resp: 16 12  17   Temp:   (!) 97.4 F (36.3 C)   TempSrc:   Oral   SpO2: 100% 95%  98%  Weight:      Height:        General: appears to be stated age; alert, oriented Skin: warm, dry, no rash Head:  AT/[place] Mouth:  Oral mucosa membranes appear moist, normal dentition Neck: supple; trachea midline Heart:  RRR; did not appreciate any M/R/G Lungs: CTAB, did not appreciate any wheezes, rales, or rhonchi Abdomen: + BS; soft, ND, NT Vascular: 2+ pedal pulses b/l; 2+ radial pulses b/l Extremities: no peripheral edema, no muscle wasting           Labs on Admission: I have personally reviewed following labs and imaging studies  CBC: Recent Labs  Lab 03/31/24 1308 03/31/24 1315  WBC 13.4*  --   NEUTROABS 10.6*  --   HGB 12.0* 12.9*  HCT 35.5* 38.0*  MCV 104.4*  --   PLT 217  --    Basic Metabolic Panel: Recent Labs  Lab 03/31/24 1308 03/31/24 1315  NA 138 137  K 4.8 4.7  CL 98 101  CO2 22  --   GLUCOSE 103* 102*  BUN 21 23  CREATININE 1.19 1.30*  CALCIUM 8.7*  --    GFR: Estimated Creatinine Clearance: 49.9 mL/min (A) (by C-G formula based on SCr of 1.3 mg/dL (H)). Liver Function Tests: Recent Labs  Lab 03/31/24 1308  AST 31  ALT 21  ALKPHOS 99  BILITOT 1.3*  PROT 7.5  ALBUMIN 3.7   No results for input(s): LIPASE, AMYLASE in the last 168 hours. No results for input(s): AMMONIA in the last 168 hours. Coagulation Profile: Recent Labs  Lab 03/31/24 1308  INR 1.2   Cardiac  Enzymes: Recent Labs  Lab 03/31/24 1656  CKTOTAL 70   BNP (last 3 results) No results for input(s): PROBNP in the last 8760 hours. HbA1C: No  results for input(s): HGBA1C in the last 72 hours. CBG: No results for input(s): GLUCAP in the last 168 hours. Lipid Profile: No results for input(s): CHOL, HDL, LDLCALC, TRIG, CHOLHDL, LDLDIRECT in the last 72 hours. Thyroid  Function Tests: No results for input(s): TSH, T4TOTAL, FREET4, T3FREE, THYROIDAB in the last 72 hours. Anemia Panel: No results for input(s): VITAMINB12, FOLATE, FERRITIN, TIBC, IRON, RETICCTPCT in the last 72 hours. Urine analysis:    Component Value Date/Time   COLORURINE YELLOW 07/27/2023 1338   APPEARANCEUR CLEAR 07/27/2023 1338   LABSPEC 1.018 07/27/2023 1338   PHURINE 6.0 07/27/2023 1338   GLUCOSEU NEGATIVE 07/27/2023 1338   HGBUR NEGATIVE 07/27/2023 1338   BILIRUBINUR NEGATIVE 07/27/2023 1338   BILIRUBINUR neg 08/13/2013 1247   KETONESUR 80 (A) 07/27/2023 1338   PROTEINUR NEGATIVE 07/27/2023 1338   UROBILINOGEN 1.0 08/13/2013 1247   UROBILINOGEN 1.0 01/09/2009 0902   NITRITE NEGATIVE 07/27/2023 1338   LEUKOCYTESUR NEGATIVE 07/27/2023 1338    Radiological Exams on Admission: CT CHEST ABDOMEN PELVIS W CONTRAST Result Date: 03/31/2024 CLINICAL DATA:  Trauma. EXAM: CT CHEST, ABDOMEN, AND PELVIS WITH CONTRAST TECHNIQUE: Multidetector CT imaging of the chest, abdomen and pelvis was performed following the standard protocol during bolus administration of intravenous contrast. RADIATION DOSE REDUCTION: This exam was performed according to the departmental dose-optimization program which includes automated exposure control, adjustment of the mA and/or kV according to patient size and/or use of iterative reconstruction technique. CONTRAST:  75mL OMNIPAQUE  IOHEXOL  350 MG/ML SOLN COMPARISON:  None Available. FINDINGS: CT CHEST FINDINGS Cardiovascular: There is no cardiomegaly or  pericardial effusion. Mild atherosclerotic calcification of the thoracic aorta. No aneurysmal dilatation or dissection. The origins of the great vessels of the aortic arch and the central pulmonary arteries appear patent as visualized. Mediastinum/Nodes: No hilar or mediastinal adenopathy. Mild thickened appearance of the mid esophagus with infiltration of the surrounding fat plane may represent esophagitis. Underlying esophageal neoplasm is not excluded. Esophagram or endoscopy may provide better evaluation on a nonemergent/outpatient basis. No mediastinal fluid collection. Lungs/Pleura: No focal consolidation, pleural effusion, pneumothorax. The central airways are patent. Musculoskeletal: Osteopenia with severe degenerative changes of the spine. Acute fracture of the anterior T6 vertebra extending from the anterior vertebral body cortex into the inferior endplate. There is also an age indeterminate fracture extending from the anterior T8 osteophyte into the T8 vertebra. Multiple old healed bilateral rib fractures. CT ABDOMEN PELVIS FINDINGS No intra-abdominal free air or free fluid. Hepatobiliary: Fatty liver with morphologic changes of cirrhosis. No biliary dilatation. Small gallstone. No pericholecystic fluid or evidence of acute cholecystitis by CT. Pancreas: Unremarkable. No pancreatic ductal dilatation or surrounding inflammatory changes. Spleen: Normal in size without focal abnormality. Adrenals/Urinary Tract: The adrenal glands are unremarkable. There is no hydronephrosis on either side. The visualized ureters and urinary bladder probable. Stomach/Bowel: There is sigmoid diverticulosis. There is no bowel obstruction or active inflammation. The appendix is normal. Vascular/Lymphatic: No adenopathy. Reproductive: The prostate and seminal vesicles are grossly remarkable. Other: None Musculoskeletal: Osteopenia with severe degenerative changes of spine. Chronic appearing compression fractures of L4 and L5 with  approximately 50% loss of vertebral body height. Correlation with clinical exam and point tenderness recommended. IMPRESSION: 1. Acute fracture of the anterior T6 vertebra extending from the anterior vertebral body cortex into the inferior endplate. No retropulsion. 2. Age indeterminate fracture extending from the anterior T8 osteophyte into the T8 vertebra. 3. Chronic appearing compression fractures of L4 and L5 with approximately 50% loss of vertebral body height. Correlation  with clinical exam and point tenderness recommended. 4. No acute/traumatic intra-abdominal or pelvic pathology. 5. Fatty liver with morphologic changes of cirrhosis. 6. Cholelithiasis. 7. Sigmoid diverticulosis. 8. Mild thickened appearance of the mid esophagus with infiltration of the surrounding fat plane may represent esophagitis. Underlying esophageal neoplasm is not excluded. Esophagram or endoscopy may provide better evaluation on a nonemergent/outpatient basis. 9.  Aortic Atherosclerosis (ICD10-I70.0). Electronically Signed   By: Vanetta Chou M.D.   On: 03/31/2024 17:41   DG Lumbar Spine Complete Result Date: 03/31/2024 CLINICAL DATA:  Fall EXAM: LUMBAR SPINE - COMPLETE 4+ VIEW COMPARISON:  03/18/2023 FINDINGS: Stable lumbar alignment with grade 1 anterolisthesis L5 on S1 and trace retrolisthesis L1 on L2 and L2 on L3. Mild chronic superior endplate deformity at L2. Interval age indeterminate moderate superior endplate deformities at L4 and L5. Moderate disc space narrowing and degenerative change at L5-S1 and at L1-L2 and L2-L3. Multilevel facet degenerative changes. Aortic atherosclerosis IMPRESSION: 1. Interval age indeterminate moderate superior endplate deformities at L4 and L5. 2. Chronic mild superior endplate deformity at L2. 3. Multilevel degenerative changes. Electronically Signed   By: Luke Bun M.D.   On: 03/31/2024 16:05   CT Head Wo Contrast Result Date: 03/31/2024 EXAM: CT HEAD WITHOUT CONTRAST 03/31/2024  02:10:00 PM TECHNIQUE: CT of the head was performed without the administration of intravenous contrast. Automated exposure control, iterative reconstruction, and/or weight based adjustment of the mA/kV was utilized to reduce the radiation dose to as low as reasonably achievable. COMPARISON: CT head 10/23/2023. CLINICAL HISTORY: Head trauma, minor (Age >= 65y). FINDINGS: BRAIN AND VENTRICLES: No evidence of acute infarct. No hydrocephalus. Overall similar mild parenchymal volume loss advanced for the patient's stated age. New 0.8 cm predominantly hypoattenuating left cerebral convexity extra axial fluid collection without significant mass effect or midline shift. Overall similar moderate-to-severe scattered white matter hypodensities which are nonspecific but most commonly represent chronic microvascular ischemic changes. Redemonstrated chronic left occipital lobe infarction. ORBITS: Bilateral lens replacement. SINUSES: No acute abnormality. SOFT TISSUES AND SKULL: Decreased left parietal hematoma and soft tissue swelling. Right temporomandibular joint arthrosis. Partially imaged chronic cervical spine fractures. No skull fracture. IMPRESSION: 1. Since 10/23/2023, new 0.8 cm hypoattenuating left cerebral convexity extra-axial fluid collection, without significant mass effect or midline shift, likely representing subdural hematoma, possibly acute. 2. The above finding was communicated to [facility] by phone at 3:25 PM. Electronically signed by: andrew bybordi 03/31/2024 03:27 PM EST RP Workstation: [id number]   CT Cervical Spine Wo Contrast Result Date: 03/31/2024 CLINICAL DATA:  Fall. EXAM: CT CERVICAL SPINE WITHOUT CONTRAST TECHNIQUE: Multidetector CT imaging of the cervical spine was performed without intravenous contrast. Multiplanar CT image reconstructions were also generated. RADIATION DOSE REDUCTION: This exam was performed according to the departmental dose-optimization program which includes  automated exposure control, adjustment of the mA and/or kV according to patient size and/or use of iterative reconstruction technique. COMPARISON:  10/23/2023 FINDINGS: Alignment: No acute posttraumatic subluxation. Skull base and vertebrae: Moderate spondylosis throughout the cervical spine to include uncovertebral joint spurring and facet arthropathy. Exam demonstrates a stable non healed type 2 dens fracture with 3 mm distraction of the fracture fragments. Stable symmetric fractures of the bilateral posterior arch of C1. Stable fracture along the T1 anterior osteophyte. No new fractures identified. Anterior fusion hardware intact and unchanged at the C3-4 level. 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 These findings are stable. Bilateral neural foraminal narrowing at the C3-4 level and to lesser extent  at the C4-5 level. Neural foraminal narrowing bilaterally at the C5-6 level right worse than left. Minimal right-sided neural foraminal narrowing at the C6-7 level. Soft tissues and spinal canal: Prevertebral soft tissues are unremarkable. Disc levels: Obliteration of the disc space at the C4-5 and C5-6 levels with disc space narrowing at the C6-7 and C7-T1 levels. Upper chest: No acute findings. Other: None. IMPRESSION: 1. No acute findings. 2. Stable non healed type 2 dens fracture with 3 mm distraction of the fracture fragments. Stable symmetric fractures of the bilateral posterior arch of C1. Stable fracture along the T1 anterior osteophyte. 3. Stable anterior fusion hardware at the C3-4 level. 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 4. Moderate spondylosis throughout the cervical spine with multilevel disc disease and neural foraminal narrowing as described. Electronically Signed   By: Toribio Agreste M.D.   On: 03/31/2024 14:56   DG Shoulder Left Result Date: 03/31/2024 CLINICAL DATA:  Fall.  Left shoulder pain. EXAM: LEFT SHOULDER - 2+ VIEW  COMPARISON:  None Available. FINDINGS: There is no evidence of fracture or dislocation. Moderate glenohumeral osteoarthritis noted. Tiny calcification seen in the distal rotator cuff tendon. IMPRESSION: No acute findings. Moderate glenohumeral osteoarthritis. Calcific tendinopathy of rotator cuff. Electronically Signed   By: Norleen DELENA Kil M.D.   On: 03/31/2024 13:47   DG Pelvis Portable Result Date: 03/31/2024 CLINICAL DATA:  Fall.  Pelvic pain. EXAM: PORTABLE PELVIS 1-2 VIEWS COMPARISON:  07/27/2023 FINDINGS: There is no evidence of pelvic fracture or diastasis. No pelvic bone lesions are seen. Lower lumbar spine degenerative changes again noted. IMPRESSION: No acute findings. Electronically Signed   By: Norleen DELENA Kil M.D.   On: 03/31/2024 13:45   DG Chest Portable 1 View Result Date: 03/31/2024 CLINICAL DATA:  Fall last night.  Found on floor this morning. EXAM: PORTABLE CHEST 1 VIEW COMPARISON:  07/27/2023 FINDINGS: Low lung volumes are noted, and patient is partially rotated to the right. Heart size and mediastinal contours are stable. No evidence of pneumothorax or hemothorax. Mild atelectasis is seen in the lateral right lung base. Several old right rib fracture deformities are again noted. IMPRESSION: Low lung volumes with mild right basilar atelectasis. Electronically Signed   By: Norleen DELENA Kil M.D.   On: 03/31/2024 13:44      Assessment/Plan   Principal Problem:   T6 vertebral fracture (HCC) Active Problems:   Trigeminal neuralgia   Recurrent falls   Subdural hematoma (HCC)   Lactic acidosis   Leukocytosis   Chronic alcohol abuse   History of COPD   Bipolar disorder (HCC)   Chronic diastolic CHF (congestive heart failure) (HCC)   Chronic anemia     #) acute T6 vertebral fracture: In the setting of ground-level mechanical fall on 03/30/2024, with ensuing development of acute mid thoracic midline back discomfort, today CT imaging shows acute fracture of the anterior aspect of T6  vertebral extending from the anterior vertebral body cortex into the inferior endplate, without any evidence of corresponding retropulsion.  Evidence of acute red flag symptoms.  Pain control remains suboptimal after multiple doses of IV and oral analgesics in the emergency department this evening.  EDP discussed patient's case with on-call neurosurgery, Dr. Darnella, who will formally consult, recommends initiation of TLSO brace as well as outpatient follow-up in neurosurgery clinic in about 6 [place].   Plan: Prn IV fentanyl .  Lidoderm  patch.  Fall precautions ordered.  Neurosurgery to consult, as above.  TLSO brace, per neurosurgery recommendations.  PT/OT consults ordered.  Start scheduled Colace, as needed MiraLAX  for constipation.                   #) Subdural hematoma: In the setting of ground-level mechanical fall on 03/30/2024 which patient hit his head, on Plavix  as an outpatient.,  Today's CT head showed evidence of a small subdural hematoma, potentially acute in nature, but without any evidence of mass effect or midline shift.  No evidence of acute focal neurologic deficits.  EDP discussed patient's case with on-call neurosurgery, Dr. Darnella, who recommends repeat CT head to be performed approximately 6 hours from timing of initial CT head, holding of home Plavix  until 04/05/2024, as well as goal systolic blood pressure less than 140 mmHg. no surgery to formally consult, giving no indication for urgent surgical intervention at this time.   As the patient is now greater than 6 months out from his MI, will hold Plavix  per temporal recommendation and from neurosurgery.  Plan: Neurosurgery to consult, as above.  Hold home Plavix  until 04/05/2024, as above.  Repeat CBC in the morning.  Every 4 hours neurochecks.  Fall precautions ordered.   PTT, INR in the morning.  Prn IV labetalol  for systolic blood pressure greater than 140, per recommendation of neurosurgery.  Monitor on  telemetry                      #) Recurrent falls: As a documented history of recurrent falls, with suspected contribution from his chronic alcohol abuse.  He was context, he presents with an additional ground-level mechanical fall that the patient reports occurred on the evening of 03/30/2024, and appears to have resulted in an acute T6 vertebral fracture, as well as potential acute subdural hematoma, as above..  No overt evidence of underlying infectious contribution, will follow for result urinalysis, which is currently pending.  Otherwise, no evidence of underlying infectious process, including CT chest which showed no evidence of infiltrate to suggest pneumonia.  Plan: Follow-up result urinalysis.  Repeat CBC in the morning.  Fall precautions ordered.  PT/OT consults ordered for the morning.  Check B12.  Check Lamictal  level.                     #) Lactic acidosis: Initial lactate was elevated at 3.6, repeat trending down to 1.9.  Suspect that this is on the basis of alcoholic lactic acidosis in the setting of the patient's chronic alcohol abuse.  Differential also includes potential contribution from suspected acute subdural hematoma.  Metter may also be an element of starvation keto lactic acidosis given that the patient was less able to procure food over the course of the last day in the setting of his new onset midthoracic back discomfort.  No evidence of an overt infectious process at this time, but will follow-up for results of urinalysis.  Plan: Lactated Ringer 's at 100 cc/h x 12 hours.  Follow result urinalysis.  Repeat CBC in the morning.  Further evaluation management of chronic alcohol abuse, as below.  Further evaluation management of acute subdural hematoma, as above.  Repeat CMP in the morning.                         #) Leukocytosis: Presenting CBC reflects mildly elevated white cell count of 13,400. Suspect an element of  hemoconcentration in the setting of clinical evidence of dehydration. This may also be reactive in nature in the  setting of ground-level mechanical fall with resultant acute T6 vertebral fracture along with finding of subdural hematoma on presenting CT head. No evidence to suggest underlying infectious process at this time, including CT chest, which showed no evidence of acute cardiopulmonary process, including no evidence of infiltrate to suggest pneumonia.  Urinalysis has been ordered, and is currently pending at this time.  In the absence of overt underlying infectious process, criteria for sepsis are not currently met.  He appears hemodynamically stable.  Will therefore refrain from initiation of antibiotics at this time.    Plan: Repeat CBC with diff in the morning.  Monitor strict I's and O's, daily weights.  Follow-up for result urinalysis.  Gentle IV fluids, as above.                     #) Esophageal thickening: Today CT imaging showed mild thickening of the mid esophagus, with findings potentially representative of esophagitis per radiology report, with radiology conveying recommendation for potential esophagram versus endoscopy on a nonemergent/outpatient basis.  It is noted that the patient is at increased risk for esophagitis in the context of his chronic alcohol abuse.  No overt evidence of acute upper gastrointestinal bleed at this time.  Plan: Protonix  40 mg IV twice daily, first dose now.  Repeat CMP in the morning.  PTT, INR in the morning.  Further evaluation management of chronic alcohol use, as below.                      #) Chronic Alcohol Abuse: the patient reports typical daily alcohol consumption of at least 4-5 shots of whiskey. Most recent alcohol consumption occurred on the evening of 03/30/2024, consistent with presenting serum ethanol level of 19. Close monitoring for development of evidence of alcohol withdrawal, including close attention  to trend in vital signs, as well as close monitoring of electrolytes, as described below.   Plan: counseled the patient on the importance of reduction in alcohol consumption. Consult to transition of care team placed. Close monitoring of ensuing BP and HR via routine VS. Symptoms-based CIWA protocol with prn Ativan ordered. Seizure precautions. Telemetry. Add-on serum Mg level. Repeat CMP in the morning. Check INR/PTT.  Daily folic acid , multivitamin, and thiamine  supplementation, first dose of thiamine  supplementation to occur now.                     #) COPD: Documented history thereof, without clinical evidence of acute exacerbation at this time.     Plan: . Prn albuterol  nebulizer. Check CMP and serum magnesium level in the AM.                       #) history of bipolar disorder: Documented history of such, for which patient is on Lamictal  as an outpatient.  In the setting of his recurrent falls, will check Lamictal  level.  Plan: Add on Lamictal  level, as above, followed by resumption of home Lamictal .                     #) Trigeminal neuralgia: Documented history of such, with outpatient medications notable for Lamictal  as well as Lyrica .  Plan: Follow-up for result of Lamictal  level, as above, following which Lamictal  will be resumed.  Resume home Lyrica .                        #) Chronic diastolic heart  failure: documented history of such, with most recent echocardiogram performed May 2025, notable for LVEF 65 to 70%, grade 1 diastolic dysfunction. No clinical or radiographic evidence to suggest acutely decompensated heart failure at this time. home diuretic regimen reportedly consists of the following: Lasix 40 mg p.o. daily as well as spironolactone .  Clinically, patient appears mildly dry at the time of this evening's presentation, in the context of recent decline in oral intake over the preceding 1 day.   Consequently, we will hold him diuretic medications while providing gentle IV fluids.  Plan: monitor strict I's & O's and daily weights. Repeat CMP in AM. Check serum mag level.  Hold next doses of Lasix and spironolactone , as above.  Check BNP.                      #) Chronic macrocytic anemia: Documented history of such, with baseline hemoglobin 10-13, presenting hemoglobin consistent with this baseline range.  Will continue to trend ensuing hemoglobin level, particular in the context of presenting subdural hematoma.   Plan: Repeat CBC in the morning.  Check folic acid  level, B12, PTT, INR.  Further evaluation management of subdural hematoma, as above.        DVT prophylaxis: SCD's   Code Status: Full code Family Communication: none Disposition Plan: Per Rounding Team Consults called: EDP discussed with on-call neurosurgery, Dr. Darnella, who will consult and conveyed the following recommendations: regarding patient's acute T6 fx, Dr. Darnella recommends TLSO brace and outpatient follow-up in neurosurgery clinic in 6 [place]. Regarding subdural hematoma, Dr. Darnella recommends repeat CT head in 6 hours following initial CT head, holding Plavix  until 11/17 (may restart on 11/17), and goal SBP < 140 mmHg. ;  Admission status: Observation     I SPENT GREATER THAN 75  MINUTES IN CLINICAL CARE TIME/MEDICAL DECISION-MAKING IN COMPLETING THIS ADMISSION.      Eva NOVAK Irine Heminger DO [facility]  From 7PM - 7AM   03/31/2024, 8:10 PM

## 2024-03-31 NOTE — Progress Notes (Signed)
 Orthopedic Tech Progress Note Patient Details:  Gabriel Richardson July 24, 1945 [id number]  Ortho Devices Type of Ortho Device: Thoracolumbar corset (TLSO) Ortho Device/Splint Interventions: Ordered, Application, Adjustment   Post Interventions Patient Tolerated: Well Instructions Provided: Adjustment of device  Gabriel Richardson 03/31/2024, 6:26 PM

## 2024-03-31 NOTE — Consult Note (Addendum)
 Neurosurgery Consult Note  Assessment:  78 y/ M w/ hx alcohol abuse, plavix  who presents after a fall. Found to have left convexity SDH  Plan:  Recommend CT head 6 hours from the first. If CT is stable, ok to discharge home and can restart plavix  on 11/17 SBP<140  Recommend TLSO brace for Tspine fracture Will plan to see him [address] from now with x-rays     CC: back pain  HPI:     Patient is a 78 y.o. male w/ hx alcohol use, on plavix , previous thoracic laminectomy, previous C3-4 ACDF, known odontoid fx with non-union who presents after a fall. Unclear if it happened last night or this morning. Currently he denies headache. He has a long hx of repeated falls. He states he has been worked up for this and neurologist told him it was likely due to early stages of dementia. He is also c/o back pain. He denies leg weakness/numbness. He is established with Dr. Debby, and the patients states he would like to follow up with him  CT head shows a left convexity SDH likely subacute to chronic in nature. He also has a known stable C2 fracture with non-union. CTCAP shows an acute vertebral body fracture of T6 plus a chronic appearing fracture of T8   Patient Active Problem List   Diagnosis Date Noted   Left ventricular apical thrombus 09/08/2023   Falls frequently 09/08/2023   HFrEF (heart failure with reduced ejection fraction) (HCC) 09/08/2023   Syncope 07/27/2023   Thoracic myelopathy 08/08/2020   Closed fracture of right distal fibula 01/07/2019   Right trigeminal neuralgia 07/31/2015   Benign essential tremor 07/31/2015   Ptosis 12/14/2012   Tremor 12/14/2012   Depression 11/02/2012   Asthma, chronic 11/02/2012   HYPERTENSION, BENIGN 01/15/2009   ABNORMAL ELECTROCARDIOGRAM 01/12/2009   Past Medical History:  Diagnosis Date   Ankle fracture    right with syndesmotic disruption   Anxiety    Arthritis    Asthma    Bipolar disorder (HCC)    Cervical stenosis of spinal canal     Closed fracture of right distal fibula 01/07/2019   COPD (chronic obstructive pulmonary disease) (HCC)    Depression    Emphysema of lung (HCC)    Neuromuscular disorder (HCC)    Trigeminal neuralgia    Wears glasses     Past Surgical History:  Procedure Laterality Date   CERVICAL FUSION     COLONOSCOPY  10 years ago    At [facility]-normal exam   LUMBAR LAMINECTOMY     ORIF ANKLE FRACTURE Right 01/07/2019   Procedure: Right ankle open reduction internal fixation with syndesmosis fixation;  Surgeon: Sharl Selinda Dover, MD;  Location: [facility] OR;  Service: Orthopedics;  Laterality: Right;  90 mins   SPINE SURGERY     Cervical C3-C5 anterior fusion   THORACIC DISCECTOMY N/A 08/08/2020   Procedure: Thoracic two- thoracic three Laminectomy;  Surgeon: Unice Pac, MD;  Location: [facility] OR;  Service: Neurosurgery;  Laterality: N/A;    (Not in a hospital admission)  No Known Allergies  Social History   Tobacco Use   Smoking status: Former    Current packs/day: 0.00    Average packs/day: 1.5 packs/day for 25.0 years (37.5 ttl pk-yrs)    Types: Cigarettes    Start date: 05/20/1972    Quit date: 05/20/1997    Years since quitting: 26.8   Smokeless tobacco: Never  Substance Use Topics   Alcohol use: Yes  Alcohol/week: 2.0 standard drinks of alcohol    Types: 2 Shots of liquor per week    Comment: 2 glasses of whiskey    Family History  Problem Relation Age of Onset   Kidney disease Mother    Breast cancer Mother        Living, 46   Diabetes Mellitus II Father        Deceased, 67   Tremor Father    Depression Daughter    Healthy Brother    Colon cancer Neg Hx      Review of Systems Pertinent items are noted in HPI.  Objective:   Patient Vitals for the past 8 hrs:  BP Temp Temp src Pulse Resp SpO2 Height Weight  03/31/24 1615 106/77 -- -- 73 15 100 % -- --  03/31/24 1529 -- 97.7 F (36.5 C) Oral -- -- -- -- --  03/31/24 1500 117/77 -- -- 73 12 100 % -- --   03/31/24 1430 121/81 -- -- 76 18 100 % -- --  03/31/24 1330 110/77 -- -- 67 16 98 % -- --  03/31/24 1214 99/83 -- -- 68 18 100 % -- --  03/31/24 1212 -- -- -- -- -- -- 5' 10 (1.778 m) 78 kg   No intake/output data recorded. No intake/output data recorded.    Exam: GCS 4E 5V 26M  AOx3 No aphasia or dysarthria PERRL EOMI, conjugate gaze FS TM Full strength BUE and BLE  Full sensation     Data ReviewCBC:  Lab Results  Component Value Date   WBC 13.4 (H) 03/31/2024   RBC 3.40 (L) 03/31/2024

## 2024-03-31 NOTE — ED Triage Notes (Signed)
 Pt bib [facility] coming from home after pt had fall last night and was found on floor by a friend this morning. Pt reports to EMS that he is on a blood thinner but does not know what medication or why he is on it. Dried blood noted to back of head. Pt has chronic neck/back pain. EMS states unable to place c-collar because it could not fit on pt.

## 2024-03-31 NOTE — Progress Notes (Signed)
 Transition of Care [facility]) - CAGE-AID Screening   Patient Details  Name: Gabriel Richardson MRN: [id number] Date of Birth: 13-Mar-1946  [facility] CM/SW Contact:    Bernardino Mayotte, RN Phone Number: 03/31/2024, 9:26 PM   Clinical Narrative:  Patient endorses small alcohol consumption, denies illicit drugs. Resources not given at this time.  CAGE-AID Screening:    Have You Ever Felt You Ought to Cut Down on Your Drinking or Drug Use?: No Have People Annoyed You By Critizing Your Drinking Or Drug Use?: No Have You Felt Bad Or Guilty About Your Drinking Or Drug Use?: No Have You Ever Had a Drink or Used Drugs First Thing In The Morning to Steady Your Nerves or to Get Rid of a Hangover?: No CAGE-AID Score: 0  Substance Abuse Education Offered: No

## 2024-03-31 NOTE — Progress Notes (Signed)
 Orthopedic Tech Progress Note Patient Details:  Gabriel Richardson January 22, 1946 [id number]  Patient ID: SHAHRAM ALEXOPOULOS, male   DOB: [birth year], 78 y.o.   MRN: [id number] Responded to Level 2 Trauma Ortho Tech not needed. Thersia FALCON Ahtziri Jeffries 03/31/2024, 12:51 PM

## 2024-03-31 NOTE — ED Notes (Signed)
 Pt states the pain medication needs time to set in and he wants 15 minutes before going to CT to tolerate transport to table. EDP notified.

## 2024-03-31 NOTE — ED Notes (Addendum)
 Pt transported to CT by this RN but unable to tolerate laying flat on CT table. Pt states he wants more pain medication and he can't do it until he gets more. Pt was educated on importance of timely CT start but patient still requesting pain medication. EDP notified.

## 2024-03-31 NOTE — ED Notes (Addendum)
ED Provider at bedside speaking with POA.

## 2024-03-31 NOTE — ED Provider Notes (Signed)
 Care seen from previous clinical research associate.  See note for full HPI.  78 year old here for fall.  Unsure if last night or this morning patient has recurrent falls, EtOH abuse. seen previously for dens fracture after fall however left the hospital at that time.  Had another fall today.  Pending CT head.  Patient initially wanted to leave AGAINST MEDICAL ADVICE however he was possibly confused?  Previous writer's woke with POA.  Will come to see patient. Physical Exam  BP 110/62   Pulse 79   Temp (!) 97.4 F (36.3 C) (Oral)   Resp 17   Ht 5' 10 (1.778 m)   Wt 78 kg   SpO2 98%   BMI 24.68 kg/m   Physical Exam Vitals and nursing note reviewed.  Constitutional:      General: He is not in acute distress.    Appearance: He is well-developed. He is not ill-appearing, toxic-appearing or diaphoretic.  HENT:     Head: Normocephalic.     Comments: Abrasion to post scalp    Nose: Nose normal.     Mouth/Throat:     Mouth: Mucous membranes are moist.  Eyes:     Pupils: Pupils are equal, round, and reactive to light.  Cardiovascular:     Rate and Rhythm: Normal rate and regular rhythm.     Pulses: Normal pulses.          Radial pulses are 2+ on the right side and 2+ on the left side.       Dorsalis pedis pulses are 2+ on the right side and 2+ on the left side.     Heart sounds: Normal heart sounds.  Pulmonary:     Effort: Pulmonary effort is normal. No respiratory distress.     Breath sounds: Normal breath sounds.  Abdominal:     General: Bowel sounds are normal. There is no distension.     Palpations: Abdomen is soft.     Tenderness: There is no abdominal tenderness. There is no right CVA tenderness, left CVA tenderness, guarding or rebound.  Musculoskeletal:        General: Normal range of motion.     Cervical back: Normal range of motion and neck supple.     Comments: Moves all 4 extremities, compartments soft. Refuses to sit up to assess posterior thorax  Skin:    General: Skin is warm and dry.      Capillary Refill: Capillary refill takes less than 2 seconds.     Comments: Various abrasions  Neurological:     General: No focal deficit present.     Mental Status: He is alert and oriented to person, place, and time.     Motor: No weakness.     Gait: Gait abnormal.     Comments: Unable to ambulate 2/2 pain in back     Procedures  .Critical Care  Performed by: Edie Rosebud LABOR, PA-C Authorized by: Edie Rosebud LABOR, PA-C   Critical care provider statement:    Critical care time (minutes):  35   Critical care was necessary to treat or prevent imminent or life-threatening deterioration of the following conditions:  Trauma and circulatory failure   Critical care was time spent personally by me on the following activities:  Development of treatment plan with patient or surrogate, discussions with consultants, evaluation of patient's response to treatment, examination of patient, ordering and review of laboratory studies, ordering and review of radiographic studies, ordering and performing treatments and interventions, pulse oximetry, re-evaluation of  patient's condition and review of old charts .Ortho Injury Treatment  Date/Time: 03/31/2024 7:20 PM  Performed by: Edie Einstein A, PA-C Authorized by: Edie Einstein LABOR, PA-C   Consent:    Consent obtained:  Verbal   Consent given by:  Patient and healthcare agent   Risks discussed:  Fracture, irreducible dislocation, nerve damage, recurrent dislocation, restricted joint movement, stiffness and vascular damage   Alternatives discussed:  No treatment, alternative treatment, immobilization, referral and delayed treatmentPre-procedure neurovascular assessment: neurovascularly intact Pre-procedure distal perfusion: normal Pre-procedure neurological function: normal Pre-procedure range of motion: normal  Anesthesia: Local anesthesia used: no  Patient sedated: NoImmobilization: brace Splint type: TLSO. Splint Applied by:  Kemp Balm Post-procedure neurovascular assessment: post-procedure neurovascularly intact Post-procedure distal perfusion: normal Post-procedure neurological function: normal Post-procedure range of motion: normal    Labs Reviewed  CBC WITH DIFFERENTIAL/PLATELET - Abnormal; Notable for the following components:      Result Value   WBC 13.4 (*)    RBC 3.40 (*)    Hemoglobin 12.0 (*)    HCT 35.5 (*)    MCV 104.4 (*)    MCH 35.3 (*)    Neutro Abs 10.6 (*)    Abs Immature Granulocytes 0.12 (*)    All other components within normal limits  ETHANOL - Abnormal; Notable for the following components:   Alcohol, Ethyl (B) 19 (*)    All other components within normal limits  COMPREHENSIVE METABOLIC PANEL WITH GFR - Abnormal; Notable for the following components:   Glucose, Bld 103 (*)    Calcium 8.7 (*)    Total Bilirubin 1.3 (*)    Anion gap 18 (*)    All other components within normal limits  PROTIME-INR - Abnormal; Notable for the following components:   Prothrombin Time 16.0 (*)    All other components within normal limits  I-STAT CHEM 8, ED - Abnormal; Notable for the following components:   Creatinine, Ser 1.30 (*)    Glucose, Bld 102 (*)    Calcium, Ion 1.05 (*)    Hemoglobin 12.9 (*)    HCT 38.0 (*)    All other components within normal limits  I-STAT CG4 LACTIC ACID, ED - Abnormal; Notable for the following components:   Lactic Acid, Venous 3.6 (*)    All other components within normal limits  CK  URINALYSIS, ROUTINE W REFLEX MICROSCOPIC  CBC WITH DIFFERENTIAL/PLATELET  COMPREHENSIVE METABOLIC PANEL WITH GFR  MAGNESIUM  MAGNESIUM  I-STAT CG4 LACTIC ACID, ED  SAMPLE TO BLOOD BANK  TROPONIN I (HIGH SENSITIVITY)  TROPONIN I (HIGH SENSITIVITY)   CT CHEST ABDOMEN PELVIS W CONTRAST Result Date: 03/31/2024 CLINICAL DATA:  Trauma. EXAM: CT CHEST, ABDOMEN, AND PELVIS WITH CONTRAST TECHNIQUE: Multidetector CT imaging of the chest, abdomen and pelvis was performed following  the standard protocol during bolus administration of intravenous contrast. RADIATION DOSE REDUCTION: This exam was performed according to the departmental dose-optimization program which includes automated exposure control, adjustment of the mA and/or kV according to patient size and/or use of iterative reconstruction technique. CONTRAST:  75mL OMNIPAQUE  IOHEXOL  350 MG/ML SOLN COMPARISON:  None Available. FINDINGS: CT CHEST FINDINGS Cardiovascular: There is no cardiomegaly or pericardial effusion. Mild atherosclerotic calcification of the thoracic aorta. No aneurysmal dilatation or dissection. The origins of the great vessels of the aortic arch and the central pulmonary arteries appear patent as visualized. Mediastinum/Nodes: No hilar or mediastinal adenopathy. Mild thickened appearance of the mid esophagus with infiltration of the surrounding fat plane may represent esophagitis. Underlying  esophageal neoplasm is not excluded. Esophagram or endoscopy may provide better evaluation on a nonemergent/outpatient basis. No mediastinal fluid collection. Lungs/Pleura: No focal consolidation, pleural effusion, pneumothorax. The central airways are patent. Musculoskeletal: Osteopenia with severe degenerative changes of the spine. Acute fracture of the anterior T6 vertebra extending from the anterior vertebral body cortex into the inferior endplate. There is also an age indeterminate fracture extending from the anterior T8 osteophyte into the T8 vertebra. Multiple old healed bilateral rib fractures. CT ABDOMEN PELVIS FINDINGS No intra-abdominal free air or free fluid. Hepatobiliary: Fatty liver with morphologic changes of cirrhosis. No biliary dilatation. Small gallstone. No pericholecystic fluid or evidence of acute cholecystitis by CT. Pancreas: Unremarkable. No pancreatic ductal dilatation or surrounding inflammatory changes. Spleen: Normal in size without focal abnormality. Adrenals/Urinary Tract: The adrenal glands are  unremarkable. There is no hydronephrosis on either side. The visualized ureters and urinary bladder probable. Stomach/Bowel: There is sigmoid diverticulosis. There is no bowel obstruction or active inflammation. The appendix is normal. Vascular/Lymphatic: No adenopathy. Reproductive: The prostate and seminal vesicles are grossly remarkable. Other: None Musculoskeletal: Osteopenia with severe degenerative changes of spine. Chronic appearing compression fractures of L4 and L5 with approximately 50% loss of vertebral body height. Correlation with clinical exam and point tenderness recommended. IMPRESSION: 1. Acute fracture of the anterior T6 vertebra extending from the anterior vertebral body cortex into the inferior endplate. No retropulsion. 2. Age indeterminate fracture extending from the anterior T8 osteophyte into the T8 vertebra. 3. Chronic appearing compression fractures of L4 and L5 with approximately 50% loss of vertebral body height. Correlation with clinical exam and point tenderness recommended. 4. No acute/traumatic intra-abdominal or pelvic pathology. 5. Fatty liver with morphologic changes of cirrhosis. 6. Cholelithiasis. 7. Sigmoid diverticulosis. 8. Mild thickened appearance of the mid esophagus with infiltration of the surrounding fat plane may represent esophagitis. Underlying esophageal neoplasm is not excluded. Esophagram or endoscopy may provide better evaluation on a nonemergent/outpatient basis. 9.  Aortic Atherosclerosis (ICD10-I70.0). Electronically Signed   By: Vanetta Chou M.D.   On: 03/31/2024 17:41   DG Lumbar Spine Complete Result Date: 03/31/2024 CLINICAL DATA:  Fall EXAM: LUMBAR SPINE - COMPLETE 4+ VIEW COMPARISON:  03/18/2023 FINDINGS: Stable lumbar alignment with grade 1 anterolisthesis L5 on S1 and trace retrolisthesis L1 on L2 and L2 on L3. Mild chronic superior endplate deformity at L2. Interval age indeterminate moderate superior endplate deformities at L4 and L5. Moderate  disc space narrowing and degenerative change at L5-S1 and at L1-L2 and L2-L3. Multilevel facet degenerative changes. Aortic atherosclerosis IMPRESSION: 1. Interval age indeterminate moderate superior endplate deformities at L4 and L5. 2. Chronic mild superior endplate deformity at L2. 3. Multilevel degenerative changes. Electronically Signed   By: Luke Bun M.D.   On: 03/31/2024 16:05   CT Head Wo Contrast Result Date: 03/31/2024 EXAM: CT HEAD WITHOUT CONTRAST 03/31/2024 02:10:00 PM TECHNIQUE: CT of the head was performed without the administration of intravenous contrast. Automated exposure control, iterative reconstruction, and/or weight based adjustment of the mA/kV was utilized to reduce the radiation dose to as low as reasonably achievable. COMPARISON: CT head 10/23/2023. CLINICAL HISTORY: Head trauma, minor (Age >= 65y). FINDINGS: BRAIN AND VENTRICLES: No evidence of acute infarct. No hydrocephalus. Overall similar mild parenchymal volume loss advanced for the patient's stated age. New 0.8 cm predominantly hypoattenuating left cerebral convexity extra axial fluid collection without significant mass effect or midline shift. Overall similar moderate-to-severe scattered white matter hypodensities which are nonspecific but most commonly represent chronic microvascular  ischemic changes. Redemonstrated chronic left occipital lobe infarction. ORBITS: Bilateral lens replacement. SINUSES: No acute abnormality. SOFT TISSUES AND SKULL: Decreased left parietal hematoma and soft tissue swelling. Right temporomandibular joint arthrosis. Partially imaged chronic cervical spine fractures. No skull fracture. IMPRESSION: 1. Since 10/23/2023, new 0.8 cm hypoattenuating left cerebral convexity extra-axial fluid collection, without significant mass effect or midline shift, likely representing subdural hematoma, possibly acute. 2. The above finding was communicated to [facility] by phone at 3:25 PM. Electronically  signed by: andrew bybordi 03/31/2024 03:27 PM EST RP Workstation: [id number]   CT Cervical Spine Wo Contrast Result Date: 03/31/2024 CLINICAL DATA:  Fall. EXAM: CT CERVICAL SPINE WITHOUT CONTRAST TECHNIQUE: Multidetector CT imaging of the cervical spine was performed without intravenous contrast. Multiplanar CT image reconstructions were also generated. RADIATION DOSE REDUCTION: This exam was performed according to the departmental dose-optimization program which includes automated exposure control, adjustment of the mA and/or kV according to patient size and/or use of iterative reconstruction technique. COMPARISON:  10/23/2023 FINDINGS: Alignment: No acute posttraumatic subluxation. Skull base and vertebrae: Moderate spondylosis throughout the cervical spine to include uncovertebral joint spurring and facet arthropathy. Exam demonstrates a stable non healed type 2 dens fracture with 3 mm distraction of the fracture fragments. Stable symmetric fractures of the bilateral posterior arch of C1. Stable fracture along the T1 anterior osteophyte. No new fractures identified. Anterior fusion hardware intact and unchanged at the C3-4 level. 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 These findings are stable. Bilateral neural foraminal narrowing at the C3-4 level and to lesser extent at the C4-5 level. Neural foraminal narrowing bilaterally at the C5-6 level right worse than left. Minimal right-sided neural foraminal narrowing at the C6-7 level. Soft tissues and spinal canal: Prevertebral soft tissues are unremarkable. Disc levels: Obliteration of the disc space at the C4-5 and C5-6 levels with disc space narrowing at the C6-7 and C7-T1 levels. Upper chest: No acute findings. Other: None. IMPRESSION: 1. No acute findings. 2. Stable non healed type 2 dens fracture with 3 mm distraction of the fracture fragments. Stable symmetric fractures of the bilateral posterior arch of C1. Stable fracture  along the T1 anterior osteophyte. 3. Stable anterior fusion hardware at the C3-4 level. 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 4. Moderate spondylosis throughout the cervical spine with multilevel disc disease and neural foraminal narrowing as described. Electronically Signed   By: Toribio Agreste M.D.   On: 03/31/2024 14:56   DG Shoulder Left Result Date: 03/31/2024 CLINICAL DATA:  Fall.  Left shoulder pain. EXAM: LEFT SHOULDER - 2+ VIEW COMPARISON:  None Available. FINDINGS: There is no evidence of fracture or dislocation. Moderate glenohumeral osteoarthritis noted. Tiny calcification seen in the distal rotator cuff tendon. IMPRESSION: No acute findings. Moderate glenohumeral osteoarthritis. Calcific tendinopathy of rotator cuff. Electronically Signed   By: Norleen DELENA Kil M.D.   On: 03/31/2024 13:47   DG Pelvis Portable Result Date: 03/31/2024 CLINICAL DATA:  Fall.  Pelvic pain. EXAM: PORTABLE PELVIS 1-2 VIEWS COMPARISON:  07/27/2023 FINDINGS: There is no evidence of pelvic fracture or diastasis. No pelvic bone lesions are seen. Lower lumbar spine degenerative changes again noted. IMPRESSION: No acute findings. Electronically Signed   By: Norleen DELENA Kil M.D.   On: 03/31/2024 13:45   DG Chest Portable 1 View Result Date: 03/31/2024 CLINICAL DATA:  Fall last night.  Found on floor this morning. EXAM: PORTABLE CHEST 1 VIEW COMPARISON:  07/27/2023 FINDINGS: Low lung volumes are  noted, and patient is partially rotated to the right. Heart size and mediastinal contours are stable. No evidence of pneumothorax or hemothorax. Mild atelectasis is seen in the lateral right lung base. Several old right rib fracture deformities are again noted. IMPRESSION: Low lung volumes with mild right basilar atelectasis. Electronically Signed   By: Norleen DELENA Kil M.D.   On: 03/31/2024 13:44    ED Course / MDM   Clinical Course as of 03/31/24 2001  Wed Mar 31, 2024  1348 Lactic Acid, Venous(!!):  3.6 [CA]  1355 TDAP updated in 2024 per chart review [CA]  1523 Discussed with Radiology. Pt with likely subdural hematoma. No midline shift [BH]  1614 Dr. Darnella with Neurosurgery, rec rescan in 6 hours and if no change can dc. Will see in consult [BH]  1748 Patient states he is displeased in his rooming situation.  States he wants a more comfortable room.  I discussed with patient he is in a trauma room currently.  There are no other rooms available. [BH]  1828 POA at bedside. Patient baseline, discussed place with repeat head CT at 2000. [BH]  1953 Dr. Marcene with medicine for admission  [BH]    Clinical Course User Index [BH] Abriel Geesey A, PA-C [CA] Lorelle Aleck BROCKS, PA-C   77 year old here for fall.  Unsure if last night or this morning patient has recurrent falls, EtOH abuse. seen previously for dens fracture after fall however left the hospital at that time.  Had another fall today.  Pending CT head.  Patient initially wanted to leave AGAINST MEDICAL ADVICE however he was possibly confused?  Previous writer's woke with POA.  Will come to see patient.  Labs and imaging personally viewed and interpreted:  Lactic 3.6---1.9 CBC hemoglobin 12, WBC 13 Metabolic panel gap 18 CK 70 Troponin 6 CT AP shows acute T6 fracture/ age indet T8 fx CT head shows subdural hematoma   Patient reassessed.  Still having pain, unable to walk due to pain in his back.  He was seen by neurosurgery, plan for T SLO and repeat head CT.  Due to continued pain will admit to medicine service.   CIWA ordered.  Denies history of DTs or withdrawal seizures.  POA at bedside.  Agreeable with admission.  CONSULT with Dr. Marcene with medicine who is agreeable to evaluate patient for admission.  Patient to be admitted for subdural hematoma as well as multiple compression fractures after fall.  Medicine to admit.  Pain controlled here.  Neurovascularly intact.    Medical Decision Making Amount  and/or Complexity of Data Reviewed External Data Reviewed: labs, radiology and notes. Labs: ordered. Decision-making details documented in ED Course. Radiology: ordered and independent interpretation performed. Decision-making details documented in ED Course.  Risk OTC drugs. Prescription drug management. Parenteral controlled substances. Decision regarding hospitalization. Diagnosis or treatment significantly limited by social determinants of health.         Edie Rosebud DELENA, PA-C 03/31/24 2001    Francesca Elsie CROME, MD 03/31/24 [phone]

## 2024-03-31 NOTE — ED Notes (Signed)
 Patient refusing CT until pain medication ordered. EDP made aware.

## 2024-03-31 NOTE — ED Provider Notes (Signed)
 [place] EMERGENCY DEPARTMENT AT [facility] Provider Note   CSN: [id number] Arrival date & time: 03/31/24  1208     Patient presents with: Gabriel Richardson is a 78 y.o. male with a past medical history of HF, MI, alcohol abuse, opioid abuse, bipolar disorder, and frequent falls who presented today from home by EMS after waking up on the floor. He states he remembers going to bed last night but does not remember the time between waking up this morning in his bed until waking up on the floor to EMS. Patient notes he woke up in a pool of blood by the dining room table. He is complaining of left shoulder pain, lower back pain and head pain. He states he takes 3,000mg  of Tylenol  daily for his chronic cervical stenosis.  Patient denies saddle anesthesia, bowel/bladder incontinence, lower extremity numbness/tingling, lower extremity weakness.  Denies chest pain and shortness of breath.  Notes he falls frequently.  Patient was in his normal state of health yesterday.  Of note, patient is a poor historian. Level 5 caveat.   History obtained from patient and past medical records. No interpreter used during encounter.      Prior to Admission medications   Medication Sig Start Date End Date Taking? Authorizing Provider  acetaminophen  (TYLENOL ) 500 MG tablet Take 1,000 mg by mouth every 6 (six) hours as needed for mild pain or headache.    [provider]  clopidogrel  (PLAVIX ) 75 MG tablet Take 1 tablet (75 mg total) by mouth daily. 07/30/23   Arlon Carliss ORN, DO  furosemide (LASIX) 40 MG tablet Take 20-40 mg by mouth See admin instructions. Take 40 mg by mouth once a day and an additional 20 mg as needed for leg swelling 06/02/23   [provider]  lamoTRIgine  (LAMICTAL ) 200 MG tablet Take 200 mg by mouth at bedtime. 01/18/15   [provider]  memantine  (NAMENDA ) 10 MG tablet TAKE 1 TABLET BY MOUTH 2 TIMES A DAY 08/25/23   Patel, Donika K, DO  pregabalin  (LYRICA )  100 MG capsule TAKE 1 CAPSULE BY MOUTH 2 TIMES A DAY 03/09/24   Jaffe, Adam R, DO  primidone  (MYSOLINE ) 50 MG tablet TAKE 2 TABLETS BY MOUTH TWICE A DAY 09/22/23   Patel, Donika K, DO  propranolol  ER (INDERAL  LA) 120 MG 24 hr capsule TAKE 1 CAPSULE BY MOUTH 2 TIMES A DAY OK TO TAKE EXTRA CAPSULE AS NEEDED FOR WORSENING TREMOR 08/01/23   Patel, Donika K, DO  spironolactone  (ALDACTONE ) 25 MG tablet Take 0.5 tablets (12.5 mg total) by mouth daily. 09/08/23   Chandrasekhar, Stanly LABOR, MD  zolpidem  (AMBIEN  CR) 12.5 MG CR tablet Take 12.5 mg by mouth daily at 6 (six) AM.    [provider]    Allergies: Patient has no known allergies.    Review of Systems  Respiratory:  Negative for shortness of breath.   Cardiovascular:  Negative for chest pain.  Gastrointestinal:  Negative for abdominal pain.  Musculoskeletal:  Positive for back pain and neck pain.  Skin:  Positive for wound.    Updated Vital Signs BP 117/77   Pulse 73   Temp 97.7 F (36.5 C) (Oral)   Resp 12   Ht 5' 10 (1.778 m)   Wt 78 kg   SpO2 100%   BMI 24.68 kg/m   Physical Exam Vitals and nursing note reviewed.  Constitutional:      General: He is not in acute distress.  Appearance: He is not ill-appearing.  HENT:     Head: Normocephalic.     Comments: Dried blood throughout right posterior scalp. Thoroughly washed hair. 2 superficial abrasions to posterior head. No lacerations.  Eyes:     Pupils: Pupils are equal, round, and reactive to light.  Neck:     Comments: C-collar placed. No cervical midline tenderness Cardiovascular:     Rate and Rhythm: Normal rate and regular rhythm.     Pulses: Normal pulses.     Heart sounds: Normal heart sounds. No murmur heard.    No friction rub. No gallop.  Pulmonary:     Effort: Pulmonary effort is normal.     Breath sounds: Normal breath sounds.  Abdominal:     General: Abdomen is flat. There is no distension.     Palpations: Abdomen is soft.     Tenderness: There is  no abdominal tenderness. There is no guarding or rebound.  Musculoskeletal:        General: Normal range of motion.     Cervical back: Neck supple.  Skin:    General: Skin is warm and dry.  Neurological:     General: No focal deficit present.     Mental Status: He is alert.     Comments: Speech is clear, able to follow commands CN III-XII intact Normal strength in upper and lower extremities bilaterally including dorsiflexion and plantar flexion, strong and equal grip strength Sensation grossly intact throughout Moves extremities without ataxia, coordination intact No pronator drift, does have a slight tremor which patient notes is baseline    Psychiatric:        Mood and Affect: Mood normal.        Behavior: Behavior normal.     (all labs ordered are listed, but only abnormal results are displayed) Labs Reviewed  CBC WITH DIFFERENTIAL/PLATELET - Abnormal; Notable for the following components:      Result Value   WBC 13.4 (*)    RBC 3.40 (*)    Hemoglobin 12.0 (*)    HCT 35.5 (*)    MCV 104.4 (*)    MCH 35.3 (*)    Neutro Abs 10.6 (*)    Abs Immature Granulocytes 0.12 (*)    All other components within normal limits  ETHANOL - Abnormal; Notable for the following components:   Alcohol, Ethyl (B) 19 (*)    All other components within normal limits  COMPREHENSIVE METABOLIC PANEL WITH GFR - Abnormal; Notable for the following components:   Glucose, Bld 103 (*)    Calcium 8.7 (*)    Total Bilirubin 1.3 (*)    Anion gap 18 (*)    All other components within normal limits  PROTIME-INR - Abnormal; Notable for the following components:   Prothrombin Time 16.0 (*)    All other components within normal limits  I-STAT CHEM 8, ED - Abnormal; Notable for the following components:   Creatinine, Ser 1.30 (*)    Glucose, Bld 102 (*)    Calcium, Ion 1.05 (*)    Hemoglobin 12.9 (*)    HCT 38.0 (*)    All other components within normal limits  I-STAT CG4 LACTIC ACID, ED - Abnormal;  Notable for the following components:   Lactic Acid, Venous 3.6 (*)    All other components within normal limits  URINALYSIS, ROUTINE W REFLEX MICROSCOPIC  CK  SAMPLE TO BLOOD BANK  TROPONIN I (HIGH SENSITIVITY)  TROPONIN I (HIGH SENSITIVITY)    EKG: EKG Interpretation  Date/Time:  Wednesday March 31 2024 13:23:09 EST Ventricular Rate:  70 PR Interval:  239 QRS Duration:  124 QT Interval:  451 QTC Calculation: 487 R Axis:   50  Text Interpretation: Sinus rhythm Prolonged PR interval Left bundle branch block Compared with prior EKG from 07/29/2023 Confirmed by Gennaro Bouchard ([id number]) on 03/31/2024 2:30:30 PM  Radiology: CT Head Wo Contrast Result Date: 03/31/2024 EXAM: CT HEAD WITHOUT CONTRAST 03/31/2024 02:10:00 PM TECHNIQUE: CT of the head was performed without the administration of intravenous contrast. Automated exposure control, iterative reconstruction, and/or weight based adjustment of the mA/kV was utilized to reduce the radiation dose to as low as reasonably achievable. COMPARISON: CT head 10/23/2023. CLINICAL HISTORY: Head trauma, minor (Age >= 65y). FINDINGS: BRAIN AND VENTRICLES: No evidence of acute infarct. No hydrocephalus. Overall similar mild parenchymal volume loss advanced for the patient's stated age. New 0.8 cm predominantly hypoattenuating left cerebral convexity extra axial fluid collection without significant mass effect or midline shift. Overall similar moderate-to-severe scattered white matter hypodensities which are nonspecific but most commonly represent chronic microvascular ischemic changes. Redemonstrated chronic left occipital lobe infarction. ORBITS: Bilateral lens replacement. SINUSES: No acute abnormality. SOFT TISSUES AND SKULL: Decreased left parietal hematoma and soft tissue swelling. Right temporomandibular joint arthrosis. Partially imaged chronic cervical spine fractures. No skull fracture. IMPRESSION: 1. Since 10/23/2023, new 0.8 cm hypoattenuating  left cerebral convexity extra-axial fluid collection, without significant mass effect or midline shift, likely representing subdural hematoma, possibly acute. 2. The above finding was communicated to [facility] by phone at 3:25 PM. Electronically signed by: andrew bybordi 03/31/2024 03:27 PM EST RP Workstation: [id number]   CT Cervical Spine Wo Contrast Result Date: 03/31/2024 CLINICAL DATA:  Fall. EXAM: CT CERVICAL SPINE WITHOUT CONTRAST TECHNIQUE: Multidetector CT imaging of the cervical spine was performed without intravenous contrast. Multiplanar CT image reconstructions were also generated. RADIATION DOSE REDUCTION: This exam was performed according to the departmental dose-optimization program which includes automated exposure control, adjustment of the mA and/or kV according to patient size and/or use of iterative reconstruction technique. COMPARISON:  10/23/2023 FINDINGS: Alignment: No acute posttraumatic subluxation. Skull base and vertebrae: Moderate spondylosis throughout the cervical spine to include uncovertebral joint spurring and facet arthropathy. Exam demonstrates a stable non healed type 2 dens fracture with 3 mm distraction of the fracture fragments. Stable symmetric fractures of the bilateral posterior arch of C1. Stable fracture along the T1 anterior osteophyte. No new fractures identified. Anterior fusion hardware intact and unchanged at the C3-4 level. 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 These findings are stable. Bilateral neural foraminal narrowing at the C3-4 level and to lesser extent at the C4-5 level. Neural foraminal narrowing bilaterally at the C5-6 level right worse than left. Minimal right-sided neural foraminal narrowing at the C6-7 level. Soft tissues and spinal canal: Prevertebral soft tissues are unremarkable. Disc levels: Obliteration of the disc space at the C4-5 and C5-6 levels with disc space narrowing at the C6-7 and C7-T1 levels.  Upper chest: No acute findings. Other: None. IMPRESSION: 1. No acute findings. 2. Stable non healed type 2 dens fracture with 3 mm distraction of the fracture fragments. Stable symmetric fractures of the bilateral posterior arch of C1. Stable fracture along the T1 anterior osteophyte. 3. Stable anterior fusion hardware at the C3-4 level. 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 4. Moderate spondylosis throughout the cervical spine with multilevel disc disease and neural foraminal narrowing as described. Electronically Signed  By: Toribio Agreste M.D.   On: 03/31/2024 14:56   DG Shoulder Left Result Date: 03/31/2024 CLINICAL DATA:  Fall.  Left shoulder pain. EXAM: LEFT SHOULDER - 2+ VIEW COMPARISON:  None Available. FINDINGS: There is no evidence of fracture or dislocation. Moderate glenohumeral osteoarthritis noted. Tiny calcification seen in the distal rotator cuff tendon. IMPRESSION: No acute findings. Moderate glenohumeral osteoarthritis. Calcific tendinopathy of rotator cuff. Electronically Signed   By: Norleen DELENA Kil M.D.   On: 03/31/2024 13:47   DG Pelvis Portable Result Date: 03/31/2024 CLINICAL DATA:  Fall.  Pelvic pain. EXAM: PORTABLE PELVIS 1-2 VIEWS COMPARISON:  07/27/2023 FINDINGS: There is no evidence of pelvic fracture or diastasis. No pelvic bone lesions are seen. Lower lumbar spine degenerative changes again noted. IMPRESSION: No acute findings. Electronically Signed   By: Norleen DELENA Kil M.D.   On: 03/31/2024 13:45   DG Chest Portable 1 View Result Date: 03/31/2024 CLINICAL DATA:  Fall last night.  Found on floor this morning. EXAM: PORTABLE CHEST 1 VIEW COMPARISON:  07/27/2023 FINDINGS: Low lung volumes are noted, and patient is partially rotated to the right. Heart size and mediastinal contours are stable. No evidence of pneumothorax or hemothorax. Mild atelectasis is seen in the lateral right lung base. Several old right rib fracture deformities are again  noted. IMPRESSION: Low lung volumes with mild right basilar atelectasis. Electronically Signed   By: Norleen DELENA Kil M.D.   On: 03/31/2024 13:44     .Critical Care  Performed by: Lorelle Aleck BROCKS, PA-C Authorized by: Lorelle Aleck BROCKS, PA-C   Critical care provider statement:    Critical care time (minutes):  31   Critical care was necessary to treat or prevent imminent or life-threatening deterioration of the following conditions:  Trauma   Critical care was time spent personally by me on the following activities:  Development of treatment plan with patient or surrogate, discussions with consultants, evaluation of patient's response to treatment, examination of patient, ordering and review of laboratory studies, ordering and review of radiographic studies, ordering and performing treatments and interventions, pulse oximetry, re-evaluation of patient's condition and review of old charts    Medications Ordered in the ED  fentaNYL  (SUBLIMAZE ) injection (50 mcg Intravenous Given 03/31/24 1250)  sodium chloride  0.9 % bolus 1,000 mL (has no administration in time range)  morphine (PF) 4 MG/ML injection 4 mg (4 mg Intravenous Given 03/31/24 1353)    Clinical Course as of 03/31/24 1603  Wed Mar 31, 2024  1348 Lactic Acid, Venous(!!): 3.6 [CA]  1355 TDAP updated in 2024 per chart review [CA]  1523 Discussed with Radiology. Pt with likely subdural hematoma. No midline shift [BH]    Clinical Course User Index [BH] Henderly, Britni A, PA-C [CA] Lorelle Aleck BROCKS, PA-C                                 Medical Decision Making Amount and/or Complexity of Data Reviewed Labs: ordered. Decision-making details documented in ED Course. Radiology: ordered and independent interpretation performed. Decision-making details documented in ED Course.  Risk Prescription drug management.   This patient presents to the ED for concern of trauma, this involves an extensive number of treatment options,  and is a complaint that carries with it a high risk of complications and morbidity.  The differential diagnosis includes intracranial bleed, bony fracture, intoxication, infection, etc  78 year old male presents to the ED after a fall.  Unclear  whether or not this was mechanical however, patient does have a history of frequent falls.  Patient notes he woke up by his dining table in a pool of blood.  On Plavix .  History of alcohol abuse. After initial evaluation, patient upgraded to level 2 trauma.  CT head and cervical spine ordered.  Patient also complaining of left shoulder and low back pain.  X-rays ordered.  Trauma labs ordered. Fentanyl  given.   CBC significant for leukocytosis at 13.4.  Anemia with hemoglobin at 12.  CMP with elevated total bilirubin 1.3.  Normal renal function. Lactic acid elevated. Patient refused IVFs.  Unfortunately there was a delay getting patient to CT scanner given patient refused numerous times.  Ethanol level slightly elevated at 19.  Patient notes he drinks daily typically 2 drinks.  Last drink last night.  CT head personally reviewed and interpreted which demonstrates a new 0.8 cm subdural hematoma.  CT cervical spine negative for any acute findings.  Does demonstrate stable nonhealed type II dens fracture and further stable fractures.  X-ray is negative for any bony fractures or acute abnormalities.  Reassessed patient to discuss CT head findings.  Patient made aware that he has a subdural hematoma.  Patient adamantly refuses admission or further workup.  I had a long discussion with patient for his need for CT chest, abdomen, and pelvis to rule out further injuries given unclear story.  Patient declined.  Discussed with patient's friend Izetta after patient gave me permission (on the phone).  Katie notes that Adina Maffucci is patient's healthcare power of attorney.  Notes that she was texting patient till 10 PM last night when he was acting his normal self.  Katie spoke  to patient on the phone and convinced patient to stay for the further work-up and admission. Left a message with his health care power of Attorney Rubie Maffucci [phone]) to call back the ED.   Patient handed off to [facility], PA-C at shift change pending CT C/A/P, IVFs, and admission.   Co morbidities that complicate the patient evaluation  HTN, COPS, anxiety Cardiac Monitoring: / EKG:  The patient was maintained on a cardiac monitor.  I personally viewed and interpreted the cardiac monitored which showed an underlying rhythm of: NSR, LBBB  Social Determinants of Health:  Alcohol abuse  Test / Admission - Considered:  Admit after further work-up for subdural hematoma      Final diagnoses:  Injury of head, initial encounter  Subdural hematoma [facility])    ED Discharge Orders     None          Lorelle Aleck BROCKS, PA-C 03/31/24 1603    Kammerer, Megan L, DO 04/01/24 [phone]

## 2024-03-31 NOTE — ED Notes (Signed)
 Pt refusing transport to CT until pain medication. Provider made aware by TRN.

## 2024-03-31 NOTE — ED Notes (Addendum)
 Patient requesting more pain meds. EDP notified of patient request. Pt expresses frustration regarding not being in a room. It was explained to patient that he is in a room but he states he wants a room upstairs. It was explained to patient that if it is decided he will be getting admitted, we cannot know exactly when he would get a room. Pt still expresses frustration. Supportive listening implemented.

## 2024-03-31 NOTE — Progress Notes (Signed)
 Orthopedic Tech Progress Note Patient Details:  Gabriel Richardson 03-10-1946 [id number]  Ortho Devices Type of Ortho Device: Thoracolumbar corset (TLSO) Ortho Device/Splint Location: TLSO Brace/Back Ortho Device/Splint Interventions: Ordered, Application, Adjustment   Post Interventions Patient Tolerated: Well Instructions Provided: Adjustment of device  Thersia FALCON Kaz Auld 03/31/2024, 8:04 PM

## 2024-03-31 NOTE — Discharge Instructions (Addendum)

## 2024-04-01 ENCOUNTER — Observation Stay (HOSPITAL_COMMUNITY)

## 2024-04-01 DIAGNOSIS — E872 Acidosis, unspecified: Secondary | ICD-10-CM | POA: Diagnosis present

## 2024-04-01 DIAGNOSIS — M4802 Spinal stenosis, cervical region: Secondary | ICD-10-CM | POA: Diagnosis present

## 2024-04-01 DIAGNOSIS — I5032 Chronic diastolic (congestive) heart failure: Secondary | ICD-10-CM | POA: Diagnosis present

## 2024-04-01 DIAGNOSIS — D72829 Elevated white blood cell count, unspecified: Secondary | ICD-10-CM | POA: Diagnosis present

## 2024-04-01 DIAGNOSIS — Y900 Blood alcohol level of less than 20 mg/100 ml: Secondary | ICD-10-CM | POA: Diagnosis present

## 2024-04-01 DIAGNOSIS — S12100K Unspecified displaced fracture of second cervical vertebra, subsequent encounter for fracture with nonunion: Secondary | ICD-10-CM | POA: Diagnosis not present

## 2024-04-01 DIAGNOSIS — W1830XA Fall on same level, unspecified, initial encounter: Secondary | ICD-10-CM | POA: Diagnosis present

## 2024-04-01 DIAGNOSIS — D649 Anemia, unspecified: Secondary | ICD-10-CM | POA: Diagnosis present

## 2024-04-01 DIAGNOSIS — S22069S Unspecified fracture of T7-T8 vertebra, sequela: Secondary | ICD-10-CM | POA: Diagnosis not present

## 2024-04-01 DIAGNOSIS — G500 Trigeminal neuralgia: Secondary | ICD-10-CM | POA: Diagnosis present

## 2024-04-01 DIAGNOSIS — J4489 Other specified chronic obstructive pulmonary disease: Secondary | ICD-10-CM | POA: Diagnosis present

## 2024-04-01 DIAGNOSIS — I5042 Chronic combined systolic (congestive) and diastolic (congestive) heart failure: Secondary | ICD-10-CM | POA: Diagnosis present

## 2024-04-01 DIAGNOSIS — N179 Acute kidney failure, unspecified: Secondary | ICD-10-CM

## 2024-04-01 DIAGNOSIS — S22059A Unspecified fracture of T5-T6 vertebra, initial encounter for closed fracture: Secondary | ICD-10-CM | POA: Diagnosis present

## 2024-04-01 DIAGNOSIS — Z8709 Personal history of other diseases of the respiratory system: Secondary | ICD-10-CM

## 2024-04-01 DIAGNOSIS — S22059D Unspecified fracture of T5-T6 vertebra, subsequent encounter for fracture with routine healing: Secondary | ICD-10-CM

## 2024-04-01 DIAGNOSIS — R55 Syncope and collapse: Secondary | ICD-10-CM | POA: Diagnosis not present

## 2024-04-01 DIAGNOSIS — D696 Thrombocytopenia, unspecified: Secondary | ICD-10-CM | POA: Diagnosis present

## 2024-04-01 DIAGNOSIS — F419 Anxiety disorder, unspecified: Secondary | ICD-10-CM | POA: Diagnosis present

## 2024-04-01 DIAGNOSIS — Z833 Family history of diabetes mellitus: Secondary | ICD-10-CM | POA: Diagnosis not present

## 2024-04-01 DIAGNOSIS — Y92009 Unspecified place in unspecified non-institutional (private) residence as the place of occurrence of the external cause: Secondary | ICD-10-CM | POA: Diagnosis not present

## 2024-04-01 DIAGNOSIS — Z818 Family history of other mental and behavioral disorders: Secondary | ICD-10-CM | POA: Diagnosis not present

## 2024-04-01 DIAGNOSIS — Z7902 Long term (current) use of antithrombotics/antiplatelets: Secondary | ICD-10-CM | POA: Diagnosis not present

## 2024-04-01 DIAGNOSIS — R296 Repeated falls: Secondary | ICD-10-CM | POA: Diagnosis present

## 2024-04-01 DIAGNOSIS — Z9181 History of falling: Secondary | ICD-10-CM | POA: Diagnosis not present

## 2024-04-01 DIAGNOSIS — F1010 Alcohol abuse, uncomplicated: Secondary | ICD-10-CM | POA: Diagnosis present

## 2024-04-01 DIAGNOSIS — Z803 Family history of malignant neoplasm of breast: Secondary | ICD-10-CM | POA: Diagnosis not present

## 2024-04-01 DIAGNOSIS — S065XAA Traumatic subdural hemorrhage with loss of consciousness status unknown, initial encounter: Secondary | ICD-10-CM | POA: Diagnosis present

## 2024-04-01 DIAGNOSIS — F319 Bipolar disorder, unspecified: Secondary | ICD-10-CM | POA: Diagnosis present

## 2024-04-01 DIAGNOSIS — S22050A Wedge compression fracture of T5-T6 vertebra, initial encounter for closed fracture: Secondary | ICD-10-CM | POA: Diagnosis present

## 2024-04-01 DIAGNOSIS — D539 Nutritional anemia, unspecified: Secondary | ICD-10-CM | POA: Diagnosis present

## 2024-04-01 DIAGNOSIS — Z87891 Personal history of nicotine dependence: Secondary | ICD-10-CM | POA: Diagnosis not present

## 2024-04-01 DIAGNOSIS — R569 Unspecified convulsions: Secondary | ICD-10-CM | POA: Diagnosis not present

## 2024-04-01 DIAGNOSIS — J439 Emphysema, unspecified: Secondary | ICD-10-CM | POA: Diagnosis present

## 2024-04-01 LAB — COMPREHENSIVE METABOLIC PANEL WITH GFR
ALT: 19 U/L (ref 0–44)
AST: 27 U/L (ref 15–41)
Albumin: 3.2 g/dL — ABNORMAL LOW (ref 3.5–5.0)
Alkaline Phosphatase: 75 U/L (ref 38–126)
Anion gap: 11 (ref 5–15)
BUN: 31 mg/dL — ABNORMAL HIGH (ref 8–23)
CO2: 25 mmol/L (ref 22–32)
Calcium: 8.3 mg/dL — ABNORMAL LOW (ref 8.9–10.3)
Chloride: 100 mmol/L (ref 98–111)
Creatinine, Ser: 1.34 mg/dL — ABNORMAL HIGH (ref 0.61–1.24)
GFR, Estimated: 55 mL/min — ABNORMAL LOW (ref >60)
Glucose, Bld: 106 mg/dL — ABNORMAL HIGH (ref 70–99)
Potassium: 5.1 mmol/L (ref 3.5–5.1)
Sodium: 136 mmol/L (ref 135–145)
Total Bilirubin: 1.2 mg/dL (ref 0.0–1.2)
Total Protein: 6.5 g/dL (ref 6.5–8.1)

## 2024-04-01 LAB — CBC WITH DIFFERENTIAL/PLATELET
Abs Immature Granulocytes: 0.02 K/uL (ref 0.00–0.07)
Basophils Absolute: 0 K/uL (ref 0.0–0.1)
Basophils Relative: 0 %
Eosinophils Absolute: 0 K/uL (ref 0.0–0.5)
Eosinophils Relative: 0 %
HCT: 27.3 % — ABNORMAL LOW (ref 39.0–52.0)
Hemoglobin: 9.3 g/dL — ABNORMAL LOW (ref 13.0–17.0)
Immature Granulocytes: 0 %
Lymphocytes Relative: 21 %
Lymphs Abs: 2 K/uL (ref 0.7–4.0)
MCH: 35.4 pg — ABNORMAL HIGH (ref 26.0–34.0)
MCHC: 34.1 g/dL (ref 30.0–36.0)
MCV: 103.8 fL — ABNORMAL HIGH (ref 80.0–100.0)
Monocytes Absolute: 1.3 K/uL — ABNORMAL HIGH (ref 0.1–1.0)
Monocytes Relative: 13 %
Neutro Abs: 6.3 K/uL (ref 1.7–7.7)
Neutrophils Relative %: 66 %
Platelets: 156 K/uL (ref 150–400)
RBC: 2.63 MIL/uL — ABNORMAL LOW (ref 4.22–5.81)
RDW: 13.4 % (ref 11.5–15.5)
WBC: 9.6 K/uL (ref 4.0–10.5)
nRBC: 0 % (ref 0.0–0.2)

## 2024-04-01 LAB — PROTIME-INR
INR: 1.2 (ref 0.8–1.2)
Prothrombin Time: 15.9 s — ABNORMAL HIGH (ref 11.4–15.2)

## 2024-04-01 LAB — BRAIN NATRIURETIC PEPTIDE: B Natriuretic Peptide: 149.6 pg/mL — ABNORMAL HIGH (ref 0.0–100.0)

## 2024-04-01 LAB — MAGNESIUM: Magnesium: 1.8 mg/dL (ref 1.7–2.4)

## 2024-04-01 LAB — APTT: aPTT: 30 s (ref 24–36)

## 2024-04-01 MED ORDER — LACTATED RINGERS IV SOLN: INTRAVENOUS | Status: DC

## 2024-04-01 MED ORDER — TRAMADOL HCL 50 MG PO TABS
50.0000 mg | ORAL_TABLET | Freq: Four times a day (QID) | ORAL | Status: DC | PRN
  Administered 2024-04-01: 50 mg via ORAL
  Filled 2024-04-01: qty 1

## 2024-04-01 MED ORDER — ACETAMINOPHEN 500 MG PO TABS
1000.0000 mg | ORAL_TABLET | Freq: Three times a day (TID) | ORAL | Status: DC
  Administered 2024-04-01 – 2024-04-03 (×7): 1000 mg via ORAL
  Filled 2024-04-01 (×7): qty 2

## 2024-04-01 MED ORDER — ALUM & MAG HYDROXIDE-SIMETH 200-200-20 MG/5ML PO SUSP: 15.0000 mL | ORAL | Status: DC | PRN

## 2024-04-01 MED ORDER — OXYCODONE HCL 5 MG PO TABS
5.0000 mg | ORAL_TABLET | Freq: Four times a day (QID) | ORAL | Status: DC | PRN
  Administered 2024-04-01 – 2024-04-03 (×6): 5 mg via ORAL
  Filled 2024-04-01 (×6): qty 1

## 2024-04-01 NOTE — Progress Notes (Signed)
 PROGRESS NOTE   Gabriel Richardson  FMW:[id number]    DOB: 1945-07-26    DOA: 03/31/2024  PCP: Aisha Harvey, MD   I have briefly reviewed patients previous medical records in [facility].   Brief Hospital Course:  78 year old male, lives alone, ambulates with the help of a walker, reports that he has caregivers that intermittently help him at home but no one with him 24/7, medical history significant for alcohol use disorder, recurrent falls, chronic anemia, anxiety and depression/bipolar disorder, asthma/COPD, tremors, C3-C5 fusion, presented to ED with complaints of back pain following fall at home on 03/30/2024, confirms that he did not pass out, hit his head on hardwood floor, remained on the floor for 10 to 12 hours.  Admitted for subdural hematoma, acute T6 vertebral fracture with back pain and possible syncope.  Neurosurgery consulted in ED.   Assessment & Plan:   Fall at home, recurrent falls Possible syncope Occipital scalp superficial laceration Check orthostatic hypotension.  Getting echo and EEG. Telemetry shows sinus rhythm with BBB morphology and no arrhythmias. PT and OT consulted.  Awaiting input. Patient has been counseled that he must not drive x 6 months and he verbalized understanding.  Left convexity subdural hematoma Neurosurgery input 11/12 appreciated.  Follow-up CT head showed no increase in SDH volume. Per neurosurgery, DC home since CT stable and can restart Plavix  on 11/17.  Acute T6 vertebral fracture Several other old vertebral fractures at different levels Multimodality pain control.  Tylenol  1 g 3 times daily, tramadol did not help and patient requesting Oxy IR.  Judicious use of pain meds. TLSO brace. Outpatient follow-up with neurosurgery in 6 Saur.  Patient reports that he sees Dr. Dorn Ned  Acute kidney injury Baseline creatinine may be in the 0.6-0.7 range.  Presented with creatinine of 1.3.  Hold diuretics and culprit medications.   Brief and gentle IVF.  Follow BMP in AM.  Avoid hypotension and nephrotoxic's.  Elevated lactate Resolved after IVF.  Chronic systolic CHF, with recovered EF Reviewed recent cardiology office visit 8/28.  History of HFrEF, prior LV thrombus but anticoagulation was discontinued due to fall risk and transitioned to Plavix . 2D echo 5/22: LVEF 65 to 70% without regional wall motion abnormalities.  Grade 1 diastolic dysfunction.  No aortic stenosis. Clinically euvolemic or even slightly on the dry side.  Gentle IV fluids.  Follow repeat echo.  Anemia Leukocytosis Leukocytosis transient and resolved.  Suspected reactive.  No concern for infectious etiology Based on prior results, anemia may be at baseline but will follow CBC in a.m.  Alcohol use disorder Patient reported chronic consumption of at least 4-5 shots of whiskey daily, most recently 03/30/2024.  Blood alcohol level 19 on admission. Monitor for alcohol withdrawal.  Treat per CIWA protocol.  ICM consulted.  COPD/asthma Stable.  Anxiety/depression/bipolar disorder Lamictal  level pending, consider resuming.  Trigeminal neuralgia Tremors  Incidental findings on CT C/A/P 11/12: Fatty liver with cirrhosis changes Cholelithiasis Mild thickened appearance of mid esophagus with infiltration of surrounding fat plane may represent esophagitis-esophageal neoplasm not excluded-recommend outpatient follow-up with PCP for GI consultation and further evaluation as deemed necessary.  PPI for now.  Body mass index is 26 kg/m.   DVT prophylaxis: SCDs Start: 03/31/24 2000     Code Status: Full Code:  Family Communication: None at bedside Disposition:  Status is: Observation The patient will require care spanning > 2 midnights and should be moved to inpatient because: AKI, IVF, syncope workup.  Consultants:   Neurosurgery  Procedures:   TLSO brace  Subjective:  History as noted above.  Reports that his pain is controlled on  meds.  Insisting on discharging today despite explaining to him that it is possible that his evaluation may not be complete and we will have to wait on therapies evaluation and discharging home alone without 24/7 may not be a safe option.  He kept insisting about discharging home.  Had a second visit with patient with patient's healthcare power of attorney, Adina at bedside, updated the same concern and he verbalized understanding and agreed that patient may not be appropriate to DC today.  Objective:   Vitals:   04/01/24 0345 04/01/24 0500 04/01/24 0805 04/01/24 1147  BP: 109/72  107/65 95/61  Pulse: 79  76 75  Resp: 20  20 12   Temp: 98.8 F (37.1 C)  98.1 F (36.7 C) 98.4 F (36.9 C)  TempSrc: Oral  Oral Oral  SpO2: 97%  99% 96%  Weight:  82.2 kg    Height:        General exam: Elderly male, moderately built and nourished sitting up comfortably in reclining chair with TLSO brace on. HEENT: Superficial circular area of denuded skin over mid occipital scalp without bleeding or acute findings.  Has a dry dressing on top. Respiratory system: Clear to auscultation. Respiratory effort normal. Cardiovascular system: S1 & S2 heard, RRR. No JVD, murmurs, rubs, gallops or clicks. No pedal edema.  Telemetry personally reviewed: Sinus rhythm with BBB morphology. Gastrointestinal system: Abdomen is nondistended, soft and nontender. No organomegaly or masses felt. Normal bowel sounds heard. Central nervous system: Alert and oriented. No focal neurological deficits. Extremities: Symmetric 5 x 5 power.  Bruising on his upper extremities. Skin: No rashes, lesions or ulcers Psychiatry: Judgement and insight appear normal. Mood & affect appropriate.     Data Reviewed:   I have personally reviewed following labs and imaging studies   CBC: Recent Labs  Lab 03/31/24 1308 03/31/24 1315 04/01/24 0301  WBC 13.4*  --  9.6  NEUTROABS 10.6*  --  6.3  HGB 12.0* 12.9* 9.3*  HCT 35.5* 38.0* 27.3*  MCV  104.4*  --  103.8*  PLT 217  --  156    Basic Metabolic Panel: Recent Labs  Lab 03/31/24 1308 03/31/24 1315 03/31/24 2126 04/01/24 0301  NA 138 137  --  136  K 4.8 4.7  --  5.1  CL 98 101  --  100  CO2 22  --   --  25  GLUCOSE 103* 102*  --  106*  BUN 21 23  --  31*  CREATININE 1.19 1.30*  --  1.34*  CALCIUM 8.7*  --   --  8.3*  MG  --   --  1.9 1.8    Liver Function Tests: Recent Labs  Lab 03/31/24 1308 04/01/24 0301  AST 31 27  ALT 21 19  ALKPHOS 99 75  BILITOT 1.3* 1.2  PROT 7.5 6.5  ALBUMIN 3.7 3.2*    CBG: No results for input(s): GLUCAP in the last 168 hours.  Microbiology Studies:  No results found for this or any previous visit (from the past 240 hours).  Radiology Studies:  CT Head Wo Contrast Result Date: 03/31/2024 CLINICAL DATA:  Initial evaluation for acute head trauma. EXAM: CT HEAD WITHOUT CONTRAST TECHNIQUE: Contiguous axial images were obtained from the base of the skull through the vertex without intravenous contrast. RADIATION DOSE REDUCTION: This exam was  performed according to the departmental dose-optimization program which includes automated exposure control, adjustment of the mA and/or kV according to patient size and/or use of iterative reconstruction technique. COMPARISON:  Comparison made with CT from earlier the same day. FINDINGS: Brain: Previously identified left holo hemispheric subdural hematoma again seen. Hematoma is relatively similar in size measuring up to 6 mm in maximal diameter, but demonstrates increased hyperdense blood products since prior, consistent with a small amount of interval bleeding. No significant mass effect or midline shift. No other new acute intracranial hemorrhage. No acute large vessel territory infarct. No mass lesion. Ventricular prominence related global parenchymal volume loss of hydrocephalus. Underlying atrophy with moderately advanced chronic microvascular ischemic disease. Vascular: Small amount of IV  contrast material remains on board from prior CT. Calcified atherosclerosis present at the skull base. Skull: Left parietal scalp contusion with laceration. Calvarium intact. Sinuses/Orbits: Globes and orbital soft tissues within normal limits. Paranasal sinuses are largely clear. Mastoid air cells and middle ear cavities remain clear as well. Other: None. IMPRESSION: 1. Left holo hemispheric subdural hematoma, similar in size measuring up to 6 mm in maximal diameter, but demonstrating increased hyperdense blood products since prior, consistent with a small amount of interval bleeding. No significant mass effect or midline shift. 2. Left parietal scalp contusion with laceration. No calvarial fracture. 3. Underlying atrophy with moderately advanced chronic microvascular ischemic disease. Electronically Signed   By: Morene Hoard M.D.   On: 03/31/2024 21:14   CT CHEST ABDOMEN PELVIS W CONTRAST Result Date: 03/31/2024 CLINICAL DATA:  Trauma. EXAM: CT CHEST, ABDOMEN, AND PELVIS WITH CONTRAST TECHNIQUE: Multidetector CT imaging of the chest, abdomen and pelvis was performed following the standard protocol during bolus administration of intravenous contrast. RADIATION DOSE REDUCTION: This exam was performed according to the departmental dose-optimization program which includes automated exposure control, adjustment of the mA and/or kV according to patient size and/or use of iterative reconstruction technique. CONTRAST:  75mL OMNIPAQUE  IOHEXOL  350 MG/ML SOLN COMPARISON:  None Available. FINDINGS: CT CHEST FINDINGS Cardiovascular: There is no cardiomegaly or pericardial effusion. Mild atherosclerotic calcification of the thoracic aorta. No aneurysmal dilatation or dissection. The origins of the great vessels of the aortic arch and the central pulmonary arteries appear patent as visualized. Mediastinum/Nodes: No hilar or mediastinal adenopathy. Mild thickened appearance of the mid esophagus with infiltration of  the surrounding fat plane may represent esophagitis. Underlying esophageal neoplasm is not excluded. Esophagram or endoscopy may provide better evaluation on a nonemergent/outpatient basis. No mediastinal fluid collection. Lungs/Pleura: No focal consolidation, pleural effusion, pneumothorax. The central airways are patent. Musculoskeletal: Osteopenia with severe degenerative changes of the spine. Acute fracture of the anterior T6 vertebra extending from the anterior vertebral body cortex into the inferior endplate. There is also an age indeterminate fracture extending from the anterior T8 osteophyte into the T8 vertebra. Multiple old healed bilateral rib fractures. CT ABDOMEN PELVIS FINDINGS No intra-abdominal free air or free fluid. Hepatobiliary: Fatty liver with morphologic changes of cirrhosis. No biliary dilatation. Small gallstone. No pericholecystic fluid or evidence of acute cholecystitis by CT. Pancreas: Unremarkable. No pancreatic ductal dilatation or surrounding inflammatory changes. Spleen: Normal in size without focal abnormality. Adrenals/Urinary Tract: The adrenal glands are unremarkable. There is no hydronephrosis on either side. The visualized ureters and urinary bladder probable. Stomach/Bowel: There is sigmoid diverticulosis. There is no bowel obstruction or active inflammation. The appendix is normal. Vascular/Lymphatic: No adenopathy. Reproductive: The prostate and seminal vesicles are grossly remarkable. Other: None Musculoskeletal: Osteopenia with severe  degenerative changes of spine. Chronic appearing compression fractures of L4 and L5 with approximately 50% loss of vertebral body height. Correlation with clinical exam and point tenderness recommended. IMPRESSION: 1. Acute fracture of the anterior T6 vertebra extending from the anterior vertebral body cortex into the inferior endplate. No retropulsion. 2. Age indeterminate fracture extending from the anterior T8 osteophyte into the T8  vertebra. 3. Chronic appearing compression fractures of L4 and L5 with approximately 50% loss of vertebral body height. Correlation with clinical exam and point tenderness recommended. 4. No acute/traumatic intra-abdominal or pelvic pathology. 5. Fatty liver with morphologic changes of cirrhosis. 6. Cholelithiasis. 7. Sigmoid diverticulosis. 8. Mild thickened appearance of the mid esophagus with infiltration of the surrounding fat plane may represent esophagitis. Underlying esophageal neoplasm is not excluded. Esophagram or endoscopy may provide better evaluation on a nonemergent/outpatient basis. 9.  Aortic Atherosclerosis (ICD10-I70.0). Electronically Signed   By: Vanetta Chou M.D.   On: 03/31/2024 17:41   DG Lumbar Spine Complete Result Date: 03/31/2024 CLINICAL DATA:  Fall EXAM: LUMBAR SPINE - COMPLETE 4+ VIEW COMPARISON:  03/18/2023 FINDINGS: Stable lumbar alignment with grade 1 anterolisthesis L5 on S1 and trace retrolisthesis L1 on L2 and L2 on L3. Mild chronic superior endplate deformity at L2. Interval age indeterminate moderate superior endplate deformities at L4 and L5. Moderate disc space narrowing and degenerative change at L5-S1 and at L1-L2 and L2-L3. Multilevel facet degenerative changes. Aortic atherosclerosis IMPRESSION: 1. Interval age indeterminate moderate superior endplate deformities at L4 and L5. 2. Chronic mild superior endplate deformity at L2. 3. Multilevel degenerative changes. Electronically Signed   By: Luke Bun M.D.   On: 03/31/2024 16:05   CT Head Wo Contrast Result Date: 03/31/2024 EXAM: CT HEAD WITHOUT CONTRAST 03/31/2024 02:10:00 PM TECHNIQUE: CT of the head was performed without the administration of intravenous contrast. Automated exposure control, iterative reconstruction, and/or weight based adjustment of the mA/kV was utilized to reduce the radiation dose to as low as reasonably achievable. COMPARISON: CT head 10/23/2023. CLINICAL HISTORY: Head trauma, minor  (Age >= 65y). FINDINGS: BRAIN AND VENTRICLES: No evidence of acute infarct. No hydrocephalus. Overall similar mild parenchymal volume loss advanced for the patient's stated age. New 0.8 cm predominantly hypoattenuating left cerebral convexity extra axial fluid collection without significant mass effect or midline shift. Overall similar moderate-to-severe scattered white matter hypodensities which are nonspecific but most commonly represent chronic microvascular ischemic changes. Redemonstrated chronic left occipital lobe infarction. ORBITS: Bilateral lens replacement. SINUSES: No acute abnormality. SOFT TISSUES AND SKULL: Decreased left parietal hematoma and soft tissue swelling. Right temporomandibular joint arthrosis. Partially imaged chronic cervical spine fractures. No skull fracture. IMPRESSION: 1. Since 10/23/2023, new 0.8 cm hypoattenuating left cerebral convexity extra-axial fluid collection, without significant mass effect or midline shift, likely representing subdural hematoma, possibly acute. 2. The above finding was communicated to [facility] by phone at 3:25 PM. Electronically signed by: andrew bybordi 03/31/2024 03:27 PM EST RP Workstation: [id number]   CT Cervical Spine Wo Contrast Result Date: 03/31/2024 CLINICAL DATA:  Fall. EXAM: CT CERVICAL SPINE WITHOUT CONTRAST TECHNIQUE: Multidetector CT imaging of the cervical spine was performed without intravenous contrast. Multiplanar CT image reconstructions were also generated. RADIATION DOSE REDUCTION: This exam was performed according to the departmental dose-optimization program which includes automated exposure control, adjustment of the mA and/or kV according to patient size and/or use of iterative reconstruction technique. COMPARISON:  10/23/2023 FINDINGS: Alignment: No acute posttraumatic subluxation. Skull base and vertebrae: Moderate spondylosis throughout the cervical spine to  include uncovertebral joint spurring and facet arthropathy.  Exam demonstrates a stable non healed type 2 dens fracture with 3 mm distraction of the fracture fragments. Stable symmetric fractures of the bilateral posterior arch of C1. Stable fracture along the T1 anterior osteophyte. No new fractures identified. Anterior fusion hardware intact and unchanged at the C3-4 level. 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 These findings are stable. Bilateral neural foraminal narrowing at the C3-4 level and to lesser extent at the C4-5 level. Neural foraminal narrowing bilaterally at the C5-6 level right worse than left. Minimal right-sided neural foraminal narrowing at the C6-7 level. Soft tissues and spinal canal: Prevertebral soft tissues are unremarkable. Disc levels: Obliteration of the disc space at the C4-5 and C5-6 levels with disc space narrowing at the C6-7 and C7-T1 levels. Upper chest: No acute findings. Other: None. IMPRESSION: 1. No acute findings. 2. Stable non healed type 2 dens fracture with 3 mm distraction of the fracture fragments. Stable symmetric fractures of the bilateral posterior arch of C1. Stable fracture along the T1 anterior osteophyte. 3. Stable anterior fusion hardware at the C3-4 level. 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 4. Moderate spondylosis throughout the cervical spine with multilevel disc disease and neural foraminal narrowing as described. Electronically Signed   By: Toribio Agreste M.D.   On: 03/31/2024 14:56   DG Shoulder Left Result Date: 03/31/2024 CLINICAL DATA:  Fall.  Left shoulder pain. EXAM: LEFT SHOULDER - 2+ VIEW COMPARISON:  None Available. FINDINGS: There is no evidence of fracture or dislocation. Moderate glenohumeral osteoarthritis noted. Tiny calcification seen in the distal rotator cuff tendon. IMPRESSION: No acute findings. Moderate glenohumeral osteoarthritis. Calcific tendinopathy of rotator cuff. Electronically Signed   By: Norleen DELENA Kil M.D.   On: 03/31/2024  13:47   DG Pelvis Portable Result Date: 03/31/2024 CLINICAL DATA:  Fall.  Pelvic pain. EXAM: PORTABLE PELVIS 1-2 VIEWS COMPARISON:  07/27/2023 FINDINGS: There is no evidence of pelvic fracture or diastasis. No pelvic bone lesions are seen. Lower lumbar spine degenerative changes again noted. IMPRESSION: No acute findings. Electronically Signed   By: Norleen DELENA Kil M.D.   On: 03/31/2024 13:45   DG Chest Portable 1 View Result Date: 03/31/2024 CLINICAL DATA:  Fall last night.  Found on floor this morning. EXAM: PORTABLE CHEST 1 VIEW COMPARISON:  07/27/2023 FINDINGS: Low lung volumes are noted, and patient is partially rotated to the right. Heart size and mediastinal contours are stable. No evidence of pneumothorax or hemothorax. Mild atelectasis is seen in the lateral right lung base. Several old right rib fracture deformities are again noted. IMPRESSION: Low lung volumes with mild right basilar atelectasis. Electronically Signed   By: Norleen DELENA Kil M.D.   On: 03/31/2024 13:44    Scheduled Meds:    acetaminophen   1,000 mg Oral TID   docusate sodium   100 mg Oral BID   folic acid   1 mg Oral Daily   lamoTRIgine   200 mg Oral QHS   lidocaine   1 patch Transdermal Q24H   memantine   10 mg Oral BID   multivitamin with minerals  1 tablet Oral Daily   pantoprazole  (PROTONIX ) IV  40 mg Intravenous Q12H   pregabalin   100 mg Oral BID   primidone   100 mg Oral BID   thiamine   100 mg Oral Daily    Continuous Infusions:    lactated ringers  50 mL/hr at 04/01/24 1104     LOS: 0 days  Trenda Mar, MD,  FACP, [facility], [facility], [facility]   [facility] [place]      To contact the attending provider between 7A-7P or the covering provider during after hours 7P-7A, please log into the web site [email or web address] and access using universal [place] password for that web site. If you do not have the password, please call the hospital operator.  04/01/2024, 12:43 PM

## 2024-04-01 NOTE — Progress Notes (Signed)
 Attempted Echocardiogram, Patient refused. Patient states he knows what a Echocardiogram is and very aware of what it is for and still refused.

## 2024-04-01 NOTE — Evaluation (Signed)
 Occupational Therapy Evaluation Patient Details Name: Gabriel Richardson MRN: [id number] DOB: 1946-01-08 Today's Date: 04/01/2024   History of Present Illness   78 yo male presents to ED 11/12 s/p fall, found down by friend several hours later.  CTH shows a left convexity SDH likely subacute to chronic in nature. He also has a known stable C2 fracture with non-union. CTCAP shows an acute vertebral body fracture of T6 plus a chronic appearing fracture of T8. PMH includes: T2-3 laminectomy 2022, R ankle fx and surgical repair, anxiety, arthritis, HF, MI, alcohol abuse, opioid abuse, bipolar disorder, COPD, Emphysema, and cervical fusion C3-5.     Clinical Impressions Gabriel Richardson was evaluated s/p the above admission list. He lives alone and has assist for IADLs from PCAs at baseline. Upon evaluation the pt was limited by impaired balance, poor insight to deficits and safety, spinal precautions, spinal brace, dizziness with standing, general debility and limited activity tolerance. Overall he needed minA for transfers and mobility with RW. He also required increased time and cues for spinal precautions. Due to the deficits listed below the pt also needs up to mod A for LB ADLs and set up A for UB ADLs in sitting. Pt will benefit from continued acute OT services and HHOT (pt declined post-acute rehab stay).      If plan is discharge home, recommend the following:   A lot of help with walking and/or transfers;A lot of help with bathing/dressing/bathroom;Assistance with cooking/housework;Assist for transportation;Supervision due to cognitive status     Functional Status Assessment   Patient has had a recent decline in their functional status and demonstrates the ability to make significant improvements in function in a reasonable and predictable amount of time.     Equipment Recommendations   None recommended by OT       Mobility Bed Mobility Overal bed mobility: Needs Assistance Bed Mobility:  Rolling, Sidelying to Sit Rolling: Contact guard assist Sidelying to sit: Contact guard assist       General bed mobility comments: increased time and cues needed    Transfers Overall transfer level: Needs assistance Equipment used: Rolling walker (2 wheels) Transfers: Sit to/from Stand Sit to Stand: Min assist                  Balance Overall balance assessment: Needs assistance Sitting-balance support: Feet supported Sitting balance-Leahy Scale: Fair     Standing balance support: Bilateral upper extremity supported, During functional activity Standing balance-Leahy Scale: Poor                             ADL either performed or assessed with clinical judgement   ADL Overall ADL's : Needs assistance/impaired Eating/Feeding: Independent   Grooming: Set up;Sitting   Upper Body Bathing: Set up;Sitting   Lower Body Bathing: Moderate assistance;Sit to/from stand   Upper Body Dressing : Set up;Sitting   Lower Body Dressing: Moderate assistance;Sit to/from stand   Toilet Transfer: Minimal assistance;Ambulation;Rolling walker (2 wheels);Regular Toilet   Toileting- Clothing Manipulation and Hygiene: Minimal assistance;Sitting/lateral lean       Functional mobility during ADLs: Minimal assistance;Rolling walker (2 wheels);Cueing for safety;Cueing for sequencing General ADL Comments: maximal cues needed for spinal precuations with little to no carryover noted. pt has poor insight to safety     Vision Baseline Vision/History: 1 Wears glasses Vision Assessment?: No apparent visual deficits     Perception Perception: Not tested       Praxis  Praxis: Not tested       Pertinent Vitals/Pain Pain Assessment Pain Assessment: Faces Faces Pain Scale: Hurts even more Pain Location: back Pain Descriptors / Indicators: Discomfort, Grimacing, Guarding Pain Intervention(s): Limited activity within patient's tolerance     Extremity/Trunk Assessment Upper  Extremity Assessment Upper Extremity Assessment: Generalized weakness   Lower Extremity Assessment Lower Extremity Assessment: Defer to PT evaluation   Cervical / Trunk Assessment Cervical / Trunk Assessment: Other exceptions Cervical / Trunk Exceptions: known fractures   Communication Communication Communication: No apparent difficulties   Cognition Arousal: Alert Behavior During Therapy: WFL for tasks assessed/performed, Impulsive Cognition: No family/caregiver present to determine baseline             OT - Cognition Comments: pt unaware of his new back fracture, can be verbose and self-distracting, needed cues for safety and spinal precautions.                 Following commands: Impaired Following commands impaired: Only follows one step commands consistently, Follows multi-step commands inconsistently     Cueing  General Comments   Cueing Techniques: Verbal cues  BP dropped upon standing, not an orthostatic drop   Exercises     Shoulder Instructions      Home Living Family/patient expects to be discharged to:: Private residence Living Arrangements: Alone (pt states he has 4  helpers during the day) Available Help at Discharge: Personal care attendant;Available PRN/intermittently Type of Home: Apartment Home Access: Level entry     Home Layout: One level               Home Equipment: Agricultural Consultant (2 wheels)          Prior Functioning/Environment Prior Level of Function : Independent/Modified Independent             Mobility Comments: rollator for mobility, admits to several falls ADLs Comments: helpers assist with IADLs, he reports being mod I for ADLS    OT Problem List: Decreased strength;Decreased activity tolerance;Decreased range of motion;Impaired balance (sitting and/or standing);Decreased safety awareness;Decreased cognition;Pain   OT Treatment/Interventions: Self-care/ADL training;Therapeutic exercise;DME and/or AE  instruction;Therapeutic activities;Patient/family education;Balance training      OT Goals(Current goals can be found in the care plan section)   Acute Rehab OT Goals Patient Stated Goal: home tomorrow OT Goal Formulation: With patient Time For Goal Achievement: 04/15/24 Potential to Achieve Goals: Good ADL Goals Pt Will Perform Grooming: with modified independence;standing Pt Will Perform Upper Body Dressing: with modified independence Pt Will Perform Lower Body Dressing: sit to/from stand;with modified independence Pt Will Transfer to Toilet: ambulating;with modified independence Additional ADL Goal #1: Pt will navigate hospital environment with mod I and LRAD to demonstrate reduced fall risk   OT Frequency:  Min 2X/week    Co-evaluation              AM-PAC OT 6 Clicks Daily Activity     Outcome Measure Help from another person eating meals?: None Help from another person taking care of personal grooming?: A Little Help from another person toileting, which includes using toliet, bedpan, or urinal?: A Little Help from another person bathing (including washing, rinsing, drying)?: A Lot Help from another person to put on and taking off regular upper body clothing?: A Little Help from another person to put on and taking off regular lower body clothing?: A Lot 6 Click Score: 17   End of Session Equipment Utilized During Treatment: Rolling walker (2 wheels);Back brace Nurse Communication: Mobility status  Activity Tolerance: Patient tolerated treatment well Patient left: in bed;with call bell/phone within reach;with bed alarm set  OT Visit Diagnosis: Unsteadiness on feet (R26.81);Other abnormalities of gait and mobility (R26.89);Repeated falls (R29.6);Muscle weakness (generalized) (M62.81);History of falling (Z91.81);Pain                Time: 1131-1159 OT Time Calculation (min): 28 min Charges:  OT General Charges $OT Visit: 1 Visit OT Evaluation $OT Eval Moderate  Complexity: 1 Mod OT Treatments $Self Care/Home Management : 8-22 mins  Lucie Kendall, OTR/L Acute Rehabilitation Services Office [phone] Secure Chat Communication Preferred   Lucie JONETTA Kendall 04/01/2024, 1:48 PM

## 2024-04-01 NOTE — Progress Notes (Signed)
 PT Cancellation Note  Patient Details Name: Gabriel Richardson MRN: [id number] DOB: 05/09/1946   Cancelled Treatment:    Reason Eval/Treat Not Completed: Patient declined, no reason specified;Fatigue/lethargy limiting ability to participate - pt states I can't keep my eyes open, I can't do PT. PT offered to come back in the next hour, pt refuses stating I am staying the night, see me tomorrow. PT to follow up tomorrow per pt request.  [name], PT DPT Acute Rehabilitation Services Secure Chat Preferred  Office [phone]    [name] 04/01/2024, 3:45 PM

## 2024-04-01 NOTE — TOC CM/SW Note (Signed)
 Transition of Care (TOC) CM/SW Note   11:19 AM: MSW intern introduced self and role to patient at bedside. Notified patient on consult for substance use resources. Patient declined receiving any substance use resources/education.    Whitfield Campanile, MSW Intern

## 2024-04-02 ENCOUNTER — Other Ambulatory Visit: Payer: Self-pay | Admitting: Neurology

## 2024-04-02 ENCOUNTER — Inpatient Hospital Stay (HOSPITAL_COMMUNITY)

## 2024-04-02 DIAGNOSIS — S22059D Unspecified fracture of T5-T6 vertebra, subsequent encounter for fracture with routine healing: Secondary | ICD-10-CM | POA: Diagnosis not present

## 2024-04-02 DIAGNOSIS — R569 Unspecified convulsions: Secondary | ICD-10-CM | POA: Diagnosis not present

## 2024-04-02 DIAGNOSIS — R55 Syncope and collapse: Secondary | ICD-10-CM

## 2024-04-02 DIAGNOSIS — S065XAA Traumatic subdural hemorrhage with loss of consciousness status unknown, initial encounter: Secondary | ICD-10-CM | POA: Diagnosis not present

## 2024-04-02 LAB — ECHOCARDIOGRAM COMPLETE
Area-P 1/2: 3.65 cm2
Height: 70 in
Single Plane A4C EF: 63.4 %
Weight: 3026.47 [oz_av]

## 2024-04-02 LAB — COMPREHENSIVE METABOLIC PANEL WITH GFR
ALT: 17 U/L (ref 0–44)
AST: 28 U/L (ref 15–41)
Albumin: 3.1 g/dL — ABNORMAL LOW (ref 3.5–5.0)
Alkaline Phosphatase: 77 U/L (ref 38–126)
Anion gap: 10 (ref 5–15)
BUN: 17 mg/dL (ref 8–23)
CO2: 25 mmol/L (ref 22–32)
Calcium: 8.3 mg/dL — ABNORMAL LOW (ref 8.9–10.3)
Chloride: 99 mmol/L (ref 98–111)
Creatinine, Ser: 0.81 mg/dL (ref 0.61–1.24)
GFR, Estimated: >60 mL/min (ref >60)
Glucose, Bld: 111 mg/dL — ABNORMAL HIGH (ref 70–99)
Potassium: 4 mmol/L (ref 3.5–5.1)
Sodium: 134 mmol/L — ABNORMAL LOW (ref 135–145)
Total Bilirubin: 1.3 mg/dL — ABNORMAL HIGH (ref 0.0–1.2)
Total Protein: 6.4 g/dL — ABNORMAL LOW (ref 6.5–8.1)

## 2024-04-02 LAB — CBC
HCT: 26.2 % — ABNORMAL LOW (ref 39.0–52.0)
Hemoglobin: 9 g/dL — ABNORMAL LOW (ref 13.0–17.0)
MCH: 35.4 pg — ABNORMAL HIGH (ref 26.0–34.0)
MCHC: 34.4 g/dL (ref 30.0–36.0)
MCV: 103.1 fL — ABNORMAL HIGH (ref 80.0–100.0)
Platelets: 145 K/uL — ABNORMAL LOW (ref 150–400)
RBC: 2.54 MIL/uL — ABNORMAL LOW (ref 4.22–5.81)
RDW: 12.9 % (ref 11.5–15.5)
WBC: 9.6 K/uL (ref 4.0–10.5)
nRBC: 0 % (ref 0.0–0.2)

## 2024-04-02 LAB — LAMOTRIGINE LEVEL: Lamotrigine Lvl: 5.9 ug/mL (ref 2.0–20.0)

## 2024-04-02 MED ORDER — FENTANYL CITRATE (PF) 50 MCG/ML IJ SOSY
25.0000 ug | PREFILLED_SYRINGE | INTRAMUSCULAR | Status: DC | PRN
  Administered 2024-04-02 (×2): 25 ug via INTRAVENOUS
  Filled 2024-04-02 (×2): qty 1

## 2024-04-02 MED ORDER — OXYCODONE HCL 5 MG PO TABS: 5.0000 mg | ORAL_TABLET | Freq: Three times a day (TID) | ORAL | 0 refills | Status: AC | PRN

## 2024-04-02 MED ORDER — PANTOPRAZOLE SODIUM 40 MG PO TBEC: 40.0000 mg | DELAYED_RELEASE_TABLET | Freq: Every day | ORAL | 1 refills | Status: AC

## 2024-04-02 MED ORDER — FOLIC ACID 1 MG PO TABS: 1.0000 mg | ORAL_TABLET | Freq: Every day | ORAL | 0 refills | Status: AC

## 2024-04-02 MED ORDER — VITAMIN B-1 100 MG PO TABS: 100.0000 mg | ORAL_TABLET | Freq: Every day | ORAL | 0 refills | Status: AC

## 2024-04-02 NOTE — Evaluation (Signed)
 Physical Therapy Evaluation Patient Details Name: Gabriel Richardson MRN: [id number] DOB: Dec 03, 1945 Today's Date: 04/02/2024  History of Present Illness  78 yo male presents to ED 11/12 s/p fall, found down by friend several hours later.  CTH shows a left convexity SDH likely subacute to chronic in nature. He also has a known stable C2 fracture with non-union. CTCAP shows an acute vertebral body fracture of T6 plus a chronic appearing fracture of T8. PMH includes: T2-3 laminectomy 2022, R ankle fx and surgical repair, anxiety, arthritis, HF, MI, alcohol abuse, opioid abuse, bipolar disorder, COPD, Emphysema, and cervical fusion C3-5.  Clinical Impression   Pt presents with back and headache-type pain, impaired strength, poor standing balance with history of multiple falls, and decreased activity tolerance. Pt to benefit from acute PT to address deficits. Pt ambulated short room distance with use of RW, overall requiring close guard to light assist for mobility at this time. Pt states he has 4 girls that help him at home with IADLs, but states when he first goes home he will have them there to help him. Recommend increased support from aides and HHPT, pt declines other post-acute rehab. PT to progress mobility as tolerated, and will continue to follow acutely.          If plan is discharge home, recommend the following: A little help with walking and/or transfers;A little help with bathing/dressing/bathroom   Can travel by private vehicle        Equipment Recommendations None recommended by PT  Recommendations for Other Services       Functional Status Assessment Patient has had a recent decline in their functional status and demonstrates the ability to make significant improvements in function in a reasonable and predictable amount of time.     Precautions / Restrictions Precautions Precautions: Fall;Back Precaution Booklet Issued: No Recall of Precautions/Restrictions:  Impaired Precaution/Restrictions Comments: reviewed use of TLSO, application of TLSO, back precutions Required Braces or Orthoses: Spinal Brace Spinal Brace: Thoracolumbosacral orthotic;Applied in sitting position Restrictions Weight Bearing Restrictions Per Provider Order: No      Mobility  Bed Mobility Overal bed mobility: Needs Assistance Bed Mobility: Rolling, Sidelying to Sit Rolling: Min assist Sidelying to sit: Min assist       General bed mobility comments: assist for log roll to EOB, pt using bedrails to perform    Transfers Overall transfer level: Needs assistance Equipment used: Rolling walker (2 wheels) Transfers: Sit to/from Stand Sit to Stand: Contact guard assist           General transfer comment: for safety, slow to rise and steady. cues for correct hand placement not followed    Ambulation/Gait Ambulation/Gait assistance: Contact guard assist Gait Distance (Feet): 20 Feet Assistive device: Rolling walker (2 wheels) Gait Pattern/deviations: Step-through pattern, Decreased stride length, Trunk flexed Gait velocity: decr     General Gait Details: close guard for safety, cues for upright posture and placement in RW  Stairs            Wheelchair Mobility     Tilt Bed    Modified Rankin (Stroke Patients Only)       Balance Overall balance assessment: Needs assistance Sitting-balance support: Feet supported Sitting balance-Leahy Scale: Fair     Standing balance support: Bilateral upper extremity supported, During functional activity Standing balance-Leahy Scale: Poor Standing balance comment: reliant on external support  Pertinent Vitals/Pain Pain Assessment Pain Assessment: 0-10 Pain Score: 9  Pain Location: headache Pain Descriptors / Indicators: Headache Pain Intervention(s): Limited activity within patient's tolerance, Monitored during session, Repositioned    Home Living  Family/patient expects to be discharged to:: Private residence Living Arrangements: Alone (pt states he has 4  helpers during the day) Available Help at Discharge: Personal care attendant;Available PRN/intermittently Type of Home: Apartment Home Access: Level entry       Home Layout: One level Home Equipment: Rollator (4 wheels)      Prior Function Prior Level of Function : Independent/Modified Independent             Mobility Comments: rollator for mobility, admits to several falls ADLs Comments: helpers assist with IADLs, he reports being mod I for ADLS. they come once/week     Extremity/Trunk Assessment   Upper Extremity Assessment Upper Extremity Assessment: Defer to OT evaluation    Lower Extremity Assessment Lower Extremity Assessment: Generalized weakness    Cervical / Trunk Assessment Cervical / Trunk Assessment: Other exceptions Cervical / Trunk Exceptions: known fractures  Communication   Communication Communication: No apparent difficulties    Cognition Arousal: Alert Behavior During Therapy: WFL for tasks assessed/performed, Impulsive   PT - Cognitive impairments: No family/caregiver present to determine baseline                         Following commands: Impaired Following commands impaired: Only follows one step commands consistently, Follows multi-step commands inconsistently     Cueing Cueing Techniques: Verbal cues     General Comments      Exercises     Assessment/Plan    PT Assessment Patient needs continued PT services  PT Problem List Decreased strength;Decreased mobility;Decreased activity tolerance;Decreased balance;Decreased knowledge of use of DME;Pain;Decreased safety awareness;Decreased cognition;Decreased knowledge of precautions       PT Treatment Interventions DME instruction;Therapeutic activities;Gait training;Therapeutic exercise;Patient/family education;Balance training;Functional mobility  training;Neuromuscular re-education    PT Goals (Current goals can be found in the Care Plan section)  Acute Rehab PT Goals PT Goal Formulation: With patient Time For Goal Achievement: 04/16/24 Potential to Achieve Goals: Good    Frequency Min 2X/week     Co-evaluation               AM-PAC PT 6 Clicks Mobility  Outcome Measure Help needed turning from your back to your side while in a flat bed without using bedrails?: A Little Help needed moving from lying on your back to sitting on the side of a flat bed without using bedrails?: A Little Help needed moving to and from a bed to a chair (including a wheelchair)?: A Little Help needed standing up from a chair using your arms (e.g., wheelchair or bedside chair)?: A Little Help needed to walk in hospital room?: A Little Help needed climbing 3-5 steps with a railing? : A Lot 6 Click Score: 17    End of Session Equipment Utilized During Treatment: Back brace Activity Tolerance: Patient tolerated treatment well Patient left: in chair;with call bell/phone within reach;with chair alarm set Nurse Communication: Mobility status PT Visit Diagnosis: Other abnormalities of gait and mobility (R26.89);Muscle weakness (generalized) (M62.81)    Time: 8891-8865 PT Time Calculation (min) (ACUTE ONLY): 26 min   Charges:   PT Evaluation $PT Eval Low Complexity: 1 Low PT Treatments $Therapeutic Activity: 8-22 mins PT General Charges $$ ACUTE PT VISIT: 1 Visit  Kenzo Ozment S, PT DPT Acute Rehabilitation Services Secure Chat Preferred  Office [phone]   Gabriel Richardson 04/02/2024, 2:34 PM

## 2024-04-02 NOTE — Discharge Summary (Addendum)
 Physician Discharge Summary  Gabriel Richardson FMW:[id number] DOB: 28-Aug-1945  PCP: Aisha Harvey, MD  Admitted from: Home Discharged to: Home  Admit date: 03/31/2024 Discharge date: 04/03/2024  Recommendations for Outpatient Follow-up:    Follow-up Information     Debby Dorn KANDICE, MD Follow up.   Specialty: Neurosurgery Why: please call to arrange an appointment 6 [place] from now regarding your T6 fracutre. Thank you Contact information: [address] [place] [address] [phone]         Aisha Harvey, MD. Schedule an appointment as soon as possible for a visit in 1 week(s).   Specialty: Family Medicine Why: To be seen with repeat labs (CBC & CMP). Contact information: [address] [place] [address] [phone]                [facility] team was contacted on 11/14 and they we will arrange outpatient heart monitor and MD follow-up to further evaluate for possible syncope.  The device will be mailed home per their communication yesterday.  Home Health: Home Health Orders (From admission, onward)     Start     Ordered   04/02/24 1617  Home Health  At discharge       Question Answer Comment  To provide the following care/treatments PT   To provide the following care/treatments OT   To provide the following care/treatments RN      04/02/24 1618             Equipment/Devices: None    Discharge Condition: Improved and stable    Code Status: Full Code Diet recommendation:  Discharge Diet Orders (From admission, onward)     Start     Ordered   04/02/24 0000  Diet - low sodium heart healthy        04/02/24 1647             Discharge Diagnoses:  Principal Problem:   T6 vertebral fracture (HCC) Active Problems:   Trigeminal neuralgia   Recurrent falls   Subdural hematoma (HCC)   Lactic acidosis   Leukocytosis   Chronic alcohol abuse   History of COPD   Bipolar disorder (HCC)   Chronic diastolic CHF  (congestive heart failure) (HCC)   Chronic anemia   Brief Summary: 78 year old male, lives alone, ambulates with the help of a walker, reports that he has caregivers that intermittently help him at home but no one with him 24/7, medical history significant for alcohol use disorder, recurrent falls, chronic anemia, anxiety and depression/bipolar disorder, asthma/COPD, tremors, C3-C5 fusion, presented to ED with complaints of back pain following fall at home on 03/30/2024, confirms that he did not pass out, hit his head on hardwood floor, remained on the floor for 10 to 12 hours.  Admitted for subdural hematoma, acute T6 vertebral fracture with back pain and possible syncope.  Neurosurgery consulted in ED.   11/14 addendum Echo unchanged from prior, EEG without seizures.  PT and OT recommend home health.  However he only ambulated 20 feet with PT.  Discussed in detail with patient via RNs phone, advised him that we feel that he remains at high fall risk and it would be best if he has somebody to assist him at home 24/7.  He is trying to coordinate this with a friend.  Updated patient's HCPOA as well.  Per TOC, patient refusing any form of inpatient rehab i.e. SNF.  Subsequently late evening on 11/14, patient insisted that he wanted  to be discharged last night.  He stated that his friend Rockie will call the [employer] and that because his neuro and cardiac test were negative, he wanted to go home last night and that he could not afford to stay another night.  Patient has decision-making capacity.  Sequently patient's HCPOA was updated and he indicated that even if patient was discharged, there would not be anybody to pick him up.  Therefore patient ended up staying overnight.  11/15 Patient was picked up by friends/family and MD did not see patient.   Assessment & Plan:    Fall at home, recurrent falls Possible syncope Occipital scalp superficial laceration EEG: Suggestive of diffuse cerebral  dysfunction/encephalopathy.  No seizures or epileptiform discharges were seen. 2D echo: LVEF 70-75%.  No aortic stenosis.  No significant change from prior study 10/09/2023. On day of discharge, communicated with [facility] team to arrange for outpatient heart monitor and MD follow-up. Telemetry shows sinus rhythm with BBB morphology and no arrhythmias. Per PT and OT, home health services arranged.  Patient declined any form of inpatient rehab i.e. SNF. Patient has been counseled that he must not drive x 6 months and he verbalized understanding.   Left convexity subdural hematoma Neurosurgery input 11/12 appreciated.  Follow-up CT head showed no increase in SDH volume. Per neurosurgery, DC home since CT stable and can restart Plavix  on 11/17.   Acute T6 vertebral fracture Several other old vertebral fractures at different levels Multimodality pain control.  Continue prior home dose of Tylenol  1.5 g twice daily.  Very short supply of Oxy IR added for pain not controlled with Tylenol  and patient has been strictly warned that he must not take any of his medications with alcohol and the dangerous risks associated with that.  He verbalized understanding. TLSO brace. Outpatient follow-up with neurosurgery in 6 [place].  Patient reports that he sees Dr. Dorn Ned   Acute kidney injury Baseline creatinine may be in the 0.6-0.7 range.  Presented with creatinine of 1.3.  Held diuretics and culprit medications while here and resumed at discharge.  Brief and gentle IVF.  Resolved.  Avoid hypotension and nephrotoxic's.   Elevated lactate Resolved after IVF.   Chronic systolic CHF, with recovered EF Reviewed recent cardiology office visit 8/28.  History of HFrEF, prior LV thrombus but anticoagulation was discontinued due to fall risk and transitioned to Plavix . 2D echo 5/22: LVEF 65 to 70% without regional wall motion abnormalities.  Grade 1 diastolic dysfunction.  No aortic stenosis.  See repeat  2D echo results above. Clinically euvolemic.    Anemia, macrocytic Leukocytosis Thrombocytopenia Leukocytosis transient and resolved.  Suspected reactive.  No concern for infectious etiology Hemoglobin at baseline is probably in the 9 g range.  Dropped from 9 to 8 g per DL this morning in the absence of reported overt bleeding.?  Related to alcohol bone marrow toxic effect.  Follow CBC closely as outpatient. Thrombocytopenia may be related to alcohol use.  Overall stable.   Alcohol use disorder Patient reported chronic consumption of at least 4-5 shots of whiskey daily, most recently 03/30/2024.  Blood alcohol level 19 on admission. Monitor for alcohol withdrawal.  Treat per CIWA protocol.  ICM consulted. CIWA scores of 1.  No alcohol withdrawal.   COPD/asthma Stable.   Anxiety/depression/bipolar disorder Continue Lamictal .   Trigeminal neuralgia Tremors Continue primidone  and as needed beta-blockers  Incidental findings on CT C/A/P 11/12: Fatty liver with cirrhosis changes Cholelithiasis Mild thickened appearance of mid  esophagus with infiltration of surrounding fat plane may represent esophagitis-esophageal neoplasm not excluded-recommend outpatient follow-up with PCP for GI consultation and further evaluation as deemed necessary.  PPI for now.   Body mass index is 27.14 kg/m.       Consultants:   Neurosurgery   Procedures:   TLSO brace     Discharge Instructions  Discharge Instructions     (HEART FAILURE PATIENTS) Call MD:  Anytime you have any of the following symptoms: 1) 3 pound weight gain in 24 hours or 5 pounds in 1 week 2) shortness of breath, with or without a dry hacking cough 3) swelling in the hands, feet or stomach 4) if you have to sleep on extra pillows at night in order to breathe.   Complete by: As directed    Call MD for:   Complete by: As directed    Recurrent passing out or feeling like passing out.   Call MD for:  difficulty breathing,  headache or visual disturbances   Complete by: As directed    Call MD for:  extreme fatigue   Complete by: As directed    Call MD for:  persistant dizziness or light-headedness   Complete by: As directed    Call MD for:  persistant nausea and vomiting   Complete by: As directed    Call MD for:  severe uncontrolled pain   Complete by: As directed    Call MD for:  temperature >100.4   Complete by: As directed    Diet - low sodium heart healthy   Complete by: As directed    Discharge wound care:   Complete by: As directed    Dry dressing change daily to superficial wound on your scalp/head after shower or cleaning with mild soap and water.   Driving Restrictions   Complete by: As directed    No driving for 6 months.   Increase activity slowly   Complete by: As directed         Medication List     PAUSE taking these medications    clopidogrel  75 MG tablet Wait to take this until: April 05, 2024 Commonly known as: PLAVIX  Take 1 tablet (75 mg total) by mouth daily.       TAKE these medications    acetaminophen  500 MG tablet Commonly known as: TYLENOL  Take 1,500 mg by mouth in the morning and at bedtime.   folic acid  1 MG tablet Commonly known as: FOLVITE  Take 1 tablet (1 mg total) by mouth daily.   furosemide 40 MG tablet Commonly known as: LASIX Take 20-40 mg by mouth See admin instructions. Take 40 mg by mouth once a day and an additional 20 mg as needed for leg swelling   lamoTRIgine  200 MG tablet Commonly known as: LAMICTAL  Take 200 mg by mouth at bedtime.   memantine  10 MG tablet Commonly known as: NAMENDA  TAKE 1 TABLET BY MOUTH 2 TIMES A DAY   multivitamin with minerals Tabs tablet Take 1 tablet by mouth daily.   oxyCODONE  5 MG immediate release tablet Commonly known as: Oxy IR/ROXICODONE  Take 1 tablet (5 mg total) by mouth every 8 (eight) hours as needed for moderate pain (pain score 4-6).   pantoprazole  40 MG tablet Commonly known as:  Protonix  Take 1 tablet (40 mg total) by mouth daily.   pregabalin  100 MG capsule Commonly known as: LYRICA  TAKE 1 CAPSULE BY MOUTH 2 TIMES A DAY   primidone  50 MG tablet Commonly known as: MYSOLINE   TAKE 2 TABLETS BY MOUTH TWICE A DAY   propranolol  ER 120 MG 24 hr capsule Commonly known as: INDERAL  LA TAKE 1 CAPSULE BY MOUTH 2 TIMES A DAY OK TO TAKE EXTRA CAPSULE AS NEEDED FOR WORSENING TREMOR   spironolactone  25 MG tablet Commonly known as: ALDACTONE  Take 0.5 tablets (12.5 mg total) by mouth daily.   thiamine  100 MG tablet Commonly known as: Vitamin B-1 Take 1 tablet (100 mg total) by mouth daily.   zolpidem  12.5 MG CR tablet Commonly known as: AMBIEN  CR Take 12.5 mg by mouth at bedtime.       No Known Allergies    Procedures/Studies: ECHOCARDIOGRAM COMPLETE Result Date: 04/02/2024    ECHOCARDIOGRAM REPORT   Patient Name:   Gabriel Richardson Date of Exam: 04/02/2024 Medical Rec #:  [id number]     Height:       70.0 in Accession #:    [id number]    Weight:       189.2 lb Date of Birth:  1945-12-31     BSA:          2.038 m Patient Age:    78 years      BP:           120/64 mmHg Patient Gender: M             HR:           85 bpm. Exam Location:  Inpatient Procedure: 2D Echo, 3D Echo, Color Doppler and Cardiac Doppler (Both Spectral            and Color Flow Doppler were utilized during procedure). Indications:    Syncope R55  History:        Patient has prior history of Echocardiogram examinations, most                 recent 10/09/2023. HFrEF, hx of LV thrombus, COPD,                 Signs/Symptoms:Syncope; Risk Factors:Hypertension.  Sonographer:    Koleen Popper RDCS Referring Phys: [id number] Shalice Woodring D Jashira Cotugno  Sonographer Comments: Image acquisition challenging due to patient body habitus and Image acquisition challenging due to uncooperative patient. IMPRESSIONS  1. Left ventricular ejection fraction, by estimation, is 70 to 75%. Left ventricular ejection fraction by 3D volume is 70 %.  The left ventricle has hyperdynamic function. The left ventricle has no regional wall motion abnormalities. Indeterminate diastolic filling due to E-A fusion.  2. Right ventricular systolic function is normal. The right ventricular size is not well visualized.  3. The mitral valve is degenerative. Trivial mitral valve regurgitation. No evidence of mitral stenosis.  4. The aortic valve was not well visualized. Aortic valve regurgitation is trivial. Aortic valve sclerosis/calcification is present, without any evidence of aortic stenosis.  5. Aortic dilatation noted. There is dilatation of the aortic root, measuring 39 mm. Comparison(s): A prior study was performed on 10/09/2023. No significant change from prior study. FINDINGS  Left Ventricle: Left ventricular ejection fraction, by estimation, is 70 to 75%. Left ventricular ejection fraction by 3D volume is 70 %. The left ventricle has hyperdynamic function. The left ventricle has no regional wall motion abnormalities. The left ventricular internal cavity size was normal in size. There is no left ventricular hypertrophy. Indeterminate diastolic filling due to E-A fusion. Right Ventricle: The right ventricular size is not well visualized. Right vetricular wall thickness was not well visualized. Right ventricular systolic function is normal. Left Atrium: Left  atrial size was not assessed. Right Atrium: Right atrial size was not assessed. Pericardium: The pericardium was not well visualized. Mitral Valve: The mitral valve is degenerative in appearance. Mild mitral annular calcification. Trivial mitral valve regurgitation. No evidence of mitral valve stenosis. Tricuspid Valve: The tricuspid valve is not well visualized. Tricuspid valve regurgitation is trivial. No evidence of tricuspid stenosis. Aortic Valve: The aortic valve was not well visualized. Aortic valve regurgitation is trivial. Aortic valve sclerosis/calcification is present, without any evidence of aortic  stenosis. Pulmonic Valve: The pulmonic valve was not well visualized. Pulmonic valve regurgitation is not visualized. No evidence of pulmonic stenosis. Aorta: Aortic dilatation noted. There is dilatation of the aortic root, measuring 39 mm. Venous: The pulmonary veins were not well visualized. The inferior vena cava was not well visualized. IAS/Shunts: No atrial level shunt detected by color flow Doppler. Additional Comments: Limited study due to patient related factors.   LV Volumes (MOD) LV vol d, MOD    149.0 ml      Diastology A4C:                           LV e' medial:    7.07 cm/s LV vol s, MOD    54.5 ml       LV E/e' medial:  7.6 A4C:                           LV e' lateral:   11.70 cm/s LV SV MOD A4C:   149.0 ml      LV E/e' lateral: 4.6                                 3D Volume EF                                LV 3D EF:    Left                                             ventricul                                             ar                                             ejection                                             fraction                                             by 3D  volume is                                             70 %.                                 3D Volume EF:                                3D EF:        70 %                                LV EDV:       153 ml                                LV ESV:       45 ml                                LV SV:        108 ml RIGHT VENTRICLE RV S prime:     21.90 cm/s TAPSE (M-mode): 1.8 cm LEFT ATRIUM           Index LA Vol (A4C): 47.7 ml 23.40 ml/m  AORTIC VALVE LVOT Vmax:   131.00 cm/s LVOT Vmean:  85.600 cm/s LVOT VTI:    0.222 m  AORTA Ao Root diam: 3.87 cm MITRAL VALVE MV Area (PHT): 3.65 cm    SHUNTS MV Decel Time: 208 msec    Systemic VTI: 0.22 m MV E velocity: 54.00 cm/s MV A velocity: 77.60 cm/s MV E/A ratio:  0.70 Emeline Calender Electronically signed by Emeline Calender Signature Date/Time:  04/02/2024/3:29:19 PM    Final    EEG adult Result Date: 04/02/2024 Shelton Arlin KIDD, MD     04/02/2024  2:41 PM Patient Name: Gabriel Richardson MRN: [id number] Epilepsy Attending: Arlin KIDD Shelton Referring Physician/Provider: Judeth Trenda BIRCH, MD Date: 04/02/2024 Duration: 25.38 mins Patient history: 78 year old male with syncope.  EEG evaluate for seizure. Level of alertness: Awake AEDs during EEG study: LTG, PGN Technical aspects: This EEG study was done with scalp electrodes positioned according to the 10-20 International system of electrode placement. Electrical activity was reviewed with band pass filter of 1-70Hz , sensitivity of 7 uV/mm, display speed of 52mm/sec with a 60Hz  notched filter applied as appropriate. EEG data were recorded continuously and digitally stored.  Video monitoring was available and reviewed as appropriate. Description: EEG showed continuous generalized predominantly 6 to 7 Hz theta slowing admixed with intermittent generalized 2 to 3 Hz delta slowing.  Hyperventilation and photic stimulation were not performed.   ABNORMALITY - Continuous slow, generalized IMPRESSION: This study is suggestive of diffuse cerebral dysfunction (encephalopathy).  No seizures or epileptiform discharges were seen throughout the recording. Priyanka O Yadav   CT Head Wo Contrast Result Date: 03/31/2024 CLINICAL DATA:  Initial evaluation for acute head trauma. EXAM: CT HEAD WITHOUT CONTRAST TECHNIQUE: Contiguous axial images were obtained from the base of the skull through the vertex without intravenous contrast. RADIATION DOSE REDUCTION: This exam  was performed according to the departmental dose-optimization program which includes automated exposure control, adjustment of the mA and/or kV according to patient size and/or use of iterative reconstruction technique. COMPARISON:  Comparison made with CT from earlier the same day. FINDINGS: Brain: Previously identified left holo hemispheric subdural hematoma  again seen. Hematoma is relatively similar in size measuring up to 6 mm in maximal diameter, but demonstrates increased hyperdense blood products since prior, consistent with a small amount of interval bleeding. No significant mass effect or midline shift. No other new acute intracranial hemorrhage. No acute large vessel territory infarct. No mass lesion. Ventricular prominence related global parenchymal volume loss of hydrocephalus. Underlying atrophy with moderately advanced chronic microvascular ischemic disease. Vascular: Small amount of IV contrast material remains on board from prior CT. Calcified atherosclerosis present at the skull base. Skull: Left parietal scalp contusion with laceration. Calvarium intact. Sinuses/Orbits: Globes and orbital soft tissues within normal limits. Paranasal sinuses are largely clear. Mastoid air cells and middle ear cavities remain clear as well. Other: None. IMPRESSION: 1. Left holo hemispheric subdural hematoma, similar in size measuring up to 6 mm in maximal diameter, but demonstrating increased hyperdense blood products since prior, consistent with a small amount of interval bleeding. No significant mass effect or midline shift. 2. Left parietal scalp contusion with laceration. No calvarial fracture. 3. Underlying atrophy with moderately advanced chronic microvascular ischemic disease. Electronically Signed   By: Morene Hoard M.D.   On: 03/31/2024 21:14   CT CHEST ABDOMEN PELVIS W CONTRAST Result Date: 03/31/2024 CLINICAL DATA:  Trauma. EXAM: CT CHEST, ABDOMEN, AND PELVIS WITH CONTRAST TECHNIQUE: Multidetector CT imaging of the chest, abdomen and pelvis was performed following the standard protocol during bolus administration of intravenous contrast. RADIATION DOSE REDUCTION: This exam was performed according to the departmental dose-optimization program which includes automated exposure control, adjustment of the mA and/or kV according to patient size and/or use  of iterative reconstruction technique. CONTRAST:  75mL OMNIPAQUE  IOHEXOL  350 MG/ML SOLN COMPARISON:  None Available. FINDINGS: CT CHEST FINDINGS Cardiovascular: There is no cardiomegaly or pericardial effusion. Mild atherosclerotic calcification of the thoracic aorta. No aneurysmal dilatation or dissection. The origins of the great vessels of the aortic arch and the central pulmonary arteries appear patent as visualized. Mediastinum/Nodes: No hilar or mediastinal adenopathy. Mild thickened appearance of the mid esophagus with infiltration of the surrounding fat plane may represent esophagitis. Underlying esophageal neoplasm is not excluded. Esophagram or endoscopy may provide better evaluation on a nonemergent/outpatient basis. No mediastinal fluid collection. Lungs/Pleura: No focal consolidation, pleural effusion, pneumothorax. The central airways are patent. Musculoskeletal: Osteopenia with severe degenerative changes of the spine. Acute fracture of the anterior T6 vertebra extending from the anterior vertebral body cortex into the inferior endplate. There is also an age indeterminate fracture extending from the anterior T8 osteophyte into the T8 vertebra. Multiple old healed bilateral rib fractures. CT ABDOMEN PELVIS FINDINGS No intra-abdominal free air or free fluid. Hepatobiliary: Fatty liver with morphologic changes of cirrhosis. No biliary dilatation. Small gallstone. No pericholecystic fluid or evidence of acute cholecystitis by CT. Pancreas: Unremarkable. No pancreatic ductal dilatation or surrounding inflammatory changes. Spleen: Normal in size without focal abnormality. Adrenals/Urinary Tract: The adrenal glands are unremarkable. There is no hydronephrosis on either side. The visualized ureters and urinary bladder probable. Stomach/Bowel: There is sigmoid diverticulosis. There is no bowel obstruction or active inflammation. The appendix is normal. Vascular/Lymphatic: No adenopathy. Reproductive: The  prostate and seminal vesicles are grossly remarkable. Other: None Musculoskeletal: Osteopenia  with severe degenerative changes of spine. Chronic appearing compression fractures of L4 and L5 with approximately 50% loss of vertebral body height. Correlation with clinical exam and point tenderness recommended. IMPRESSION: 1. Acute fracture of the anterior T6 vertebra extending from the anterior vertebral body cortex into the inferior endplate. No retropulsion. 2. Age indeterminate fracture extending from the anterior T8 osteophyte into the T8 vertebra. 3. Chronic appearing compression fractures of L4 and L5 with approximately 50% loss of vertebral body height. Correlation with clinical exam and point tenderness recommended. 4. No acute/traumatic intra-abdominal or pelvic pathology. 5. Fatty liver with morphologic changes of cirrhosis. 6. Cholelithiasis. 7. Sigmoid diverticulosis. 8. Mild thickened appearance of the mid esophagus with infiltration of the surrounding fat plane may represent esophagitis. Underlying esophageal neoplasm is not excluded. Esophagram or endoscopy may provide better evaluation on a nonemergent/outpatient basis. 9.  Aortic Atherosclerosis (ICD10-I70.0). Electronically Signed   By: Vanetta Chou M.D.   On: 03/31/2024 17:41   DG Lumbar Spine Complete Result Date: 03/31/2024 CLINICAL DATA:  Fall EXAM: LUMBAR SPINE - COMPLETE 4+ VIEW COMPARISON:  03/18/2023 FINDINGS: Stable lumbar alignment with grade 1 anterolisthesis L5 on S1 and trace retrolisthesis L1 on L2 and L2 on L3. Mild chronic superior endplate deformity at L2. Interval age indeterminate moderate superior endplate deformities at L4 and L5. Moderate disc space narrowing and degenerative change at L5-S1 and at L1-L2 and L2-L3. Multilevel facet degenerative changes. Aortic atherosclerosis IMPRESSION: 1. Interval age indeterminate moderate superior endplate deformities at L4 and L5. 2. Chronic mild superior endplate deformity at L2. 3.  Multilevel degenerative changes. Electronically Signed   By: Luke Bun M.D.   On: 03/31/2024 16:05   CT Head Wo Contrast Result Date: 03/31/2024 EXAM: CT HEAD WITHOUT CONTRAST 03/31/2024 02:10:00 PM TECHNIQUE: CT of the head was performed without the administration of intravenous contrast. Automated exposure control, iterative reconstruction, and/or weight based adjustment of the mA/kV was utilized to reduce the radiation dose to as low as reasonably achievable. COMPARISON: CT head 10/23/2023. CLINICAL HISTORY: Head trauma, minor (Age >= 65y). FINDINGS: BRAIN AND VENTRICLES: No evidence of acute infarct. No hydrocephalus. Overall similar mild parenchymal volume loss advanced for the patient's stated age. New 0.8 cm predominantly hypoattenuating left cerebral convexity extra axial fluid collection without significant mass effect or midline shift. Overall similar moderate-to-severe scattered white matter hypodensities which are nonspecific but most commonly represent chronic microvascular ischemic changes. Redemonstrated chronic left occipital lobe infarction. ORBITS: Bilateral lens replacement. SINUSES: No acute abnormality. SOFT TISSUES AND SKULL: Decreased left parietal hematoma and soft tissue swelling. Right temporomandibular joint arthrosis. Partially imaged chronic cervical spine fractures. No skull fracture. IMPRESSION: 1. Since 10/23/2023, new 0.8 cm hypoattenuating left cerebral convexity extra-axial fluid collection, without significant mass effect or midline shift, likely representing subdural hematoma, possibly acute. 2. The above finding was communicated to [facility] by phone at 3:25 PM. Electronically signed by: andrew bybordi 03/31/2024 03:27 PM EST RP Workstation: [id number]   CT Cervical Spine Wo Contrast Result Date: 03/31/2024 CLINICAL DATA:  Fall. EXAM: CT CERVICAL SPINE WITHOUT CONTRAST TECHNIQUE: Multidetector CT imaging of the cervical spine was performed without intravenous  contrast. Multiplanar CT image reconstructions were also generated. RADIATION DOSE REDUCTION: This exam was performed according to the departmental dose-optimization program which includes automated exposure control, adjustment of the mA and/or kV according to patient size and/or use of iterative reconstruction technique. COMPARISON:  10/23/2023 FINDINGS: Alignment: No acute posttraumatic subluxation. Skull base and vertebrae: Moderate spondylosis throughout the cervical  spine to include uncovertebral joint spurring and facet arthropathy. Exam demonstrates a stable non healed type 2 dens fracture with 3 mm distraction of the fracture fragments. Stable symmetric fractures of the bilateral posterior arch of C1. Stable fracture along the T1 anterior osteophyte. No new fractures identified. Anterior fusion hardware intact and unchanged at the C3-4 level. 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 These findings are stable. Bilateral neural foraminal narrowing at the C3-4 level and to lesser extent at the C4-5 level. Neural foraminal narrowing bilaterally at the C5-6 level right worse than left. Minimal right-sided neural foraminal narrowing at the C6-7 level. Soft tissues and spinal canal: Prevertebral soft tissues are unremarkable. Disc levels: Obliteration of the disc space at the C4-5 and C5-6 levels with disc space narrowing at the C6-7 and C7-T1 levels. Upper chest: No acute findings. Other: None. IMPRESSION: 1. No acute findings. 2. Stable non healed type 2 dens fracture with 3 mm distraction of the fracture fragments. Stable symmetric fractures of the bilateral posterior arch of C1. Stable fracture along the T1 anterior osteophyte. 3. Stable anterior fusion hardware at the C3-4 level. 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 4. Moderate spondylosis throughout the cervical spine with multilevel disc disease and neural foraminal narrowing as described.  Electronically Signed   By: Toribio Agreste M.D.   On: 03/31/2024 14:56   DG Shoulder Left Result Date: 03/31/2024 CLINICAL DATA:  Fall.  Left shoulder pain. EXAM: LEFT SHOULDER - 2+ VIEW COMPARISON:  None Available. FINDINGS: There is no evidence of fracture or dislocation. Moderate glenohumeral osteoarthritis noted. Tiny calcification seen in the distal rotator cuff tendon. IMPRESSION: No acute findings. Moderate glenohumeral osteoarthritis. Calcific tendinopathy of rotator cuff. Electronically Signed   By: Norleen DELENA Kil M.D.   On: 03/31/2024 13:47   DG Pelvis Portable Result Date: 03/31/2024 CLINICAL DATA:  Fall.  Pelvic pain. EXAM: PORTABLE PELVIS 1-2 VIEWS COMPARISON:  07/27/2023 FINDINGS: There is no evidence of pelvic fracture or diastasis. No pelvic bone lesions are seen. Lower lumbar spine degenerative changes again noted. IMPRESSION: No acute findings. Electronically Signed   By: Norleen DELENA Kil M.D.   On: 03/31/2024 13:45   DG Chest Portable 1 View Result Date: 03/31/2024 CLINICAL DATA:  Fall last night.  Found on floor this morning. EXAM: PORTABLE CHEST 1 VIEW COMPARISON:  07/27/2023 FINDINGS: Low lung volumes are noted, and patient is partially rotated to the right. Heart size and mediastinal contours are stable. No evidence of pneumothorax or hemothorax. Mild atelectasis is seen in the lateral right lung base. Several old right rib fracture deformities are again noted. IMPRESSION: Low lung volumes with mild right basilar atelectasis. Electronically Signed   By: Norleen DELENA Kil M.D.   On: 03/31/2024 13:44      Subjective: 11/14: Seen this morning. EEG tech at bedside performing EEG. Patient denied complaints. Specifically no pain reported. Maybe a little confused which had cleared up by the time I spoke with him later this afternoon.  Patient quite coherent and able to make medical decisions for himself.    Discharge Exam:  Vitals:   04/02/24 2019 04/02/24 2100 04/02/24 2300 04/03/24 0300   BP:  (!) 134/57 (!) 169/76 (!) 107/59  Pulse:   93 89  Resp:  17 (!) 27 13  Temp: 99 F (37.2 C)  99.1 F (37.3 C)   TempSrc: (P) Oral  Oral   SpO2:      Weight:  Height:       General exam: Elderly male, moderately built and nourished lying comfortably propped up in bed without distress.  Oral mucosa moist. HEENT: Superficial circular area of denuded skin over mid occipital scalp without bleeding or acute findings.  Has a dry dressing on top. Respiratory system: Clear to auscultation.  No increased work of breathing. Cardiovascular system: S1 & S2 heard, RRR. No JVD, murmurs, rubs, gallops or clicks. No pedal edema.  Telemetry personally reviewed: Sinus rhythm with BBB morphology. Gastrointestinal system: Abdomen is nondistended, soft and nontender. No organomegaly or masses felt. Normal bowel sounds heard. Central nervous system: Alert and oriented. No focal neurological deficits.  Maybe a little confused. Extremities: Symmetric 5 x 5 power.  Bruising on his upper extremities. Skin: No rashes, lesions or ulcers Psychiatry: Judgement and insight appear normal. Mood & affect appropriate.     The results of significant diagnostics from this hospitalization (including imaging, microbiology, ancillary and laboratory) are listed below for reference.     Microbiology: No results found for this or any previous visit (from the past 240 hours).   Labs: CBC: Recent Labs  Lab 03/31/24 1308 03/31/24 1315 04/01/24 0301 04/02/24 0508 04/03/24 0317  WBC 13.4*  --  9.6 9.6 11.0*  NEUTROABS 10.6*  --  6.3  --   --   HGB 12.0* 12.9* 9.3* 9.0* 8.0*  HCT 35.5* 38.0* 27.3* 26.2* 23.4*  MCV 104.4*  --  103.8* 103.1* 103.5*  PLT 217  --  156 145* 136*    Basic Metabolic Panel: Recent Labs  Lab 03/31/24 1308 03/31/24 1315 03/31/24 2126 04/01/24 0301 04/02/24 0508 04/03/24 0317  NA 138 137  --  136 134* 132*  K 4.8 4.7  --  5.1 4.0 3.9  CL 98 101  --  100 99 97*  CO2 22  --    --  25 25 25   GLUCOSE 103* 102*  --  106* 111* 91  BUN 21 23  --  31* 17 11  CREATININE 1.19 1.30*  --  1.34* 0.81 0.71  CALCIUM 8.7*  --   --  8.3* 8.3* 8.1*  MG  --   --  1.9 1.8  --   --     Liver Function Tests: Recent Labs  Lab 03/31/24 1308 04/01/24 0301 04/02/24 0508 04/03/24 0317  AST 31 27 28 24   ALT 21 19 17 17   ALKPHOS 99 75 77 69  BILITOT 1.3* 1.2 1.3* 1.6*  PROT 7.5 6.5 6.4* 6.0*  ALBUMIN 3.7 3.2* 3.1* 2.9*      Anemia work up Recent Labs    03/31/24 2126  FOLATE >20.0     Discussed with HCPOA couple of times on 11/14, updated care and answered all questions.  Time coordinating discharge: 40 minutes  SIGNED:  Trenda Mar, MD,  FACP, [facility], [facility], [facility]   [facility] [place]     To contact the attending provider between 7A-7P or the covering provider during after hours 7P-7A, please log into the web site [email or web address] and access using universal [place] password for that web site. If you do not have the password, please call the hospital operator.

## 2024-04-02 NOTE — Plan of Care (Signed)

## 2024-04-02 NOTE — Hospital Course (Signed)
 After patient DC'ed home, A1C returned > 15.  Called and updated patient.  Starting metformin 500 mg twice daily.  Sent in prescriptions for same, glucose testing supplies and ambulatory referral to endocrinology  Advised him to try and see if he can call the new PCPs office for an early appointment, or see a new PCP earlier or go to an urgent care.  He verbalized understanding.  Trenda Mar, MD,  FACP, [facility], [facility], [facility]   [facility] [place]     To contact the attending provider between 7A-7P or the covering provider during after hours 7P-7A, please log into the web site [email or web address] and access using universal [place] password for that web site. If you do not have the password, please call the hospital operator.

## 2024-04-02 NOTE — TOC Initial Note (Signed)
 Transition of Care [facility]) - Initial/Assessment Note    Patient Details  Name: Gabriel Richardson MRN: [id number] Date of Birth: 03-12-1946  [facility] CM/SW Contact:    Kalynn Declercq M, RN Phone Number: 04/02/2024, 4:27 PM  Clinical Narrative:                 78 yo male presents to ED 11/12 s/p fall, found down by friend several hours later. CTH shows a left convexity SDH likely subacute to chronic in nature. He also has a known stable C2 fracture with non-union. CTCAP shows an acute vertebral body fracture of T6 plus a chronic appearing fracture of T8.  PTA, pt independent and living at home alone with private duty care and friends to assist.  Extensive conversation with patient's POA Adina Maffucci and assistant Izetta Beck.  We discussed home set up for patient: he has multiple paid caregivers who check on him throughout the day, and provide cleaning, meal preparation and trips to grocery store. He has had home health in the past through [facility], and POA/asst feel that patient would benefit from further follow up .  Will arrange [facility], PT, and OT as ordered; he has all needed DME at home.  Izetta states she orders [employer] for him on a daily basis.  They do have concerns about patient being discharged on a large amt of pain medication, as patient mixes the meds with ETOH, and they feel this has contributed to his multiple falls. Discussed this concern with attending physician.    Please notify Izetta Beck upon discharge for transport home: [phone]  Expected Discharge Plan: Home w Home Health Services Barriers to Discharge: Barriers Resolved   Patient Goals and CMS Choice   CMS Medicare.gov Compare Post Acute Care list provided to:: Patient Represenative (must comment) (POA) Choice offered to / list presented to : [facility] POA / Guardian      Expected Discharge Plan and Services   Discharge Planning Services: CM Consult Post Acute Care Choice: Home Health Living  arrangements for the past 2 months: Single Family Home                           HH Arranged: PT, OT, RN [facility] Agency: [facility] Date [facility] Agency Contacted: 04/02/24 Time HH Agency Contacted: 1626 Representative spoke with at [facility] Agency: Arna  Prior Living Arrangements/Services Living arrangements for the past 2 months: Single Family Home Lives with:: Self Patient language and need for interpreter reviewed:: Yes Do you feel safe going back to the place where you live?: Yes      Need for Family Participation in Patient Care: Yes (Comment) Care giver support system in place?: Yes (comment) Current home services: DME Criminal Activity/Legal Involvement Pertinent to Current Situation/Hospitalization: No - Comment as needed       Permission Sought/Granted      Share Information with NAME: Blake,Matt (Friend)  [phone] (Mobile)  Permission granted to share info w AGENCY: [facility]  Permission granted to share info w Relationship: POA     Emotional Assessment Appearance:: Appears stated age   Affect (typically observed): Appropriate Orientation: : Oriented to Self, Oriented to Place, Oriented to  Time, Oriented to Situation      Admission diagnosis:  Subdural hematoma (HCC) [S06.5XAA] T6 vertebral fracture (HCC) [S22.059A] Injury of head, initial encounter [S09.90XA] Closed wedge compression fracture of T6 vertebra, initial encounter (HCC) [S22.050A] Closed wedge compression fracture of T8  vertebra, initial encounter [facility]) [S22.060A] Patient Active Problem List   Diagnosis Date Noted   Subdural hematoma (HCC) 04/01/2024   Lactic acidosis 04/01/2024   Leukocytosis 04/01/2024   Chronic alcohol abuse 04/01/2024   History of COPD 04/01/2024   Bipolar disorder (HCC) 04/01/2024   Chronic diastolic CHF (congestive heart failure) (HCC) 04/01/2024   Chronic anemia 04/01/2024   T6 vertebral fracture (HCC) 03/31/2024   Left ventricular apical thrombus 09/08/2023    Recurrent falls 09/08/2023   HFrEF (heart failure with reduced ejection fraction) (HCC) 09/08/2023   Syncope 07/27/2023   Thoracic myelopathy 08/08/2020   Closed fracture of right distal fibula 01/07/2019   Trigeminal neuralgia 07/31/2015   Benign essential tremor 07/31/2015   Ptosis 12/14/2012   Tremor 12/14/2012   Depression 11/02/2012   Asthma, chronic 11/02/2012   HYPERTENSION, BENIGN 01/15/2009   ABNORMAL ELECTROCARDIOGRAM 01/12/2009   PCP:  Aisha Harvey, MD Pharmacy:   [facility] [id number] [place], [place] - [address] [place] [address] Phone: [phone] Fax: [phone]  [facility] [id number] [place], [place] - [address] [address] Phone: [phone] Fax: [phone]  [facility] [id number] [place], [place] - [address] [place] [address] Phone: [phone] Fax: [phone]     Social Drivers of Health (SDOH) Social History: SDOH Screenings   Food Insecurity: No Food Insecurity (10/01/2023)  Housing: Low Risk  (10/01/2023)  Transportation Needs: No Transportation Needs (10/01/2023)  Utilities: Not At Risk (10/01/2023)  Alcohol Screen: Medium Risk (10/15/2023)  Depression (PHQ2-9): Low Risk  (10/01/2023)  Financial Resource Strain: Low Risk  (09/10/2022)   Received from [facility]  Tobacco Use: Medium Risk (03/31/2024)   SDOH Interventions:  No needs   Readmission Risk Interventions     No data to display         Mliss MICAEL Fass, RN, BSN  Trauma/Neuro ICU Case Manager [phone]

## 2024-04-02 NOTE — Progress Notes (Addendum)
 PROGRESS NOTE   Gabriel Richardson  FMW:[id number]    DOB: 1946-03-04    DOA: 03/31/2024  PCP: Aisha Harvey, MD   I have briefly reviewed patients previous medical records in [facility].   Brief Hospital Course:  78 year old male, lives alone, ambulates with the help of a walker, reports that he has caregivers that intermittently help him at home but no one with him 24/7, medical history significant for alcohol use disorder, recurrent falls, chronic anemia, anxiety and depression/bipolar disorder, asthma/COPD, tremors, C3-C5 fusion, presented to ED with complaints of back pain following fall at home on 03/30/2024, confirms that he did not pass out, hit his head on hardwood floor, remained on the floor for 10 to 12 hours.  Admitted for subdural hematoma, acute T6 vertebral fracture with back pain and possible syncope.  Neurosurgery consulted in ED.  Addendum Echo unchanged from prior, EEG without seizures.  PT and OT recommend home health.  However he only ambulated 20 feet with PT.  Discussed in detail with patient via RNs phone, advised him that we feel that he remains at high fall risk and it would be best if he has somebody to assist him at home 24/7.  He is trying to coordinate this with a friend.  Updated patient's HCPOA as well.   Assessment & Plan:   Fall at home, recurrent falls Possible syncope Occipital scalp superficial laceration Check orthostatic hypotension, ordered and discussed with therapy but not done. Patient declined echo yesterday but willing to get it done today, yet to be done.  EEG completed, results pending. Telemetry shows sinus rhythm with BBB morphology and no arrhythmias. OT recommends home health OT, PT input appreciated.  With OT, patient apparently declined postacute rehab stay. Patient has been counseled that he must not drive x 6 months and he verbalized understanding.  Left convexity subdural hematoma Neurosurgery input 11/12 appreciated.  Follow-up  CT head showed no increase in SDH volume. Per neurosurgery, DC home since CT stable and can restart Plavix  on 11/17.  Acute T6 vertebral fracture Several other old vertebral fractures at different levels Multimodality pain control.  Tylenol  1 g 3 times daily, tramadol did not help and patient requesting Oxy IR.  Judicious use of pain meds. TLSO brace. Outpatient follow-up with neurosurgery in 6 [place].  Patient reports that he sees Dr. Dorn Ned Despite scheduled Tylenol  1 g 3 times daily, has received couple doses of Oxy IR and fentanyl  yesterday and today.  Acute kidney injury Baseline creatinine may be in the 0.6-0.7 range.  Presented with creatinine of 1.3.  Hold diuretics and culprit medications.  Brief and gentle IVF.  Resolved.  Avoid hypotension and nephrotoxic's.  Elevated lactate Resolved after IVF.  Chronic systolic CHF, with recovered EF Reviewed recent cardiology office visit 8/28.  History of HFrEF, prior LV thrombus but anticoagulation was discontinued due to fall risk and transitioned to Plavix . 2D echo 5/22: LVEF 65 to 70% without regional wall motion abnormalities.  Grade 1 diastolic dysfunction.  No aortic stenosis. Clinically euvolemic.  Follow repeat 2D echo if patient allows.  Anemia, macrocytic Leukocytosis Thrombocytopenia Leukocytosis transient and resolved.  Suspected reactive.  No concern for infectious etiology Based on prior results, anemia may be at baseline.  Hemoglobin stable in the low 9 g range. Thrombocytopenia may be related to alcohol use.  Alcohol use disorder Patient reported chronic consumption of at least 4-5 shots of whiskey daily, most recently 03/30/2024.  Blood alcohol level 19 on admission. Monitor  for alcohol withdrawal.  Treat per CIWA protocol.  ICM consulted. CIWA scores of 1.  No alcohol withdrawal.  COPD/asthma Stable.  Anxiety/depression/bipolar disorder Continue Lamictal .  Trigeminal neuralgia Tremors  Incidental  findings on CT C/A/P 11/12: Fatty liver with cirrhosis changes Cholelithiasis Mild thickened appearance of mid esophagus with infiltration of surrounding fat plane may represent esophagitis-esophageal neoplasm not excluded-recommend outpatient follow-up with PCP for GI consultation and further evaluation as deemed necessary.  PPI for now.  Body mass index is 27.14 kg/m.   DVT prophylaxis: SCDs Start: 03/31/24 2000     Code Status: Full Code:  Family Communication: On 11/13, discussed in detail with patient's healthcare power of attorney Matt at bedside. Disposition:  Inpatient appropriate.  Awaiting echo, EEG, PT evaluation.  Patient reports that he has someone trying to arrange 24/7 person to stay with him at home.     Consultants:   Neurosurgery  Procedures:   TLSO brace  Subjective:  Seen this morning.  EEG tech at bedside performing EEG.  Patient denied complaints.  Specifically no pain reported.  Maybe a little confused.  Objective:   Vitals:   04/02/24 0300 04/02/24 0500 04/02/24 0735 04/02/24 1126  BP: 106/69  (!) 143/74 120/64  Pulse: 74  88 80  Resp: 15     Temp: 98.5 F (36.9 C)   98.2 F (36.8 C)  TempSrc: Oral   Oral  SpO2:      Weight:  85.8 kg    Height:        General exam: Elderly male, moderately built and nourished lying comfortably propped up in bed without distress.  Oral mucosa moist. HEENT: Superficial circular area of denuded skin over mid occipital scalp without bleeding or acute findings.  Has a dry dressing on top. Respiratory system: Clear to auscultation.  No increased work of breathing. Cardiovascular system: S1 & S2 heard, RRR. No JVD, murmurs, rubs, gallops or clicks. No pedal edema.  Telemetry personally reviewed: Sinus rhythm with BBB morphology. Gastrointestinal system: Abdomen is nondistended, soft and nontender. No organomegaly or masses felt. Normal bowel sounds heard. Central nervous system: Alert and oriented. No focal neurological  deficits.  Maybe a little confused. Extremities: Symmetric 5 x 5 power.  Bruising on his upper extremities. Skin: No rashes, lesions or ulcers Psychiatry: Judgement and insight appear normal. Mood & affect appropriate.     Data Reviewed:   I have personally reviewed following labs and imaging studies   CBC: Recent Labs  Lab 03/31/24 1308 03/31/24 1315 04/01/24 0301 04/02/24 0508  WBC 13.4*  --  9.6 9.6  NEUTROABS 10.6*  --  6.3  --   HGB 12.0* 12.9* 9.3* 9.0*  HCT 35.5* 38.0* 27.3* 26.2*  MCV 104.4*  --  103.8* 103.1*  PLT 217  --  156 145*    Basic Metabolic Panel: Recent Labs  Lab 03/31/24 1308 03/31/24 1315 03/31/24 2126 04/01/24 0301 04/02/24 0508  NA 138 137  --  136 134*  K 4.8 4.7  --  5.1 4.0  CL 98 101  --  100 99  CO2 22  --   --  25 25  GLUCOSE 103* 102*  --  106* 111*  BUN 21 23  --  31* 17  CREATININE 1.19 1.30*  --  1.34* 0.81  CALCIUM 8.7*  --   --  8.3* 8.3*  MG  --   --  1.9 1.8  --     Liver Function Tests: Recent Labs  Lab 03/31/24 1308 04/01/24 0301 04/02/24 0508  AST 31 27 28   ALT 21 19 17   ALKPHOS 99 75 77  BILITOT 1.3* 1.2 1.3*  PROT 7.5 6.5 6.4*  ALBUMIN 3.7 3.2* 3.1*    CBG: No results for input(s): GLUCAP in the last 168 hours.  Microbiology Studies:  No results found for this or any previous visit (from the past 240 hours).  Radiology Studies:  CT Head Wo Contrast Result Date: 03/31/2024 CLINICAL DATA:  Initial evaluation for acute head trauma. EXAM: CT HEAD WITHOUT CONTRAST TECHNIQUE: Contiguous axial images were obtained from the base of the skull through the vertex without intravenous contrast. RADIATION DOSE REDUCTION: This exam was performed according to the departmental dose-optimization program which includes automated exposure control, adjustment of the mA and/or kV according to patient size and/or use of iterative reconstruction technique. COMPARISON:  Comparison made with CT from earlier the same day. FINDINGS:  Brain: Previously identified left holo hemispheric subdural hematoma again seen. Hematoma is relatively similar in size measuring up to 6 mm in maximal diameter, but demonstrates increased hyperdense blood products since prior, consistent with a small amount of interval bleeding. No significant mass effect or midline shift. No other new acute intracranial hemorrhage. No acute large vessel territory infarct. No mass lesion. Ventricular prominence related global parenchymal volume loss of hydrocephalus. Underlying atrophy with moderately advanced chronic microvascular ischemic disease. Vascular: Small amount of IV contrast material remains on board from prior CT. Calcified atherosclerosis present at the skull base. Skull: Left parietal scalp contusion with laceration. Calvarium intact. Sinuses/Orbits: Globes and orbital soft tissues within normal limits. Paranasal sinuses are largely clear. Mastoid air cells and middle ear cavities remain clear as well. Other: None. IMPRESSION: 1. Left holo hemispheric subdural hematoma, similar in size measuring up to 6 mm in maximal diameter, but demonstrating increased hyperdense blood products since prior, consistent with a small amount of interval bleeding. No significant mass effect or midline shift. 2. Left parietal scalp contusion with laceration. No calvarial fracture. 3. Underlying atrophy with moderately advanced chronic microvascular ischemic disease. Electronically Signed   By: Morene Hoard M.D.   On: 03/31/2024 21:14   CT CHEST ABDOMEN PELVIS W CONTRAST Result Date: 03/31/2024 CLINICAL DATA:  Trauma. EXAM: CT CHEST, ABDOMEN, AND PELVIS WITH CONTRAST TECHNIQUE: Multidetector CT imaging of the chest, abdomen and pelvis was performed following the standard protocol during bolus administration of intravenous contrast. RADIATION DOSE REDUCTION: This exam was performed according to the departmental dose-optimization program which includes automated exposure  control, adjustment of the mA and/or kV according to patient size and/or use of iterative reconstruction technique. CONTRAST:  75mL OMNIPAQUE  IOHEXOL  350 MG/ML SOLN COMPARISON:  None Available. FINDINGS: CT CHEST FINDINGS Cardiovascular: There is no cardiomegaly or pericardial effusion. Mild atherosclerotic calcification of the thoracic aorta. No aneurysmal dilatation or dissection. The origins of the great vessels of the aortic arch and the central pulmonary arteries appear patent as visualized. Mediastinum/Nodes: No hilar or mediastinal adenopathy. Mild thickened appearance of the mid esophagus with infiltration of the surrounding fat plane may represent esophagitis. Underlying esophageal neoplasm is not excluded. Esophagram or endoscopy may provide better evaluation on a nonemergent/outpatient basis. No mediastinal fluid collection. Lungs/Pleura: No focal consolidation, pleural effusion, pneumothorax. The central airways are patent. Musculoskeletal: Osteopenia with severe degenerative changes of the spine. Acute fracture of the anterior T6 vertebra extending from the anterior vertebral body cortex into the inferior endplate. There is also an age indeterminate fracture extending from the anterior T8  osteophyte into the T8 vertebra. Multiple old healed bilateral rib fractures. CT ABDOMEN PELVIS FINDINGS No intra-abdominal free air or free fluid. Hepatobiliary: Fatty liver with morphologic changes of cirrhosis. No biliary dilatation. Small gallstone. No pericholecystic fluid or evidence of acute cholecystitis by CT. Pancreas: Unremarkable. No pancreatic ductal dilatation or surrounding inflammatory changes. Spleen: Normal in size without focal abnormality. Adrenals/Urinary Tract: The adrenal glands are unremarkable. There is no hydronephrosis on either side. The visualized ureters and urinary bladder probable. Stomach/Bowel: There is sigmoid diverticulosis. There is no bowel obstruction or active inflammation. The  appendix is normal. Vascular/Lymphatic: No adenopathy. Reproductive: The prostate and seminal vesicles are grossly remarkable. Other: None Musculoskeletal: Osteopenia with severe degenerative changes of spine. Chronic appearing compression fractures of L4 and L5 with approximately 50% loss of vertebral body height. Correlation with clinical exam and point tenderness recommended. IMPRESSION: 1. Acute fracture of the anterior T6 vertebra extending from the anterior vertebral body cortex into the inferior endplate. No retropulsion. 2. Age indeterminate fracture extending from the anterior T8 osteophyte into the T8 vertebra. 3. Chronic appearing compression fractures of L4 and L5 with approximately 50% loss of vertebral body height. Correlation with clinical exam and point tenderness recommended. 4. No acute/traumatic intra-abdominal or pelvic pathology. 5. Fatty liver with morphologic changes of cirrhosis. 6. Cholelithiasis. 7. Sigmoid diverticulosis. 8. Mild thickened appearance of the mid esophagus with infiltration of the surrounding fat plane may represent esophagitis. Underlying esophageal neoplasm is not excluded. Esophagram or endoscopy may provide better evaluation on a nonemergent/outpatient basis. 9.  Aortic Atherosclerosis (ICD10-I70.0). Electronically Signed   By: Vanetta Chou M.D.   On: 03/31/2024 17:41   DG Lumbar Spine Complete Result Date: 03/31/2024 CLINICAL DATA:  Fall EXAM: LUMBAR SPINE - COMPLETE 4+ VIEW COMPARISON:  03/18/2023 FINDINGS: Stable lumbar alignment with grade 1 anterolisthesis L5 on S1 and trace retrolisthesis L1 on L2 and L2 on L3. Mild chronic superior endplate deformity at L2. Interval age indeterminate moderate superior endplate deformities at L4 and L5. Moderate disc space narrowing and degenerative change at L5-S1 and at L1-L2 and L2-L3. Multilevel facet degenerative changes. Aortic atherosclerosis IMPRESSION: 1. Interval age indeterminate moderate superior endplate  deformities at L4 and L5. 2. Chronic mild superior endplate deformity at L2. 3. Multilevel degenerative changes. Electronically Signed   By: Luke Bun M.D.   On: 03/31/2024 16:05   CT Head Wo Contrast Result Date: 03/31/2024 EXAM: CT HEAD WITHOUT CONTRAST 03/31/2024 02:10:00 PM TECHNIQUE: CT of the head was performed without the administration of intravenous contrast. Automated exposure control, iterative reconstruction, and/or weight based adjustment of the mA/kV was utilized to reduce the radiation dose to as low as reasonably achievable. COMPARISON: CT head 10/23/2023. CLINICAL HISTORY: Head trauma, minor (Age >= 65y). FINDINGS: BRAIN AND VENTRICLES: No evidence of acute infarct. No hydrocephalus. Overall similar mild parenchymal volume loss advanced for the patient's stated age. New 0.8 cm predominantly hypoattenuating left cerebral convexity extra axial fluid collection without significant mass effect or midline shift. Overall similar moderate-to-severe scattered white matter hypodensities which are nonspecific but most commonly represent chronic microvascular ischemic changes. Redemonstrated chronic left occipital lobe infarction. ORBITS: Bilateral lens replacement. SINUSES: No acute abnormality. SOFT TISSUES AND SKULL: Decreased left parietal hematoma and soft tissue swelling. Right temporomandibular joint arthrosis. Partially imaged chronic cervical spine fractures. No skull fracture. IMPRESSION: 1. Since 10/23/2023, new 0.8 cm hypoattenuating left cerebral convexity extra-axial fluid collection, without significant mass effect or midline shift, likely representing subdural hematoma, possibly acute. 2. The above  finding was communicated to [facility] by phone at 3:25 PM. Electronically signed by: prentice bybordi 03/31/2024 03:27 PM EST RP Workstation: [id number]   CT Cervical Spine Wo Contrast Result Date: 03/31/2024 CLINICAL DATA:  Fall. EXAM: CT CERVICAL SPINE WITHOUT CONTRAST TECHNIQUE:  Multidetector CT imaging of the cervical spine was performed without intravenous contrast. Multiplanar CT image reconstructions were also generated. RADIATION DOSE REDUCTION: This exam was performed according to the departmental dose-optimization program which includes automated exposure control, adjustment of the mA and/or kV according to patient size and/or use of iterative reconstruction technique. COMPARISON:  10/23/2023 FINDINGS: Alignment: No acute posttraumatic subluxation. Skull base and vertebrae: Moderate spondylosis throughout the cervical spine to include uncovertebral joint spurring and facet arthropathy. Exam demonstrates a stable non healed type 2 dens fracture with 3 mm distraction of the fracture fragments. Stable symmetric fractures of the bilateral posterior arch of C1. Stable fracture along the T1 anterior osteophyte. No new fractures identified. Anterior fusion hardware intact and unchanged at the C3-4 level. 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 These findings are stable. Bilateral neural foraminal narrowing at the C3-4 level and to lesser extent at the C4-5 level. Neural foraminal narrowing bilaterally at the C5-6 level right worse than left. Minimal right-sided neural foraminal narrowing at the C6-7 level. Soft tissues and spinal canal: Prevertebral soft tissues are unremarkable. Disc levels: Obliteration of the disc space at the C4-5 and C5-6 levels with disc space narrowing at the C6-7 and C7-T1 levels. Upper chest: No acute findings. Other: None. IMPRESSION: 1. No acute findings. 2. Stable non healed type 2 dens fracture with 3 mm distraction of the fracture fragments. Stable symmetric fractures of the bilateral posterior arch of C1. Stable fracture along the T1 anterior osteophyte. 3. Stable anterior fusion hardware at the C3-4 level. 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 4. Moderate spondylosis throughout the cervical spine  with multilevel disc disease and neural foraminal narrowing as described. Electronically Signed   By: Toribio Agreste M.D.   On: 03/31/2024 14:56    Scheduled Meds:    acetaminophen   1,000 mg Oral TID   docusate sodium   100 mg Oral BID   folic acid   1 mg Oral Daily   lamoTRIgine   200 mg Oral QHS   lidocaine   1 patch Transdermal Q24H   memantine   10 mg Oral BID   multivitamin with minerals  1 tablet Oral Daily   pantoprazole  (PROTONIX ) IV  40 mg Intravenous Q12H   pregabalin   100 mg Oral BID   primidone   100 mg Oral BID   thiamine   100 mg Oral Daily    Continuous Infusions:       LOS: 1 day     Trenda Mar, MD,  FACP, [facility], [facility], [facility]   [facility] [place]      To contact the attending provider between 7A-7P or the covering provider during after hours 7P-7A, please log into the web site [email or web address] and access using universal [place] password for that web site. If you do not have the password, please call the hospital operator.  04/02/2024, 1:32 PM

## 2024-04-02 NOTE — Progress Notes (Signed)
  Echocardiogram 2D Echocardiogram has been performed.  Gabriel Richardson, RDCS 04/02/2024, 2:29 PM

## 2024-04-02 NOTE — Procedures (Signed)
 Patient Name: Gabriel Richardson  MRN: [id number]  Epilepsy Attending: Arlin MALVA Krebs  Referring Physician/Provider: Judeth Trenda BIRCH, MD  Date: 04/02/2024 Duration: 25.38 mins  Patient history: 78 year old male with syncope.  EEG evaluate for seizure.  Level of alertness: Awake  AEDs during EEG study: LTG, PGN  Technical aspects: This EEG study was done with scalp electrodes positioned according to the 10-20 International system of electrode placement. Electrical activity was reviewed with band pass filter of 1-70Hz , sensitivity of 7 uV/mm, display speed of 57mm/sec with a 60Hz  notched filter applied as appropriate. EEG data were recorded continuously and digitally stored.  Video monitoring was available and reviewed as appropriate.  Description: EEG showed continuous generalized predominantly 6 to 7 Hz theta slowing admixed with intermittent generalized 2 to 3 Hz delta slowing.  Hyperventilation and photic stimulation were not performed.     ABNORMALITY - Continuous slow, generalized  IMPRESSION: This study is suggestive of diffuse cerebral dysfunction (encephalopathy).  No seizures or epileptiform discharges were seen throughout the recording.  Gabriel Richardson

## 2024-04-02 NOTE — Progress Notes (Signed)
 EEG complete - results pending

## 2024-04-03 LAB — CBC
HCT: 23.4 % — ABNORMAL LOW (ref 39.0–52.0)
Hemoglobin: 8 g/dL — ABNORMAL LOW (ref 13.0–17.0)
MCH: 35.4 pg — ABNORMAL HIGH (ref 26.0–34.0)
MCHC: 34.2 g/dL (ref 30.0–36.0)
MCV: 103.5 fL — ABNORMAL HIGH (ref 80.0–100.0)
Platelets: 136 K/uL — ABNORMAL LOW (ref 150–400)
RBC: 2.26 MIL/uL — ABNORMAL LOW (ref 4.22–5.81)
RDW: 13 % (ref 11.5–15.5)
WBC: 11 K/uL — ABNORMAL HIGH (ref 4.0–10.5)
nRBC: 0 % (ref 0.0–0.2)

## 2024-04-03 LAB — COMPREHENSIVE METABOLIC PANEL WITH GFR
ALT: 17 U/L (ref 0–44)
AST: 24 U/L (ref 15–41)
Albumin: 2.9 g/dL — ABNORMAL LOW (ref 3.5–5.0)
Alkaline Phosphatase: 69 U/L (ref 38–126)
Anion gap: 10 (ref 5–15)
BUN: 11 mg/dL (ref 8–23)
CO2: 25 mmol/L (ref 22–32)
Calcium: 8.1 mg/dL — ABNORMAL LOW (ref 8.9–10.3)
Chloride: 97 mmol/L — ABNORMAL LOW (ref 98–111)
Creatinine, Ser: 0.71 mg/dL (ref 0.61–1.24)
GFR, Estimated: >60 mL/min (ref >60)
Glucose, Bld: 91 mg/dL (ref 70–99)
Potassium: 3.9 mmol/L (ref 3.5–5.1)
Sodium: 132 mmol/L — ABNORMAL LOW (ref 135–145)
Total Bilirubin: 1.6 mg/dL — ABNORMAL HIGH (ref 0.0–1.2)
Total Protein: 6 g/dL — ABNORMAL LOW (ref 6.5–8.1)

## 2024-04-03 NOTE — Plan of Care (Incomplete)
 PT D/C home with friend. PT AVS reviewed no follow up questions. IV removed BP (!) 107/59 (BP Location: Left Arm)   Pulse 89   Temp 99.1 F (37.3 C) (Oral)   Resp 13   Ht 5' 10 (1.778 m)   Wt 85.8 kg   SpO2 99%   BMI 27.14 kg/m  Pt bathed and fed and given morning medication. No other needs voiced at this time.  Gabriel Richardson 04/03/24

## 2024-04-03 NOTE — Progress Notes (Signed)
 No charge [facility] Progress Note  Please refer to detailed discharge summary done by this MD for 04/02/2024.  Patient insisted on discharging home on 11/14.  All his discharge paperwork was thereby completed yesterday but unfortunately his family/friends were unable to pick him up and hence he stayed overnight.  By the time I went to patient's floor this morning.  Patient had already been picked up by his family and I did not get to see him.  Updated DC summary from yesterday  Trenda Mar, MD,  FACP, [facility], [facility], [facility]   [facility] [place]     To contact the attending provider between 7A-7P or the covering provider during after hours 7P-7A, please log into the web site [email or web address] and access using universal [place] password for that web site. If you do not have the password, please call the hospital operator.

## 2024-04-03 NOTE — Progress Notes (Addendum)
 Occupational Therapy Treatment Patient Details Name: Gabriel Richardson MRN: [id number] DOB: 1946-01-17 Today's Date: 04/03/2024   History of present illness 78 yo male presents to ED 11/12 s/p fall, found down by friend several hours later.  CTH shows a left convexity SDH likely subacute to chronic in nature. He also has a known stable C2 fracture with non-union. CTCAP shows an acute vertebral body fracture of T6 plus a chronic appearing fracture of T8. PMH includes: T2-3 laminectomy 2022, R ankle fx and surgical repair, anxiety, arthritis, HF, MI, alcohol abuse, opioid abuse, bipolar disorder, COPD, Emphysema, and cervical fusion C3-5.   OT comments  Pt. Seen for skilled OT treatment session.  Bed mobility min a.  CGA for sit/stand but required more assistance during RW use to/from b.room.  max a to don brace. Max cues throughout session for back precautions and safety during mobility. Pt. With poor insight and carryover of precautions during all mobility and ADLs.  Pt. would often not answer or respond without multiple attempts to engage pt. He would then state or repeat what I had asked him But then not utilize precautions or recommendations for Rw safety during ambulation.  I discussed our recommendations For 24/7 support at home.  Pt. Reports he has support but not all day.  Reviewed safety concerns with pt. But he states that everything is real close by where I need it and she has my food ordered and delivered every day.  Agree with recommendations of 24/7 S and HHOT for continued efforts with increasing safety with mobility and adls due to cognitive status, level of physical A needed for LB ADLs, and poor insight/carryover with back precautions.         If plan is discharge home, recommend the following:  A lot of help with walking and/or transfers;A lot of help with bathing/dressing/bathroom;Assistance with cooking/housework;Assist for transportation;Supervision due to cognitive status    Equipment Recommendations  None recommended by OT    Recommendations for Other Services      Precautions / Restrictions Precautions Precautions: Fall;Back Precaution/Restrictions Comments: reviewed use of TLSO, application of TLSO, back precutions Required Braces or Orthoses: Spinal Brace Spinal Brace: Thoracolumbosacral orthotic;Applied in sitting position       Mobility Bed Mobility Overal bed mobility: Needs Assistance Bed Mobility: Rolling, Sidelying to Sit Rolling: Min assist Sidelying to sit: Min assist       General bed mobility comments: assist for log roll to EOB, pt using bedrails to perform-mutliple attempts at twisting to reach opposing rail, max cues not to twist or touch that rail    Transfers Overall transfer level: Needs assistance Equipment used: Rolling walker (2 wheels) Transfers: Sit to/from Stand, Bed to chair/wheelchair/BSC Sit to Stand: Contact guard assist     Step pivot transfers: Min assist     General transfer comment: for safety, slow to rise and steady. cues for correct hand placement not followed     Balance                                           ADL either performed or assessed with clinical judgement   ADL Overall ADL's : Needs assistance/impaired     Grooming: Wash/dry hands;Wash/dry face;Set up;Sitting           Upper Body Dressing : Maximal assistance;Sitting;Minimal assistance Upper Body Dressing Details (indicate cue type and reason): brace, unaware  how to don after reviewed, still unable to state the steps for donning brace, min a gown Lower Body Dressing: Sitting/lateral leans;Moderate assistance Lower Body Dressing Details (indicate cue type and reason): able to achieve figure four but not reach socks, indicates he has help for these tasks Toilet Transfer: Minimal assistance;Ambulation;Rolling walker (2 wheels);Regular Teacher, Adult Education Details (indicate cue type and reason): amb. to/from  b.room for sponge bath, max cues for rw management Toileting- Clothing Manipulation and Hygiene: Sit to/from stand Toileting - Clothing Manipulation Details (indicate cue type and reason): observed during LB bathing-buttocks, cues not to twist     Functional mobility during ADLs: Minimal assistance;Rolling walker (2 wheels);Cueing for safety;Cueing for sequencing General ADL Comments: maximal cues needed for spinal precuations with little to no carryover noted. pt has poor insight to safety    Extremity/Trunk Assessment              Vision       Perception     Praxis     Communication Communication Communication: No apparent difficulties   Cognition Arousal: Alert Behavior During Therapy: WFL for tasks assessed/performed, Impulsive Cognition: No family/caregiver present to determine baseline. Often stare at therapist asst. After a question or command was provided. Required multiple times of repeating. Pt. Would then require additional cues for sequencing and not complete consistently or safely despite max cues and instructions.  States well I'm always a little foggy when I sit up (this was after being up and washing up and returning to recliner).               OT - Cognition Comments: pt unaware of his new back fracture, can be verbose and self-distracting, needed cues for safety and spinal precautions.                   Following commands impaired: Follows one step commands inconsistently, Follows multi-step commands inconsistently, Follows multi-step commands with increased time, Follows one step commands with increased time      Cueing   Cueing Techniques: Verbal cues  Exercises      Shoulder Instructions       General Comments      Pertinent Vitals/ Pain       Pain Assessment Pain Assessment: No/denies pain  Home Living Family/patient expects to be discharged to:: Private residence Living Arrangements: Alone;Other (Comment)                                       Prior Functioning/Environment              Frequency  Min 2X/week        Progress Toward Goals  OT Goals(current goals can now be found in the care plan section)  Progress towards OT goals: Progressing toward goals     Plan      Co-evaluation                 AM-PAC OT 6 Clicks Daily Activity     Outcome Measure   Help from another person eating meals?: None Help from another person taking care of personal grooming?: A Little Help from another person toileting, which includes using toliet, bedpan, or urinal?: A Little Help from another person bathing (including washing, rinsing, drying)?: A Lot Help from another person to put on and taking off regular upper body clothing?: A Little Help from another person to put on  and taking off regular lower body clothing?: A Lot 6 Click Score: 17    End of Session Equipment Utilized During Treatment: Rolling walker (2 wheels);Back brace  OT Visit Diagnosis: Unsteadiness on feet (R26.81);Other abnormalities of gait and mobility (R26.89);Repeated falls (R29.6);Muscle weakness (generalized) (M62.81);History of falling (Z91.81);Pain   Activity Tolerance Patient tolerated treatment well   Patient Left in chair;with call bell/phone within reach;with chair alarm set   Nurse Communication Mobility status        Time: 9065-8998 OT Time Calculation (min): 27 min  Charges: OT General Charges $OT Visit: 1 Visit OT Treatments $Self Care/Home Management : 23-37 mins  Randall, COTA/L Acute Rehabilitation [phone]   CHRISTELLA Nest Lorraine-COTA/L  04/03/2024, 10:11 AM

## 2024-04-03 NOTE — TOC Transition Note (Signed)
 Transition of Care [facility]) - Discharge Note   Patient Details  Name: Gabriel Richardson MRN: [id number] Date of Birth: July 18, 1945  Transition of Care [facility]) CM/SW Contact:  Robynn Eileen Hoose, RN Phone Number: 04/03/2024, 8:45 AM   Clinical Narrative:   Patient is being discharged. Glenda with [facility] made aware.    Final next level of care: Home w Home Health Services Barriers to Discharge: Barriers Resolved   Patient Goals and CMS Choice   CMS Medicare.gov Compare Post Acute Care list provided to:: Patient Represenative (must comment) (POA) Choice offered to / list presented to : [facility] POA / Guardian      Discharge Placement                       Discharge Plan and Services Additional resources added to the After Visit Summary for     Discharge Planning Services: CM Consult Post Acute Care Choice: Home Health                    HH Arranged: PT, OT, RN [facility] Agency: [facility] Date [facility] Agency Contacted: 04/02/24 Time HH Agency Contacted: 1626 Representative spoke with at [facility] Agency: Arna  Social Drivers of Health (SDOH) Interventions SDOH Screenings   Food Insecurity: No Food Insecurity (10/01/2023)  Housing: Low Risk  (10/01/2023)  Transportation Needs: No Transportation Needs (10/01/2023)  Utilities: Not At Risk (10/01/2023)  Alcohol Screen: Medium Risk (10/15/2023)  Depression (PHQ2-9): Low Risk  (10/01/2023)  Financial Resource Strain: Low Risk  (09/10/2022)   Received from [facility]  Tobacco Use: Medium Risk (03/31/2024)     Readmission Risk Interventions     No data to display

## 2024-04-04 DIAGNOSIS — S22059A Unspecified fracture of T5-T6 vertebra, initial encounter for closed fracture: Secondary | ICD-10-CM | POA: Diagnosis not present

## 2024-04-05 ENCOUNTER — Other Ambulatory Visit: Payer: Self-pay | Admitting: Cardiology

## 2024-04-05 ENCOUNTER — Ambulatory Visit: Attending: Internal Medicine

## 2024-04-05 DIAGNOSIS — R55 Syncope and collapse: Secondary | ICD-10-CM

## 2024-04-05 NOTE — Progress Notes (Unsigned)
 Enrolled for [employer] to mail a ZIO XT long term holter monitor to the patients address on file.   Dr. Jacques Navy to read.

## 2024-04-05 NOTE — Progress Notes (Signed)
 14d zio monitor ordered for syncope Dr. Loni to read

## 2024-04-08 ENCOUNTER — Telehealth: Payer: Self-pay

## 2024-04-08 NOTE — Progress Notes (Signed)
..  Complex Care Management   04/08/2024  Name: Gabriel Richardson  MRN: [id number]  DOB: [birth year]  Inbound call received from [name] after receiving an Interactive Voice Response (IVR) call after a recent hospitalization. Information was provided about Care Management services.  Follow up plan: No further follow up required  He stated he does have a follow up appointment with his Primary Care Physician.   [name], [name]  [facility], [facility] Health Care Management Assistant [phone]

## 2024-04-10 ENCOUNTER — Other Ambulatory Visit: Payer: Self-pay | Admitting: Neurology

## 2024-04-14 DIAGNOSIS — F1020 Alcohol dependence, uncomplicated: Secondary | ICD-10-CM | POA: Diagnosis not present

## 2024-04-14 DIAGNOSIS — F02A18 Dementia in other diseases classified elsewhere, mild, with other behavioral disturbance: Secondary | ICD-10-CM | POA: Diagnosis not present

## 2024-04-14 DIAGNOSIS — I252 Old myocardial infarction: Secondary | ICD-10-CM | POA: Diagnosis not present

## 2024-04-14 DIAGNOSIS — R54 Age-related physical debility: Secondary | ICD-10-CM | POA: Diagnosis not present

## 2024-04-14 DIAGNOSIS — S22059D Unspecified fracture of T5-T6 vertebra, subsequent encounter for fracture with routine healing: Secondary | ICD-10-CM | POA: Diagnosis not present

## 2024-04-14 DIAGNOSIS — K2289 Other specified disease of esophagus: Secondary | ICD-10-CM | POA: Diagnosis not present

## 2024-04-14 DIAGNOSIS — N179 Acute kidney failure, unspecified: Secondary | ICD-10-CM | POA: Diagnosis not present

## 2024-04-14 DIAGNOSIS — D649 Anemia, unspecified: Secondary | ICD-10-CM | POA: Diagnosis not present

## 2024-04-14 DIAGNOSIS — D72829 Elevated white blood cell count, unspecified: Secondary | ICD-10-CM | POA: Diagnosis not present

## 2024-04-20 ENCOUNTER — Other Ambulatory Visit: Payer: Self-pay

## 2024-04-20 MED ORDER — PREGABALIN 100 MG PO CAPS: 100.0000 mg | ORAL_CAPSULE | Freq: Two times a day (BID) | ORAL | 5 refills | Status: DC

## 2024-04-21 ENCOUNTER — Telehealth: Payer: Self-pay | Admitting: Neurology

## 2024-04-21 NOTE — Telephone Encounter (Signed)
 Pt needs Rx  Pregabalin  100 mg Oral 2 times daily refill, no other info provided

## 2024-04-22 NOTE — Telephone Encounter (Signed)
 Rx sent on 12/2

## 2024-04-27 ENCOUNTER — Ambulatory Visit: Admitting: Internal Medicine

## 2024-06-18 ENCOUNTER — Telehealth: Payer: Self-pay

## 2024-06-18 MED ORDER — PREGABALIN 50 MG PO CAPS: 50.0000 mg | ORAL_CAPSULE | Freq: Two times a day (BID) | ORAL | 5 refills | Status: AC

## 2024-06-18 NOTE — Telephone Encounter (Signed)
 We can reduce Lyrica  to 50mg  twice daily.  Updated prescription will be sent.

## 2024-06-18 NOTE — Telephone Encounter (Signed)
 Called Gabriel Richardson and left a message for a call back.

## 2024-06-18 NOTE — Telephone Encounter (Signed)
 Leita a nurse from [facility] called and stated patient is taking Lyrica  100mg  BID and has been having an ongoing amount of drowsiness from this medication. She wants to know if patient needs to take this dose or if we can cut his dose back to once a day.   She would like a call back to further direct patient.

## 2024-06-18 NOTE — Telephone Encounter (Signed)
 Called Leita and left a message for a call back.

## 2024-06-18 NOTE — Addendum Note (Signed)
 Addended by: Ionia Schey K on: 06/18/2024 09:45 AM   Modules accepted: Orders

## 2024-06-29 ENCOUNTER — Ambulatory Visit: Admitting: Physician Assistant

## 2024-08-24 ENCOUNTER — Ambulatory Visit: Admitting: Neurology
